# Patient Record
Sex: Female | Born: 1960
Health system: Southern US, Community
[De-identification: ages and names within clinical notes are randomized; demographics above are authoritative.]

## PROBLEM LIST (undated history)

## (undated) DIAGNOSIS — U071 COVID-19: Secondary | ICD-10-CM

## (undated) DIAGNOSIS — M069 Rheumatoid arthritis, unspecified: Secondary | ICD-10-CM

## (undated) DIAGNOSIS — M1990 Unspecified osteoarthritis, unspecified site: Secondary | ICD-10-CM

## (undated) DIAGNOSIS — C439 Malignant melanoma of skin, unspecified: Secondary | ICD-10-CM

## (undated) HISTORY — PX: SKIN BIOPSY: SHX1

## (undated) HISTORY — PX: INJECTION KNEE: SHX2446

## (undated) HISTORY — DX: COVID-19: U07.1

## (undated) HISTORY — PX: MELANOMA EXCISION: SHX5266

---

## 2000-03-13 ENCOUNTER — Encounter: Payer: Self-pay | Admitting: General Surgery

## 2000-03-13 ENCOUNTER — Ambulatory Visit (HOSPITAL_COMMUNITY): Admission: RE | Admit: 2000-03-13 | Discharge: 2000-03-13 | Payer: Self-pay | Admitting: General Surgery

## 2000-03-20 ENCOUNTER — Encounter: Admission: RE | Admit: 2000-03-20 | Discharge: 2000-03-20 | Payer: Self-pay | Admitting: General Surgery

## 2000-03-20 ENCOUNTER — Encounter: Payer: Self-pay | Admitting: General Surgery

## 2000-03-21 ENCOUNTER — Ambulatory Visit (HOSPITAL_BASED_OUTPATIENT_CLINIC_OR_DEPARTMENT_OTHER): Admission: RE | Admit: 2000-03-21 | Discharge: 2000-03-21 | Payer: Self-pay | Admitting: General Surgery

## 2000-03-21 ENCOUNTER — Encounter (INDEPENDENT_AMBULATORY_CARE_PROVIDER_SITE_OTHER): Payer: Self-pay | Admitting: *Deleted

## 2000-03-31 ENCOUNTER — Emergency Department (HOSPITAL_COMMUNITY): Admission: EM | Admit: 2000-03-31 | Discharge: 2000-03-31 | Payer: Self-pay | Admitting: Emergency Medicine

## 2000-04-17 ENCOUNTER — Encounter: Payer: Self-pay | Admitting: Oncology

## 2000-04-17 ENCOUNTER — Encounter: Admission: RE | Admit: 2000-04-17 | Discharge: 2000-04-17 | Payer: Self-pay | Admitting: Oncology

## 2000-07-17 ENCOUNTER — Other Ambulatory Visit: Admission: RE | Admit: 2000-07-17 | Discharge: 2000-07-17 | Payer: Self-pay | Admitting: Internal Medicine

## 2000-07-23 ENCOUNTER — Encounter: Admission: RE | Admit: 2000-07-23 | Discharge: 2000-07-23 | Payer: Self-pay | Admitting: Oncology

## 2000-07-23 ENCOUNTER — Encounter: Payer: Self-pay | Admitting: Oncology

## 2000-11-19 ENCOUNTER — Encounter: Admission: RE | Admit: 2000-11-19 | Discharge: 2000-11-19 | Payer: Self-pay | Admitting: Oncology

## 2000-11-19 ENCOUNTER — Encounter: Payer: Self-pay | Admitting: Oncology

## 2000-12-09 ENCOUNTER — Encounter: Admission: RE | Admit: 2000-12-09 | Discharge: 2000-12-09 | Payer: Self-pay | Admitting: Internal Medicine

## 2000-12-09 ENCOUNTER — Encounter: Payer: Self-pay | Admitting: Internal Medicine

## 2001-06-11 ENCOUNTER — Encounter: Admission: RE | Admit: 2001-06-11 | Discharge: 2001-06-11 | Payer: Self-pay | Admitting: Dermatology

## 2001-06-11 ENCOUNTER — Encounter: Payer: Self-pay | Admitting: Dermatology

## 2001-12-29 ENCOUNTER — Other Ambulatory Visit: Admission: RE | Admit: 2001-12-29 | Discharge: 2001-12-29 | Payer: Self-pay | Admitting: Obstetrics and Gynecology

## 2002-01-26 ENCOUNTER — Encounter: Admission: RE | Admit: 2002-01-26 | Discharge: 2002-01-26 | Payer: Self-pay | Admitting: Internal Medicine

## 2002-01-26 ENCOUNTER — Encounter: Payer: Self-pay | Admitting: Internal Medicine

## 2002-06-15 ENCOUNTER — Encounter: Payer: Self-pay | Admitting: Oncology

## 2002-06-15 ENCOUNTER — Encounter: Admission: RE | Admit: 2002-06-15 | Discharge: 2002-06-15 | Payer: Self-pay | Admitting: Oncology

## 2003-03-01 ENCOUNTER — Encounter: Admission: RE | Admit: 2003-03-01 | Discharge: 2003-03-01 | Payer: Self-pay | Admitting: Internal Medicine

## 2003-07-09 ENCOUNTER — Other Ambulatory Visit: Admission: RE | Admit: 2003-07-09 | Discharge: 2003-07-09 | Payer: Self-pay | Admitting: Internal Medicine

## 2004-04-05 ENCOUNTER — Encounter: Admission: RE | Admit: 2004-04-05 | Discharge: 2004-04-05 | Payer: Self-pay | Admitting: Internal Medicine

## 2004-04-05 ENCOUNTER — Encounter: Admission: RE | Admit: 2004-04-05 | Discharge: 2004-04-05 | Payer: Self-pay | Admitting: Rheumatology

## 2004-07-10 ENCOUNTER — Other Ambulatory Visit: Admission: RE | Admit: 2004-07-10 | Discharge: 2004-07-10 | Payer: Self-pay | Admitting: Internal Medicine

## 2005-04-29 ENCOUNTER — Emergency Department (HOSPITAL_COMMUNITY): Admission: EM | Admit: 2005-04-29 | Discharge: 2005-04-29 | Payer: Self-pay | Admitting: Family Medicine

## 2005-05-21 ENCOUNTER — Encounter: Admission: RE | Admit: 2005-05-21 | Discharge: 2005-05-21 | Payer: Self-pay | Admitting: Internal Medicine

## 2006-05-29 ENCOUNTER — Encounter: Admission: RE | Admit: 2006-05-29 | Discharge: 2006-05-29 | Payer: Self-pay | Admitting: Obstetrics and Gynecology

## 2007-06-02 ENCOUNTER — Encounter: Admission: RE | Admit: 2007-06-02 | Discharge: 2007-06-02 | Payer: Self-pay | Admitting: Obstetrics and Gynecology

## 2008-06-02 ENCOUNTER — Encounter: Admission: RE | Admit: 2008-06-02 | Discharge: 2008-06-02 | Payer: Self-pay | Admitting: Obstetrics and Gynecology

## 2009-06-06 ENCOUNTER — Encounter: Admission: RE | Admit: 2009-06-06 | Discharge: 2009-06-06 | Payer: Self-pay | Admitting: Obstetrics and Gynecology

## 2009-06-08 ENCOUNTER — Ambulatory Visit (HOSPITAL_COMMUNITY): Admission: RE | Admit: 2009-06-08 | Discharge: 2009-06-08 | Payer: Self-pay | Admitting: Rheumatology

## 2010-07-28 NOTE — Op Note (Signed)
. [facility]  Patient:    Kara Campbell, Kara Campbell                        MRN: [id number] Proc. Date: 03/21/00 Attending:  Rose Phi. Maple Hudson, M.D. CC:         Darius Bump, M.D.  Deanna Artis. Lorenso Quarry, M.D.   Operative Report  PREOPERATIVE DIAGNOSIS:  A 1.9 mm thick melanoma, superficial spreading, in the triceps area of the left upper extremity.  POSTOPERATIVE DIAGNOSIS:  A 1.9 mm thick melanoma, superficial spreading, in the triceps area of the left upper extremity.  PROCEDURES: 1. Blue dye injection. 2. Left axillary sentinel lymph node biopsy. 3. Wide excision of melanoma with layered closure.  SURGEON:  Rose Phi. Maple Hudson, M.D.  ANESTHESIA:  General.  DESCRIPTION OF PROCEDURE:  Two hours preoperatively, the patient had 1 mCi of Technetium sulfur colloid injected intradermally around the melanoma of the triceps area of the left upper extremity.  After suitable general anesthesia was induced, about 0.5 cc of lymphazurin blue was injected intradermally around the melanoma, and then we prepped out the breast, axilla, and the whole upper extremity.  With the NeoProbe, we were able to identify a hot spot in the left axilla where the preoperative lymphoscintigram had identified a sentinel lymph node.  A short transverse incision was made in the left axilla and with dissection through the subcutaneous tissue to the clavipectoral fascia.  A self-retaining retractor was inserted.  The clavipectoral fascia was incised, and we could see a blue lymphatic leading to a pink node with blue right where the lymphatic went into it.  This node was also quite hot with the NeoProbe.  It was removed and then careful scanning and palpation revealed no other blue or hot or palpable lymph nodes in the axilla.  With that node removed, we closed the deep tissue with 3-0 Vicryl and the skin with subcuticular 4-0 Monocryl and Steri-Strips.  We repositioned the arm so we  could get to where the lesion was and measured a 1.5-2 cm margin around it and made a vertical elliptical incision and excised the melanoma in the subcutaneous tissue down to the triceps fascia. Hemostasis obtained with the cautery.  The deeper tissue was then closed with 3-0 Vicryl and the skin with interrupted 4-0 nylon sutures.  A dressing was applied.  Patient transferred to the recovery room in satisfactory condition, having tolerated the procedure well. DD:  03/21/00 TD:  03/21/00 Job: [id number]

## 2012-05-14 ENCOUNTER — Emergency Department (HOSPITAL_COMMUNITY)
Admission: EM | Admit: 2012-05-14 | Discharge: 2012-05-14 | Disposition: A | Discharge status: Home / Self Care | Payer: 59 | Source: Home / Self Care

## 2012-05-14 ENCOUNTER — Encounter (HOSPITAL_COMMUNITY): Payer: Self-pay

## 2012-05-14 DIAGNOSIS — H6120 Impacted cerumen, unspecified ear: Secondary | ICD-10-CM

## 2012-05-14 DIAGNOSIS — H6122 Impacted cerumen, left ear: Secondary | ICD-10-CM

## 2012-05-14 HISTORY — DX: Malignant melanoma of skin, unspecified: C43.9

## 2012-05-14 HISTORY — DX: Unspecified osteoarthritis, unspecified site: M19.90

## 2012-05-14 NOTE — ED Provider Notes (Signed)
History     CSN: [id number]  Arrival date & time 05/14/12  1002   First MD Initiated Contact with Patient 05/14/12 1022      Chief Complaint  Patient presents with  . Cerumen Impaction    (Consider location/radiation/quality/duration/timing/severity/associated sxs/prior treatment) HPI Comments: 52 year old female presents with a single complained of wax in the left ear for 5 days. This is produced decreased hearing in the left ear only. She denies symptoms of the right year. Her review systems is otherwise negative she does not feel he will she has no pain, fever, chills, weakness or malaise. She states that she feels quite well.  The history is provided by the patient.    Past Medical History  Diagnosis Date  . Melanoma rhe  . Arthritis     History reviewed. No pertinent past surgical history.  History reviewed. No pertinent family history.  History  Substance Use Topics  . Smoking status: Not on file  . Smokeless tobacco: Not on file  . Alcohol Use: Not on file    OB History   Grav Para Term Preterm Abortions TAB SAB Ect Mult Living                  Review of Systems  All other systems reviewed and are negative.    Allergies  Amoxil  Home Medications   Current Outpatient Rx  Name  Route  Sig  Dispense  Refill  . folic acid (FOLVITE) 1 MG tablet   Oral   Take 1 mg by mouth daily.         . hydroxychloroquine (PLAQUENIL) 200 MG tablet   Oral   Take 200 mg by mouth daily.         . sertraline (ZOLOFT) 25 MG tablet   Oral   Take 25 mg by mouth daily.           BP 177/85  Pulse 70  Temp(Src) 98 F (36.7 C) (Oral)  SpO2 100%  Physical Exam  Constitutional: She is oriented to person, place, and time. She appears well-developed and well-nourished. No distress.  HENT:  Mouth/Throat: Oropharynx is clear and moist.  Left EAC impacted with cerumen. Right EAC is clear TM is normal.  Eyes: Conjunctivae and EOM are normal.  Neck: Normal range  of motion. Neck supple.  Pulmonary/Chest: Effort normal.  Musculoskeletal: Normal range of motion. She exhibits no edema.  Neurological: She is alert and oriented to person, place, and time. She exhibits normal muscle tone.  Skin: Skin is warm and dry.  Psychiatric: She has a normal mood and affect.    ED Course  Procedures (including critical care time)  Labs Reviewed - No data to display No results found.   1. Cerumen impaction, left       MDM  Left ear irrigation till clear. Post irrigation reveals a clear EAC. TM is normal in appearance without erythema or effusion. She is denying pain in the area. States that she is feeling much better.        Hayden Rasmussen, NP 05/14/12 1101  Hayden Rasmussen, NP 05/14/12 1102

## 2012-05-14 NOTE — ED Notes (Signed)
Discomfort, hearing loss in left ear for past few days

## 2012-05-29 ENCOUNTER — Emergency Department (HOSPITAL_COMMUNITY)
Admission: EM | Admit: 2012-05-29 | Discharge: 2012-05-29 | Disposition: A | Discharge status: Home / Self Care | Payer: 59 | Source: Home / Self Care | Attending: Emergency Medicine | Admitting: Emergency Medicine

## 2012-05-29 ENCOUNTER — Encounter (HOSPITAL_COMMUNITY): Payer: Self-pay | Admitting: *Deleted

## 2012-05-29 DIAGNOSIS — J019 Acute sinusitis, unspecified: Secondary | ICD-10-CM

## 2012-05-29 DIAGNOSIS — J209 Acute bronchitis, unspecified: Secondary | ICD-10-CM

## 2012-05-29 MED ORDER — HYDROCOD POLST-CHLORPHEN POLST 10-8 MG/5ML PO LQCR: 5.0000 mL | Freq: Two times a day (BID) | ORAL | Status: DC | PRN

## 2012-05-29 MED ORDER — MOXIFLOXACIN HCL 400 MG PO TABS: 400.0000 mg | ORAL_TABLET | Freq: Every day | ORAL | Status: DC

## 2012-05-29 NOTE — ED Notes (Signed)
Pt reports URI infection for one week with no relief from otc meds - cough, congestion, sneezing

## 2012-05-29 NOTE — ED Provider Notes (Signed)
Chief Complaint:   Chief Complaint  Patient presents with  . 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 She's had a one-week history of cough productive yellow sputum, slight wheezing, and slight chest pain. She's had nasal congestion with clear drainage, sneezing, headache, ear pressure, low-grade fever of not over 100, and sore throat. She denies any GI symptoms.  Review of Systems:  Other than noted above, the patient denies any of the following symptoms: Systemic:  No fevers, chills, sweats, weight loss or gain, fatigue, or tiredness. Eye:  No redness or discharge. ENT:  No ear pain, drainage, headache, nasal congestion, drainage, sinus pressure, difficulty swallowing, or sore throat. Neck:  No neck pain or swollen glands. Lungs:  No cough, sputum production, hemoptysis, wheezing, chest tightness, shortness of breath or chest pain. GI:  No abdominal pain, nausea, vomiting or diarrhea.  PMFSH:  Past medical history, family history, social history, meds, and allergies were reviewed. Physical Exam:   Vital signs:  BP 130/84  Pulse 62  Temp(Src) 98.1 F (36.7 C) (Oral)  Resp 18  SpO2 95% General:  Alert and oriented.  In no distress.  Skin warm and dry. Eye:  No conjunctival injection or drainage. Lids were normal. ENT:  TMs and canals were normal, without erythema or inflammation.  Nasal mucosa was clear and uncongested, without drainage.  Mucous membranes were moist.  Pharynx was clear with no exudate or drainage.  There were no oral ulcerations or lesions. Neck:  Supple, no adenopathy, tenderness or mass. Lungs:  No respiratory distress.  Lungs were clear to auscultation, without wheezes, rales or rhonchi.  Breath sounds were clear and equal bilaterally.  Heart:  Regular rhythm, without gallops, murmers or rubs. Skin:  Clear, warm, and dry, without rash or lesions.  Assessment:  The primary encounter diagnosis  was Acute bronchitis. A diagnosis of Acute sinusitis was also pertinent to this visit.  Plan:   1.  The following meds were prescribed:   Discharge Medication List as of 05/29/2012 10:31 AM    START taking these medications   Details  chlorpheniramine-HYDROcodone (TUSSIONEX) 10-8 MG/5ML LQCR Take 5 mLs by mouth every 12 (twelve) hours as needed., Starting 05/29/2012, Until Discontinued, Normal    moxifloxacin (AVELOX) 400 MG tablet Take 1 tablet (400 mg total) by mouth daily., Starting 05/29/2012, Until Discontinued, Normal       2.  The patient was instructed in symptomatic care and handouts were given. 3.  The patient was told to return if becoming worse in any way, if no better in 3 or 4 days, and given some red flag symptoms such as fever, difficulty breathing, chest pain, or intractable vomiting that would indicate earlier return.      Reuben Likes, MD 05/29/12 [phone]

## 2012-06-08 ENCOUNTER — Emergency Department (INDEPENDENT_AMBULATORY_CARE_PROVIDER_SITE_OTHER): Payer: 59

## 2012-06-08 ENCOUNTER — Encounter (HOSPITAL_COMMUNITY): Payer: Self-pay | Admitting: *Deleted

## 2012-06-08 ENCOUNTER — Emergency Department (HOSPITAL_COMMUNITY)
Admission: EM | Admit: 2012-06-08 | Discharge: 2012-06-08 | Disposition: A | Discharge status: Home / Self Care | Payer: 59 | Source: Home / Self Care

## 2012-06-08 DIAGNOSIS — J9801 Acute bronchospasm: Secondary | ICD-10-CM

## 2012-06-08 HISTORY — DX: Rheumatoid arthritis, unspecified: M06.9

## 2012-06-08 MED ORDER — SODIUM CHLORIDE 0.9 % IV SOLN: Freq: Once | INTRAVENOUS | Status: DC

## 2012-06-08 MED ORDER — BECLOMETHASONE DIPROPIONATE 40 MCG/ACT IN AERS: 2.0000 | INHALATION_SPRAY | Freq: Two times a day (BID) | RESPIRATORY_TRACT | Status: DC

## 2012-06-08 MED ORDER — IPRATROPIUM BROMIDE 0.02 % IN SOLN
0.5000 mg | Freq: Once | RESPIRATORY_TRACT | Status: AC
  Administered 2012-06-08: 0.5 mg via RESPIRATORY_TRACT

## 2012-06-08 MED ORDER — ALBUTEROL SULFATE (5 MG/ML) 0.5% IN NEBU
INHALATION_SOLUTION | RESPIRATORY_TRACT | Status: AC
  Filled 2012-06-08: qty 1

## 2012-06-08 MED ORDER — ALBUTEROL SULFATE HFA 108 (90 BASE) MCG/ACT IN AERS: 1.0000 | INHALATION_SPRAY | Freq: Four times a day (QID) | RESPIRATORY_TRACT | Status: DC | PRN

## 2012-06-08 MED ORDER — ALBUTEROL SULFATE (5 MG/ML) 0.5% IN NEBU
5.0000 mg | INHALATION_SOLUTION | Freq: Once | RESPIRATORY_TRACT | Status: AC
  Administered 2012-06-08: 5 mg via RESPIRATORY_TRACT

## 2012-06-08 NOTE — ED Provider Notes (Signed)
History     CSN: [id number]  Arrival date & time 06/08/12  1155   First MD Initiated Contact with Patient 06/08/12 1430      Chief Complaint  Patient presents with  . Cough    (Consider location/radiation/quality/duration/timing/severity/associated sxs/prior treatment) HPI Comments: As above. The patient states she does feel better after taking the Avelox. Her cough however is increasing. It is nonproductive. She denies having fever but occasionally feels hot. Her PMD has also decreased. She has taken all of her Avelox and the Tussionex is not abating the cough.   Past Medical History  Diagnosis Date  . Melanoma rhe  . Arthritis   . Rheumatoid arthritis     No past surgical history on file.  Family History  Problem Relation Age of Onset  . Rheum arthritis Other     History  Substance Use Topics  . Smoking status: Never Smoker   . Smokeless tobacco: Not on file  . Alcohol Use: Yes     Comment: social    OB History   Grav Para Term Preterm Abortions TAB SAB Ect Mult Living                  Review of Systems  Constitutional: Negative for fever, chills, activity change, appetite change and fatigue.  HENT: Positive for congestion and postnasal drip. Negative for facial swelling, rhinorrhea, neck pain and neck stiffness.   Eyes: Negative.   Respiratory: Positive for cough. Negative for shortness of breath.   Cardiovascular: Negative.   Gastrointestinal: Negative.   Skin: Negative for pallor and rash.  Neurological: Negative.   Psychiatric/Behavioral: Negative.     Allergies  Amoxil  Home Medications   Current Outpatient Rx  Name  Route  Sig  Dispense  Refill  . chlorpheniramine-HYDROcodone (TUSSIONEX) 10-8 MG/5ML LQCR   Oral   Take 5 mLs by mouth every 12 (twelve) hours as needed.   140 mL   0   . folic acid (FOLVITE) 1 MG tablet   Oral   Take 1 mg by mouth daily.         . hydroxychloroquine (PLAQUENIL) 200 MG tablet   Oral   Take 200 mg by  mouth daily.         Marland Kitchen albuterol (PROVENTIL HFA;VENTOLIN HFA) 108 (90 BASE) MCG/ACT inhaler   Inhalation   Inhale 1-2 puffs into the lungs every 6 (six) hours as needed for wheezing.   1 Inhaler   0   . beclomethasone (QVAR) 40 MCG/ACT inhaler   Inhalation   Inhale 2 puffs into the lungs 2 (two) times daily.   1 Inhaler   1   . moxifloxacin (AVELOX) 400 MG tablet   Oral   Take 1 tablet (400 mg total) by mouth daily.   10 tablet   0   . sertraline (ZOLOFT) 25 MG tablet   Oral   Take 25 mg by mouth daily.           BP 141/92  Pulse 62  Temp(Src) 98.2 F (36.8 C) (Oral)  Resp 18  SpO2 100%  Physical Exam  Nursing note and vitals reviewed. Constitutional: She is oriented to person, place, and time. She appears well-developed and well-nourished. No distress.  HENT:  Right Ear: External ear normal.  Left Ear: External ear normal.  Mouth/Throat: Oropharynx is clear and moist. No oropharyngeal exudate.  Eyes: Conjunctivae and EOM are normal.  Neck: Normal range of motion. Neck supple.  Cardiovascular: Normal rate and  regular rhythm.   Pulmonary/Chest: Effort normal. No respiratory distress. She exhibits no tenderness.  End expiratory wheeze with forced expiration and cough. No Rales  Musculoskeletal: Normal range of motion. She exhibits no edema.  Lymphadenopathy:    She has no cervical adenopathy.  Neurological: She is alert and oriented to person, place, and time.  Skin: Skin is warm and dry. No rash noted.  Psychiatric: She has a normal mood and affect.    ED Course  Procedures (including critical care time)  Labs Reviewed - No data to display Dg Chest 2 View  06/08/2012  *RADIOLOGY REPORT*  Clinical Data: Cough  CHEST - 2 VIEW  Comparison:  06/08/2009  Findings:  The heart size and mediastinal contours are within normal limits.  Both lungs are clear.  The visualized skeletal structures are unremarkable.  IMPRESSION: No active cardiopulmonary disease.    Original Report Authenticated By: Judie Petit. Shick, M.D.      1. Cough due to bronchospasm       MDM  Patient states he feels some improvement post DuoNeb. She has continues to have a dry cough but not quite as much. She is breathing well. Albuterol HFA 2 puffs every 4-6 hours when necessary cough Qvar 40 puffs twice a day Followup with your primary care doctor later this week.        Hayden Rasmussen, NP 06/08/12 1527

## 2012-06-08 NOTE — ED Notes (Signed)
Breathing treatment in progress

## 2012-06-08 NOTE — ED Notes (Signed)
To XR dept.

## 2012-06-08 NOTE — ED Notes (Signed)
Was seen in [facility] 3/20 for sinusitis and bronchitis.  Completed Avalox abx yesterday, but feels she has not improved at all.  Feels feverish in evenings.  Has mostly dry cough, but occasionally productive in morning.  Continues taking Rx cough med.  Faint squeak noted upon ausculation of left mid lung.

## 2012-06-11 NOTE — ED Provider Notes (Signed)
Medical screening examination/treatment/procedure(s) were performed by resident physician or non-physician practitioner and as supervising physician I was immediately available for consultation/collaboration.   Donae Kueker DOUGLAS MD.   Megahn Killings D Ranata Laughery, MD 06/11/12 1946 

## 2013-04-16 ENCOUNTER — Other Ambulatory Visit (HOSPITAL_COMMUNITY): Payer: Self-pay | Admitting: Obstetrics and Gynecology

## 2013-04-16 DIAGNOSIS — Z803 Family history of malignant neoplasm of breast: Secondary | ICD-10-CM

## 2013-04-23 ENCOUNTER — Ambulatory Visit (HOSPITAL_COMMUNITY): Admission: RE | Admit: 2013-04-23 | Payer: 59 | Source: Ambulatory Visit

## 2013-04-23 ENCOUNTER — Other Ambulatory Visit: Payer: Self-pay | Admitting: Obstetrics and Gynecology

## 2013-04-23 ENCOUNTER — Ambulatory Visit
Admission: RE | Admit: 2013-04-23 | Discharge: 2013-04-23 | Disposition: A | Discharge status: Home / Self Care | Payer: 59 | Source: Ambulatory Visit | Attending: Obstetrics and Gynecology | Admitting: Obstetrics and Gynecology

## 2013-04-23 ENCOUNTER — Ambulatory Visit
Admission: RE | Admit: 2013-04-23 | Discharge: 2013-04-23 | Disposition: A | Discharge status: Home / Self Care | Payer: Self-pay | Source: Ambulatory Visit | Attending: Obstetrics and Gynecology | Admitting: Obstetrics and Gynecology

## 2013-04-23 DIAGNOSIS — N63 Unspecified lump in unspecified breast: Secondary | ICD-10-CM

## 2013-10-07 ENCOUNTER — Encounter: Payer: Self-pay | Admitting: *Deleted

## 2015-03-26 DIAGNOSIS — S92505A Nondisplaced unspecified fracture of left lesser toe(s), initial encounter for closed fracture: Secondary | ICD-10-CM | POA: Diagnosis not present

## 2015-04-21 MED FILL — SERTRALINE HCL 100 MG TAB: 100 | 30 days supply | Qty: 30 | Fill #5

## 2015-05-26 DIAGNOSIS — H524 Presbyopia: Secondary | ICD-10-CM | POA: Diagnosis not present

## 2015-05-26 DIAGNOSIS — Z79899 Other long term (current) drug therapy: Secondary | ICD-10-CM | POA: Diagnosis not present

## 2015-05-27 MED FILL — SERTRALINE HCL 100 MG TAB: 100 | 30 days supply | Qty: 30 | Fill #6

## 2015-05-27 MED FILL — HYDROXYCHLOROQUINE 200 MG T: 200 | 30 days supply | Qty: 45 | Fill #0

## 2015-06-02 DIAGNOSIS — Z09 Encounter for follow-up examination after completed treatment for conditions other than malignant neoplasm: Secondary | ICD-10-CM | POA: Diagnosis not present

## 2015-06-02 DIAGNOSIS — M19271 Secondary osteoarthritis, right ankle and foot: Secondary | ICD-10-CM | POA: Diagnosis not present

## 2015-06-02 DIAGNOSIS — M205X1 Other deformities of toe(s) (acquired), right foot: Secondary | ICD-10-CM | POA: Diagnosis not present

## 2015-06-02 DIAGNOSIS — M0579 Rheumatoid arthritis with rheumatoid factor of multiple sites without organ or systems involvement: Secondary | ICD-10-CM | POA: Diagnosis not present

## 2015-06-02 DIAGNOSIS — Z79899 Other long term (current) drug therapy: Secondary | ICD-10-CM | POA: Diagnosis not present

## 2015-06-07 ENCOUNTER — Other Ambulatory Visit: Payer: Self-pay | Admitting: *Deleted

## 2015-06-07 ENCOUNTER — Ambulatory Visit: Payer: 59

## 2015-06-07 DIAGNOSIS — R0989 Other specified symptoms and signs involving the circulatory and respiratory systems: Secondary | ICD-10-CM

## 2015-06-07 LAB — INFLUENZA A AND B
INFLUENZA B AG, EIA: NEGATIVE
Influenza A Ag, EIA: NEGATIVE

## 2015-06-15 ENCOUNTER — Ambulatory Visit (INDEPENDENT_AMBULATORY_CARE_PROVIDER_SITE_OTHER): Payer: 59 | Admitting: Urgent Care

## 2015-06-15 VITALS — BP 138/94 | HR 65 | Temp 97.8°F | Resp 17 | Ht 65.5 in | Wt 150.0 lb

## 2015-06-15 DIAGNOSIS — R059 Cough, unspecified: Secondary | ICD-10-CM

## 2015-06-15 DIAGNOSIS — R0982 Postnasal drip: Secondary | ICD-10-CM

## 2015-06-15 DIAGNOSIS — J329 Chronic sinusitis, unspecified: Secondary | ICD-10-CM | POA: Diagnosis not present

## 2015-06-15 DIAGNOSIS — I10 Essential (primary) hypertension: Secondary | ICD-10-CM

## 2015-06-15 DIAGNOSIS — H00016 Hordeolum externum left eye, unspecified eyelid: Secondary | ICD-10-CM

## 2015-06-15 DIAGNOSIS — R05 Cough: Secondary | ICD-10-CM

## 2015-06-15 MED ORDER — HYDROCODONE-HOMATROPINE 5-1.5 MG/5ML PO SYRP: 5.0000 mL | ORAL_SOLUTION | Freq: Every evening | ORAL | Status: DC | PRN

## 2015-06-15 MED ORDER — HYDROCHLOROTHIAZIDE 12.5 MG PO TABS: 12.5000 mg | ORAL_TABLET | Freq: Every day | ORAL | Status: DC

## 2015-06-15 MED ORDER — DOXYCYCLINE HYCLATE 100 MG PO CAPS
100.0000 mg | ORAL_CAPSULE | Freq: Two times a day (BID) | ORAL | Status: DC
Start: 2015-06-15 — End: 2016-02-23

## 2015-06-15 MED ORDER — CETIRIZINE HCL 10 MG PO TABS: 10.0000 mg | ORAL_TABLET | Freq: Every day | ORAL | Status: DC

## 2015-06-15 MED FILL — HYDROCODONE-HOMATROPINE SYR: 5-1.5 | 24 days supply | Qty: 120 | Fill #0

## 2015-06-15 MED FILL — predniSONE 5 MG TABS: 5 | 20 days supply | Qty: 42 | Fill #0

## 2015-06-15 MED FILL — HYDROCHLOROTHIAZIDE 12.5 MG: 12.5 | 90 days supply | Qty: 90 | Fill #0

## 2015-06-15 NOTE — Patient Instructions (Addendum)
Cough, Adult Coughing is a reflex that clears your throat and your airways. Coughing helps to heal and protect your lungs. It is normal to cough occasionally, but a cough that happens with other symptoms or lasts a long time may be a sign of a condition that needs treatment. A cough may last only 2-3 weeks (acute), or it may last longer than 8 weeks (chronic). CAUSES Coughing is commonly caused by:  Breathing in substances that irritate your lungs.  A viral or bacterial respiratory infection.  Allergies.  Asthma.  Postnasal drip.  Smoking.  Acid backing up from the stomach into the esophagus (gastroesophageal reflux).  Certain medicines.  Chronic lung problems, including COPD (or rarely, lung cancer).  Other medical conditions such as heart failure. HOME CARE INSTRUCTIONS  Pay attention to any changes in your symptoms. Take these actions to help with your discomfort:  Take medicines only as told by your health care provider.  If you were prescribed an antibiotic medicine, take it as told by your health care provider. Do not stop taking the antibiotic even if you start to feel better.  Talk with your health care provider before you take a cough suppressant medicine.  Drink enough fluid to keep your urine clear or pale yellow.  If the air is dry, use a cold steam vaporizer or humidifier in your bedroom or your home to help loosen secretions.  Avoid anything that causes you to cough at work or at home.  If your cough is worse at night, try sleeping in a semi-upright position.  Avoid cigarette smoke. If you smoke, quit smoking. If you need help quitting, ask your health care provider.  Avoid caffeine.  Avoid alcohol.  Rest as needed. SEEK MEDICAL CARE IF:   You have new symptoms.  You cough up pus.  Your cough does not get better after 2-3 weeks, or your cough gets worse.  You cannot control your cough with suppressant medicines and you are losing sleep.  You  develop pain that is getting worse or pain that is not controlled with pain medicines.  You have a fever.  You have unexplained weight loss.  You have night sweats. SEEK IMMEDIATE MEDICAL CARE IF:  You cough up blood.  You have difficulty breathing.  Your heartbeat is very fast.   This information is not intended to replace advice given to you by your health care provider. Make sure you discuss any questions you have with your health care provider.   Document Released: 08/25/2010 Document Revised: 11/17/2014 Document Reviewed: 05/05/2014 Elsevier Interactive Patient Education 2016 [employer].    Hypertension Hypertension, commonly called high blood pressure, is when the force of blood pumping through your arteries is too strong. Your arteries are the blood vessels that carry blood from your heart throughout your body. A blood pressure reading consists of a higher number over a lower number, such as 110/72. The higher number (systolic) is the pressure inside your arteries when your heart pumps. The lower number (diastolic) is the pressure inside your arteries when your heart relaxes. Ideally you want your blood pressure below 120/80. Hypertension forces your heart to work harder to pump blood. Your arteries may become narrow or stiff. Having untreated or uncontrolled hypertension can cause heart attack, stroke, kidney disease, and other problems. RISK FACTORS Some risk factors for high blood pressure are controllable. Others are not.  Risk factors you cannot control include:   Race. You may be at higher risk if you are African  American.  Age. Risk increases with age.  Gender. Men are at higher risk than women before age 70 years. After age 19, women are at higher risk than men. Risk factors you can control include:  Not getting enough exercise or physical activity.  Being overweight.  Getting too much fat, sugar, calories, or salt in your diet.  Drinking too much  alcohol. SIGNS AND SYMPTOMS Hypertension does not usually cause signs or symptoms. Extremely high blood pressure (hypertensive crisis) may cause headache, anxiety, shortness of breath, and nosebleed. DIAGNOSIS To check if you have hypertension, your health care provider will measure your blood pressure while you are seated, with your arm held at the level of your heart. It should be measured at least twice using the same arm. Certain conditions can cause a difference in blood pressure between your right and left arms. A blood pressure reading that is higher than normal on one occasion does not mean that you need treatment. If it is not clear whether you have high blood pressure, you may be asked to return on a different day to have your blood pressure checked again. Or, you may be asked to monitor your blood pressure at home for 1 or more weeks. TREATMENT Treating high blood pressure includes making lifestyle changes and possibly taking medicine. Living a healthy lifestyle can help lower high blood pressure. You may need to change some of your habits. Lifestyle changes may include:  Following the DASH diet. This diet is high in fruits, vegetables, and whole grains. It is low in salt, red meat, and added sugars.  Keep your sodium intake below 2,300 mg per day.  Getting at least 30-45 minutes of aerobic exercise at least 4 times per week.  Losing weight if necessary.  Not smoking.  Limiting alcoholic beverages.  Learning ways to reduce stress. Your health care provider may prescribe medicine if lifestyle changes are not enough to get your blood pressure under control, and if one of the following is true:  You are 16-6 years of age and your systolic blood pressure is above 140.  You are 3 years of age or older, and your systolic blood pressure is above 150.  Your diastolic blood pressure is above 90.  You have diabetes, and your systolic blood pressure is over [id number] or your diastolic blood  pressure is over 90.  You have kidney disease and your blood pressure is above 140/90.  You have heart disease and your blood pressure is above 140/90. Your personal target blood pressure may vary depending on your medical conditions, your age, and other factors. HOME CARE INSTRUCTIONS  Have your blood pressure rechecked as directed by your health care provider.   Take medicines only as directed by your health care provider. Follow the directions carefully. Blood pressure medicines must be taken as prescribed. The medicine does not work as well when you skip doses. Skipping doses also puts you at risk for problems.  Do not smoke.   Monitor your blood pressure at home as directed by your health care provider. SEEK MEDICAL CARE IF:   You think you are having a reaction to medicines taken.  You have recurrent headaches or feel dizzy.  You have swelling in your ankles.  You have trouble with your vision. SEEK IMMEDIATE MEDICAL CARE IF:  You develop a severe headache or confusion.  You have unusual weakness, numbness, or feel faint.  You have severe chest or abdominal pain.  You vomit repeatedly.  You have trouble  breathing. MAKE SURE YOU:   Understand these instructions.  Will watch your condition.  Will get help right away if you are not doing well or get worse.   This information is not intended to replace advice given to you by your health care provider. Make sure you discuss any questions you have with your health care provider.   Document Released: 02/26/2005 Document Revised: 07/13/2014 Document Reviewed: 12/19/2012 Elsevier Interactive Patient Education 2016 [employer].     IF you received an x-ray today, you will receive an invoice from [facility]. Please contact [facility] at [phone] with questions or concerns regarding your invoice.   IF you received labwork today, you will receive an invoice from [facility]. Please contact [facility] at [phone] with questions or concerns regarding your invoice.   Our billing staff will not be able to assist you with questions regarding bills from these companies.  You will be contacted with the lab results as soon as they are available. The fastest way to get your results is to activate your My Chart account. Instructions are located on the last page of this paperwork. If you have not heard from [place] regarding the results in 2 weeks, please contact this office.

## 2015-06-15 NOTE — Progress Notes (Signed)
    MRN: [id number] DOB: Nov 16, 1960  Subjective:   Kara Campbell is a 55 y.o. female presenting for chief complaint of Cough; Shortness of Breath; Laryngitis; and chest tightness  Reports 2 week history of dry cough, cough elicits chest tightness, mild wheezing and shob. Has also had sore throat, post-nasal drainage, subjective fever. She is trying Mucinex every four hours, Robitussin at night. Patient works at the [employer] and wants to make sure she does not pass along any infections. Denies history of allergies or asthma. Denies chest pain, n/v, abdominal pain, nasal congestion, sinus pain. Denies smoking cigarettes. Drinks ~4 alcohol drinks per week. Has a history of elevated blood pressure readings without diagnosis HTN. Has never previously started BP medication.  Kara Campbell has a current medication list which includes the following prescription(s): folic acid, hydroxychloroquine, and sertraline. Also is allergic to amoxil.  Kara Campbell  has a past medical history of Melanoma ([place]) (rhe); Arthritis; and Rheumatoid arthritis(714.0). Also  has past surgical history that includes Melanoma excision.  Her family history includes Cancer in her mother; Rheum arthritis in her other. Melanoma in her father.  Objective:   Vitals: BP 138/94 mmHg  Pulse 65  Temp(Src) 97.8 F (36.6 C) (Oral)  Resp 17  Ht 5' 5.5" (1.664 m)  Wt 150 lb (68.04 kg)  BMI 24.57 kg/m2  SpO2 98%  BP Readings from Last 3 Encounters:  06/15/15 138/94  06/08/12 141/92  05/29/12 130/84   Physical Exam  Constitutional: She is oriented to person, place, and time. She appears well-developed and well-nourished.  HENT:  TM's intact bilaterally, no effusions or erythema. Nasal turbinates pink and moist. Mild bilateral maxillary sinus tenderness. Slight postnasal drip present, without oropharyngeal exudates, erythema or abscesses.   Eyes: Right eye exhibits no discharge. Left eye exhibits hordeolum (<0.5cm area of erythema  over inner lower eyelid). Left eye exhibits no discharge. No scleral icterus.  Neck: Normal range of motion. Neck supple.  Cardiovascular: Normal rate, regular rhythm and intact distal pulses.  Exam reveals no gallop and no friction rub.   No murmur heard. Pulmonary/Chest: No respiratory distress. She has no wheezes. She has no rales.  Lymphadenopathy:    She has no cervical adenopathy.  Neurological: She is alert and oriented to person, place, and time.  Skin: Skin is warm and dry.   Assessment and Plan :   1. Cough 2. Post-nasal drainage - Stop Mucinex, start Zyrtec. Use Hycodan as needed. Likely allergic. Provided patient with script for doxycycline which she is to fill in case she has no improvement in 4-5 days. Patient agreed.  3. Essential hypertension - Start HCTZ today, f/u with PCP.  4. Stye, left - Advised warm compresses, rtc if no improvement.   Jaynee Eagles, PA-C [facility] [phone] 06/15/2015 9:33 AM

## 2015-07-04 DIAGNOSIS — Z85828 Personal history of other malignant neoplasm of skin: Secondary | ICD-10-CM | POA: Diagnosis not present

## 2015-07-04 DIAGNOSIS — C44612 Basal cell carcinoma of skin of right upper limb, including shoulder: Secondary | ICD-10-CM | POA: Diagnosis not present

## 2015-07-04 DIAGNOSIS — L089 Local infection of the skin and subcutaneous tissue, unspecified: Secondary | ICD-10-CM | POA: Diagnosis not present

## 2015-07-04 DIAGNOSIS — Z85820 Personal history of malignant melanoma of skin: Secondary | ICD-10-CM | POA: Diagnosis not present

## 2015-07-04 MED FILL — MUPIROCIN 2% OINTMENT: 2 | 10 days supply | Qty: 22 | Fill #0

## 2015-07-14 DIAGNOSIS — D485 Neoplasm of uncertain behavior of skin: Secondary | ICD-10-CM | POA: Diagnosis not present

## 2015-07-14 DIAGNOSIS — C4441 Basal cell carcinoma of skin of scalp and neck: Secondary | ICD-10-CM | POA: Diagnosis not present

## 2015-07-14 DIAGNOSIS — L570 Actinic keratosis: Secondary | ICD-10-CM | POA: Diagnosis not present

## 2015-07-27 MED FILL — HYDROXYCHLOROQUINE 200 MG T: 200 | 84 days supply | Qty: 120 | Fill #0

## 2015-07-28 MED FILL — SERTRALINE HCL 50 MG TABLET: 50 | 30 days supply | Qty: 30 | Fill #0

## 2015-08-02 DIAGNOSIS — L905 Scar conditions and fibrosis of skin: Secondary | ICD-10-CM | POA: Diagnosis not present

## 2015-08-02 DIAGNOSIS — Z85820 Personal history of malignant melanoma of skin: Secondary | ICD-10-CM | POA: Diagnosis not present

## 2015-08-02 DIAGNOSIS — Z85828 Personal history of other malignant neoplasm of skin: Secondary | ICD-10-CM | POA: Diagnosis not present

## 2015-08-02 DIAGNOSIS — C4441 Basal cell carcinoma of skin of scalp and neck: Secondary | ICD-10-CM | POA: Diagnosis not present

## 2015-08-17 DIAGNOSIS — Z1239 Encounter for other screening for malignant neoplasm of breast: Secondary | ICD-10-CM | POA: Diagnosis not present

## 2015-08-17 DIAGNOSIS — Z01419 Encounter for gynecological examination (general) (routine) without abnormal findings: Secondary | ICD-10-CM | POA: Diagnosis not present

## 2015-08-17 DIAGNOSIS — Z1231 Encounter for screening mammogram for malignant neoplasm of breast: Secondary | ICD-10-CM | POA: Diagnosis not present

## 2015-08-17 DIAGNOSIS — Z6826 Body mass index (BMI) 26.0-26.9, adult: Secondary | ICD-10-CM | POA: Diagnosis not present

## 2015-09-06 MED FILL — SERTRALINE HCL 100 MG TAB: 100 | 30 days supply | Qty: 30 | Fill #0

## 2015-11-02 MED FILL — SERTRALINE HCL 100 MG TAB: 100 | 30 days supply | Qty: 30 | Fill #1

## 2015-11-02 MED FILL — HYDROCHLOROTHIAZIDE 12.5 MG: 12.5 | 90 days supply | Qty: 90 | Fill #1

## 2015-11-24 MED FILL — HYDROXYCHLOROQUINE 200 MG T: 200 | 84 days supply | Qty: 120 | Fill #1

## 2015-12-14 MED FILL — SERTRALINE HCL 100 MG TAB: 100 | 30 days supply | Qty: 30 | Fill #2

## 2015-12-19 DIAGNOSIS — M25561 Pain in right knee: Secondary | ICD-10-CM | POA: Diagnosis not present

## 2015-12-20 MED FILL — MELOXICAM 15 MG TABLET: 15 | 30 days supply | Qty: 30 | Fill #0

## 2015-12-20 MED FILL — METHOCARBAMOL 500 MG TABLET: 500 | 15 days supply | Qty: 60 | Fill #0

## 2016-01-01 DIAGNOSIS — S83241A Other tear of medial meniscus, current injury, right knee, initial encounter: Secondary | ICD-10-CM | POA: Diagnosis not present

## 2016-01-03 ENCOUNTER — Other Ambulatory Visit (HOSPITAL_COMMUNITY): Payer: Self-pay | Admitting: Sports Medicine

## 2016-01-03 DIAGNOSIS — M25561 Pain in right knee: Secondary | ICD-10-CM

## 2016-01-05 ENCOUNTER — Ambulatory Visit (HOSPITAL_COMMUNITY)
Admission: RE | Admit: 2016-01-05 | Discharge: 2016-01-05 | Disposition: A | Discharge status: Home / Self Care | Payer: 59 | Source: Ambulatory Visit | Attending: Sports Medicine | Admitting: Sports Medicine

## 2016-01-05 DIAGNOSIS — M25561 Pain in right knee: Secondary | ICD-10-CM | POA: Insufficient documentation

## 2016-01-05 DIAGNOSIS — M25461 Effusion, right knee: Secondary | ICD-10-CM | POA: Diagnosis not present

## 2016-01-05 DIAGNOSIS — M660 Rupture of popliteal cyst: Secondary | ICD-10-CM | POA: Insufficient documentation

## 2016-01-05 DIAGNOSIS — R609 Edema, unspecified: Secondary | ICD-10-CM | POA: Diagnosis not present

## 2016-01-06 ENCOUNTER — Telehealth: Payer: Self-pay | Admitting: Rheumatology

## 2016-01-06 NOTE — Telephone Encounter (Signed)
Patient wants Dr. Estanislado Pandy to know she had an MRI done of her right leg last week at [facility]. Just an fyi.

## 2016-01-06 NOTE — Telephone Encounter (Signed)
Noted, thanks/ pt has appt here in a couple weeks, will review/ discuss with her then

## 2016-01-09 MED FILL — traMADol HCL 50 MG TABS: 50 | 10 days supply | Qty: 40 | Fill #0

## 2016-01-10 ENCOUNTER — Encounter: Payer: Self-pay | Admitting: Hematology and Oncology

## 2016-01-10 NOTE — Progress Notes (Signed)
Entered chart by mistake. Mistaked name at the bottom of print out for medication authorization for patient's name.

## 2016-01-16 DIAGNOSIS — M2241 Chondromalacia patellae, right knee: Secondary | ICD-10-CM | POA: Diagnosis not present

## 2016-01-23 DIAGNOSIS — M19072 Primary osteoarthritis, left ankle and foot: Secondary | ICD-10-CM

## 2016-01-23 DIAGNOSIS — F419 Anxiety disorder, unspecified: Secondary | ICD-10-CM | POA: Insufficient documentation

## 2016-01-23 DIAGNOSIS — C4491 Basal cell carcinoma of skin, unspecified: Secondary | ICD-10-CM | POA: Insufficient documentation

## 2016-01-23 DIAGNOSIS — Z79899 Other long term (current) drug therapy: Secondary | ICD-10-CM | POA: Insufficient documentation

## 2016-01-23 DIAGNOSIS — C439 Malignant melanoma of skin, unspecified: Secondary | ICD-10-CM | POA: Insufficient documentation

## 2016-01-23 DIAGNOSIS — M19071 Primary osteoarthritis, right ankle and foot: Secondary | ICD-10-CM | POA: Insufficient documentation

## 2016-01-23 DIAGNOSIS — M0579 Rheumatoid arthritis with rheumatoid factor of multiple sites without organ or systems involvement: Secondary | ICD-10-CM | POA: Insufficient documentation

## 2016-01-23 DIAGNOSIS — M19042 Primary osteoarthritis, left hand: Secondary | ICD-10-CM

## 2016-01-23 DIAGNOSIS — M19041 Primary osteoarthritis, right hand: Secondary | ICD-10-CM | POA: Insufficient documentation

## 2016-01-23 NOTE — Progress Notes (Signed)
Office Visit Note  Patient: Kara Campbell             Date of Birth: November 01, 1960           MRN: [id number]             PCP: Allena Katz, MD Referring: Everlene Farrier, MD Visit Date: 01/25/2016 Occupation: RN, oncology    Subjective:  Swelling of right hand   History of Present Illness: Kara Campbell is a 55 y.o. female  with history of  sero positive erosive rheumatoid arthritis. She states she's been having swelling of her right second MCP joint. She denies but joint discomfort. In October she was having recurrent swelling of her right knee joint and had it aspirated 2 by Dr. Alfonso Ramus was on October 30 and then second on a November 6. She states after cortisone injection she's feeling much better she also had MRI of her knee joint on 01/05/2016. The MRI showed him osteoarthritis, chondromalacia patella, ruptured Baker cyst and large joint effusion.  Activities of Daily Living:  Patient reports morning stiffness for 0 minutes.   Patient Denies nocturnal pain.  Difficulty dressing/grooming: Denies Difficulty climbing stairs: Denies Difficulty getting out of chair: Denies Difficulty using hands for taps, buttons, cutlery, and/or writing: Denies   Review of Systems  Constitutional: Negative for fatigue, night sweats, weight gain, weight loss and weakness.  HENT: Negative for mouth sores, trouble swallowing, trouble swallowing, mouth dryness and nose dryness.   Eyes: Negative for pain, redness, visual disturbance and dryness.  Respiratory: Negative for cough, shortness of breath and difficulty breathing.   Cardiovascular: Negative for chest pain, palpitations, hypertension, irregular heartbeat and swelling in legs/feet.  Gastrointestinal: Negative for blood in stool, constipation and diarrhea.  Endocrine: Negative for increased urination.  Genitourinary: Negative for vaginal dryness.  Musculoskeletal: Positive for arthralgias, joint pain and joint swelling. Negative for  myalgias, muscle weakness, morning stiffness, muscle tenderness and myalgias.  Skin: Negative for color change, rash, hair loss, skin tightness, ulcers and sensitivity to sunlight.  Allergic/Immunologic: Negative for susceptible to infections.  Neurological: Negative for dizziness, memory loss and night sweats.  Hematological: Negative for swollen glands.  Psychiatric/Behavioral: Negative for depressed mood and sleep disturbance. The patient is not nervous/anxious.     PMFS History:  Patient Active Problem List   Diagnosis Date Noted  . Rheumatoid arthritis involving multiple sites with positive rheumatoid factor ([place]) 01/23/2016  . High risk medication use 01/23/2016  . Primary osteoarthritis of both hands 01/23/2016  . Primary osteoarthritis of both feet 01/23/2016  . Malignant melanoma ([place]) 01/23/2016  . Basal cell carcinoma 01/23/2016  . Anxiety 01/23/2016  . Chest congestion 06/07/2015    Past Medical History:  Diagnosis Date  . Arthritis   . Melanoma ([place]) rhe  . Rheumatoid arthritis(714.0)     Family History  Problem Relation Age of Onset  . Cancer Mother     breast  . Rheum arthritis Other    Past Surgical History:  Procedure Laterality Date  . INJECTION KNEE Right    Fluid removed from knee x2   . MELANOMA EXCISION     Social History   Social History Narrative  . No narrative on file     Objective: Vital Signs: BP 118/80 (BP Location: Left Arm, Patient Position: Sitting, Cuff Size: Large)   Pulse 65   Resp 12   Ht 5\' 4"  (1.626 m)   Wt 155 lb (70.3 kg)   BMI 26.61 kg/m  Physical Exam  Constitutional: She is oriented to person, place, and time. She appears well-developed and well-nourished.  HENT:  Head: Normocephalic and atraumatic.  Eyes: Conjunctivae and EOM are normal.  Neck: Normal range of motion.  Cardiovascular: Normal rate, regular rhythm, normal heart sounds and intact distal pulses.   Pulmonary/Chest: Effort normal and breath sounds  normal.  Abdominal: Soft. Bowel sounds are normal.  Lymphadenopathy:    She has no cervical adenopathy.  Neurological: She is alert and oriented to person, place, and time.  Skin: Skin is warm and dry. Capillary refill takes less than 2 seconds.  Psychiatric: She has a normal mood and affect. Her behavior is normal.  Nursing note and vitals reviewed.    Musculoskeletal Exam: C-spine, thoracic, lumbar spine good range of motion. Shoulder joints, elbow joints, wrist joints with good range of motion. She has synovial thickening and synovitis over right second MCP joint rest of the MCPs PIPs DIPs did not show any synovitis she has good range of motion of bilateral hip joints right knee joint had some warmth but no effusion there was  palpable Baker's cyst. Left knee joint did not show any warmth or swelling. Ankle joints, MTPs PIPs with good range of motion she has hammertoes bilaterally.  CDAI Exam: CDAI Homunculus Exam:   Swelling:  Right hand: 2nd MCP  Joint Counts:  CDAI Tender Joint count: 0 CDAI Swollen Joint count: 1  Global Assessments:  Patient Global Assessment: 0     Investigation: Findings:  05/26/2015 CBC normal CMP normal    Imaging: Mr Knee Right Wo Contrast  Result Date: 01/06/2016 CLINICAL DATA:  Right knee pain and swelling.  Symptoms for 5 weeks. EXAM: MRI OF THE RIGHT KNEE WITHOUT CONTRAST TECHNIQUE: Multiplanar, multisequence MR imaging of the knee was performed. No intravenous contrast was administered. COMPARISON:  None. FINDINGS: MENISCI Medial meniscus:  Intact. Lateral meniscus:  Intact. LIGAMENTS Cruciates:  Intact ACL and PCL. Collaterals: Medial collateral ligament is intact. Lateral collateral ligament complex is intact. CARTILAGE Patellofemoral: High-grade partial-thickness cartilage loss with areas of full-thickness cartilage loss of the medial patellar facet and trochlea. Partial-thickness cartilage loss of the lateral patellofemoral compartment.  Medial:  No chondral defect. Lateral:  No chondral defect. Joint: Large joint effusion. Edema in superolateral Hoffa's fat. No plical thickening. Popliteal Fossa: Fluid along the superficial aspect of the gastrocnemius muscle likely reflecting a ruptured Baker cyst. Intact popliteus tendon. Extensor Mechanism:  Intact quadriceps tendon and patellar tendon. Bones:  No marrow signal abnormality.  No fracture or dislocation. Other: No fluid collection or hematoma. Nonspecific mild soft tissue edema superficial to the medial and lateral patellar retinaculum. IMPRESSION: 1. High-grade partial-thickness cartilage loss with areas of full-thickness cartilage loss of the medial patellar facet and trochlea. Partial-thickness cartilage loss of the lateral patellofemoral compartment. 2. Edema in superolateral Hoffa's fat as can be seen with patellar tendon -lateral femoral condyle friction syndrome. 3. Ruptured Baker cyst. 4. Large joint effusion. Electronically Signed   By: Kathreen Devoid   On: 01/06/2016 08:46    Speciality Comments: No specialty comments available.    Procedures:  No procedures performed Allergies: Amoxil [amoxicillin]   Assessment / Plan: Visit Diagnoses: Rheumatoid arthritis involving multiple sites with positive rheumatoid factor (HCC) - Erosive disease involving bilateral MTPs and right ulnar styloid: She has active synovitis involving her right second MCP joint and recurrent effusion in her right knee joint. We had detailed discussion regarding different different treatment options for rheumatoid arthritis she is aggressive  disease. Indications side effects contraindications of different medications were discussed handout was given consent was taken on methotrexate. I plan is to start on subcutaneous methotrexate at 10 mg subcutaneous every week for 2 weeks if labs are stable then we will increase the dose to 50 mg subcutaneous every week for 2 weeks, likewise if the labs are stable then we  will increase the dose to 20 mg subcutaneous every week and keep her on that dose. After that the labs will be monitored every 2-3 months. Abstinence from alcohol was discussed.  High risk medication use: She is on Plaquenil right now and is not responding to it. Her labs have been stable the last labs were in March. We'll check her labs today her eye exams have been normal per patient.  Primary osteoarthritis of both hands: She has mild osteoarthritis  Primary osteoarthritis of both feet: Has mild osteoarthritis  Malignant melanoma, : Followed up by dermatologist  Basal cell carcinoma, unspecified site  Anxiety    Orders: Orders Placed This Encounter  Procedures  . CBC with Differential/Platelet  . COMPLETE METABOLIC PANEL WITH GFR  . Protein electrophoresis, serum  . IgG, IgA, IgM  . Quantiferon tb gold assay (blood)  . COMPLETE METABOLIC PANEL WITH GFR  . CBC with Differential/Platelet   No orders of the defined types were placed in this encounter.   Face-to-face time spent with patient was 35 minutes. 50% of time was spent in counseling and coordination of care.  Follow-Up Instructions: Return in about 2 months (around 03/26/2016) for Rheumatoid arthritis.   Bo Merino, MD

## 2016-01-25 ENCOUNTER — Encounter: Payer: Self-pay | Admitting: Rheumatology

## 2016-01-25 ENCOUNTER — Ambulatory Visit (INDEPENDENT_AMBULATORY_CARE_PROVIDER_SITE_OTHER): Payer: 59 | Admitting: Rheumatology

## 2016-01-25 VITALS — BP 118/80 | HR 65 | Resp 12 | Ht 64.0 in | Wt 155.0 lb

## 2016-01-25 DIAGNOSIS — M19041 Primary osteoarthritis, right hand: Secondary | ICD-10-CM

## 2016-01-25 DIAGNOSIS — C439 Malignant melanoma of skin, unspecified: Secondary | ICD-10-CM

## 2016-01-25 DIAGNOSIS — C4491 Basal cell carcinoma of skin, unspecified: Secondary | ICD-10-CM | POA: Diagnosis not present

## 2016-01-25 DIAGNOSIS — M19042 Primary osteoarthritis, left hand: Secondary | ICD-10-CM | POA: Diagnosis not present

## 2016-01-25 DIAGNOSIS — Z79899 Other long term (current) drug therapy: Secondary | ICD-10-CM | POA: Diagnosis not present

## 2016-01-25 DIAGNOSIS — F419 Anxiety disorder, unspecified: Secondary | ICD-10-CM

## 2016-01-25 DIAGNOSIS — M0579 Rheumatoid arthritis with rheumatoid factor of multiple sites without organ or systems involvement: Secondary | ICD-10-CM

## 2016-01-25 DIAGNOSIS — M19071 Primary osteoarthritis, right ankle and foot: Secondary | ICD-10-CM | POA: Diagnosis not present

## 2016-01-25 DIAGNOSIS — M19072 Primary osteoarthritis, left ankle and foot: Secondary | ICD-10-CM

## 2016-01-25 LAB — CBC WITH DIFFERENTIAL/PLATELET
BASOS PCT: 0 %
Basophils Absolute: 0 cells/uL (ref 0–200)
EOS ABS: 176 {cells}/uL (ref 15–500)
Eosinophils Relative: 2 %
HCT: 40.9 % (ref 35.0–45.0)
Hemoglobin: 13.7 g/dL (ref 11.7–15.5)
LYMPHS PCT: 21 %
Lymphs Abs: 1848 cells/uL (ref 850–3900)
MCH: 30.2 pg (ref 27.0–33.0)
MCHC: 33.5 g/dL (ref 32.0–36.0)
MCV: 90.1 fL (ref 80.0–100.0)
MONOS PCT: 9 %
MPV: 9.3 fL (ref 7.5–12.5)
Monocytes Absolute: 792 cells/uL (ref 200–950)
Neutro Abs: 5984 cells/uL (ref 1500–7800)
Neutrophils Relative %: 68 %
PLATELETS: 362 10*3/uL (ref 140–400)
RBC: 4.54 MIL/uL (ref 3.80–5.10)
RDW: 13.4 % (ref 11.0–15.0)
WBC: 8.8 10*3/uL (ref 3.8–10.8)

## 2016-01-25 NOTE — Patient Instructions (Addendum)
Standing Labs We placed an order today for your standing lab work.    Please come back and get your standing labs in two weeks, four weeks, six weeks, then every 2 months after that.  We have open lab Monday through Friday from 8:30-11:30 AM and 1-4 PM at the office of Dr. Tresa Moore, PA.   The office is located at [address], [place], [place], [place] [address] No appointment is necessary.   Labs are drawn by [facility].  You may receive a bill from [facility] for your lab work.    Please talk to your primary care provider about getting a pneumonia vaccine and a shingles vaccine.

## 2016-01-25 NOTE — Progress Notes (Signed)
Pharmacy Note Subjective: Patient presents today to the [facility] to see Dr. Estanislado Pandy.  Patient seen by the pharmacist for counseling on injectable methotrexate.    Objective: CBC, CMP were normal on 05/26/15.  Repeat CBC and CMP ordered today  TB Gold: ordered today Hepatitis panel: negative (06/08/2009)  Chest-xray: On file from 06/08/2012  Assessment/Plan:  Patient to be initiated on methotrexate 0.4 mL weekly for 2 weeks, 0.6 mL weekly for 2 weeks, then 0.8 mL weekly.  Patient was counseled on the purpose, proper use, and adverse effects of methotrexate including nausea, infection, and signs and symptoms of pneumonitis.  Reviewed instructions with patient to take methotrexate weekly along with folic acid 2 mg daily.  Discussed the importance of frequent monitoring of kidney and liver function and blood counts, and provided patient with standing lab instructions.  Counseled patient to avoid sulfa antibiotics such as Bactrim or Septra while on methotrexate.  Provided patient with educational materials on methotrexate and answered all questions.  Patient confirms she had her annual influenza vaccine.  Advised patient to get a pneumococcal vaccine and shingles vaccine if patient has not already had one.  Patient voiced understanding.  Educated patient on how to use a vial and syringe.  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  Provided patient educational material on injection technique and storage of methotrexate.  Patient consented to methotrexate use.  Will upload consent into the media tab.  Will plan on initiation of methotrexate once patient's lab results return.    [name], Pharm.D., BCPS Clinical Pharmacist Pager: [phone] Phone: [phone] 01/25/2016 3:26 PM

## 2016-01-25 NOTE — Progress Notes (Signed)
Rheumatology Medication Review by a Pharmacist Does the patient feel that his/her medications are working for him/her?  Yes Has the patient been experiencing any side effects to the medications prescribed?  No Does the patient have any problems obtaining medications?  No  Issues to address at subsequent visits: None   Pharmacist comments:  Kara Campbell is a pleasant 55yo F who presents for follow up on her rheumatoid factor positive rheumatoid arthritis.  Patient is currently taking hydroxychloroquine 200 mg daily.  Patient is past due for her standing lab orders (CBC, CMP).  Will place orders for CBC and CMP today.  Patient had most recent hydroxychloroquine eye exam on 05/26/15 which was normal.  Patient will be due for eye exam again in March 2018.  Provided patient with eye exam form.  Patient did not have any medication related questions or concerns at this time.     [name], Pharm.D., BCPS Clinical Pharmacist Pager: [phone] Phone: [phone] 01/25/2016 2:19 PM

## 2016-01-26 LAB — COMPLETE METABOLIC PANEL WITH GFR
ALT: 15 U/L (ref 6–29)
AST: 21 U/L (ref 10–35)
Albumin: 4.6 g/dL (ref 3.6–5.1)
Alkaline Phosphatase: 59 U/L (ref 33–130)
BUN: 21 mg/dL (ref 7–25)
CHLORIDE: 101 mmol/L (ref 98–110)
CO2: 23 mmol/L (ref 20–31)
Calcium: 9.6 mg/dL (ref 8.6–10.4)
Creat: 1.26 mg/dL — ABNORMAL HIGH (ref 0.50–1.05)
GFR, EST AFRICAN AMERICAN: 55 mL/min — AB (ref >=60)
GFR, EST NON AFRICAN AMERICAN: 48 mL/min — AB (ref >=60)
Glucose, Bld: 107 mg/dL — ABNORMAL HIGH (ref 65–99)
Potassium: 3.9 mmol/L (ref 3.5–5.3)
Sodium: 138 mmol/L (ref 135–146)
Total Bilirubin: 0.5 mg/dL (ref 0.2–1.2)
Total Protein: 7.4 g/dL (ref 6.1–8.1)

## 2016-01-26 LAB — IGG, IGA, IGM
IGA: 216 mg/dL (ref 81–463)
IGM, SERUM: 160 mg/dL (ref 48–271)
IgG (Immunoglobin G), Serum: 1085 mg/dL (ref 694–1618)

## 2016-01-27 LAB — PROTEIN ELECTROPHORESIS, SERUM
ALPHA-1-GLOBULIN: 0.4 g/dL — AB (ref 0.2–0.3)
ALPHA-2-GLOBULIN: 0.7 g/dL (ref 0.5–0.9)
Albumin ELP: 4.5 g/dL (ref 3.8–4.8)
Beta 2: 0.3 g/dL (ref 0.2–0.5)
Beta Globulin: 0.5 g/dL (ref 0.4–0.6)
Gamma Globulin: 1 g/dL (ref 0.8–1.7)
Total Protein, Serum Electrophoresis: 7.4 g/dL (ref 6.1–8.1)

## 2016-01-27 LAB — QUANTIFERON TB GOLD ASSAY (BLOOD)
Interferon Gamma Release Assay: NEGATIVE
MITOGEN-NIL SO: 4.71 [IU]/mL
QUANTIFERON NIL VALUE: 0.03 [IU]/mL
Quantiferon Tb Ag Minus Nil Value: <0 IU/mL

## 2016-02-01 MED FILL — MELOXICAM 15 MG TABLET: 15 | 30 days supply | Qty: 30 | Fill #1

## 2016-02-08 MED FILL — SERTRALINE HCL 100 MG TAB: 100 | 30 days supply | Qty: 30 | Fill #3

## 2016-02-21 NOTE — Progress Notes (Deleted)
Office Visit Note  Patient: Kara Campbell             Date of Birth: 11/29/1960           MRN: [id number]             PCP: Allena Katz, MD Referring: Everlene Farrier, MD Visit Date: 02/22/2016 Occupation: RN on oncology floor    Subjective:  No chief complaint on file.   History of Present Illness: Kara Campbell is a 55 y.o. female ***   Activities of Daily Living:  Patient reports morning stiffness for *** {minute/hour:19697}.   Patient {ACTIONS;DENIES/REPORTS:21021675::"Denies"} nocturnal pain.  Difficulty dressing/grooming: {ACTIONS;DENIES/REPORTS:21021675::"Denies"} Difficulty climbing stairs: {ACTIONS;DENIES/REPORTS:21021675::"Denies"} Difficulty getting out of chair: {ACTIONS;DENIES/REPORTS:21021675::"Denies"} Difficulty using hands for taps, buttons, cutlery, and/or writing: {ACTIONS;DENIES/REPORTS:21021675::"Denies"}   No Rheumatology ROS completed.   PMFS History:  Patient Active Problem List   Diagnosis Date Noted  . Rheumatoid arthritis involving multiple sites with positive rheumatoid factor ([place]) 01/23/2016  . High risk medication use 01/23/2016  . Primary osteoarthritis of both hands 01/23/2016  . Primary osteoarthritis of both feet 01/23/2016  . Malignant melanoma ([place]) 01/23/2016  . Basal cell carcinoma 01/23/2016  . Anxiety 01/23/2016  . Chest congestion 06/07/2015    Past Medical History:  Diagnosis Date  . Arthritis   . Melanoma ([place]) rhe  . Rheumatoid arthritis(714.0)     Family History  Problem Relation Age of Onset  . Cancer Mother     breast  . Rheum arthritis Other    Past Surgical History:  Procedure Laterality Date  . INJECTION KNEE Right    Fluid removed from knee x2   . MELANOMA EXCISION     Social History   Social History Narrative  . No narrative on file     Objective: Vital Signs: There were no vitals taken for this visit.   Physical Exam   Musculoskeletal Exam: ***  CDAI Exam: No CDAI exam completed.      Investigation: Findings:  03/24/2014 x-ray of C-spine, 2 views  which showed disk disease, multilevel spondylosis with C5-6 narrowing and C6-7 narrowing and facet joint arthropathy consistent with disk disease of C-spine.  X-ray of left knee joint, 2 views, showed mild medial compartment narrowing and mild patellofemoral narrowing consistent with mild osteoarthritis.  Hand x-rays, 2 views, which I compared from her June 2012 x-rays.  There was no interval change, although she had a small right ulnar styloid erosion and some PIP narrowing consistent with rheumatoid arthritis.  Bilateral feet x-rays, 2 views, showed bilateral calcaneal spur, bilateral MTP erosions, and these were unchanged from her x-rays in June 2012.  , 05/26/2015 CBC normal, CMP normal     Office Visit on 01/25/2016  Component Date Value Ref Range Status  . WBC 01/25/2016 8.8  3.8 - 10.8 K/uL Final  . RBC 01/25/2016 4.54  3.80 - 5.10 MIL/uL Final  . Hemoglobin 01/25/2016 13.7  11.7 - 15.5 g/dL Final  . HCT 01/25/2016 40.9  35.0 - 45.0 % Final  . MCV 01/25/2016 90.1  80.0 - 100.0 fL Final  . MCH 01/25/2016 30.2  27.0 - 33.0 pg Final  . MCHC 01/25/2016 33.5  32.0 - 36.0 g/dL Final  . RDW 01/25/2016 13.4  11.0 - 15.0 % Final  . Platelets 01/25/2016 362  140 - 400 K/uL Final  . MPV 01/25/2016 9.3  7.5 - 12.5 fL Final  . Neutro Abs 01/25/2016 5984  1,500 - 7,800 cells/uL Final  . Lymphs  Abs 01/25/2016 1848  850 - 3,900 cells/uL Final  . Monocytes Absolute 01/25/2016 792  200 - 950 cells/uL Final  . Eosinophils Absolute 01/25/2016 176  15 - 500 cells/uL Final  . Basophils Absolute 01/25/2016 0  0 - 200 cells/uL Final  . Neutrophils Relative % 01/25/2016 68  % Final  . Lymphocytes Relative 01/25/2016 21  % Final  . Monocytes Relative 01/25/2016 9  % Final  . Eosinophils Relative 01/25/2016 2  % Final  . Basophils Relative 01/25/2016 0  % Final  . Smear Review 01/25/2016 Criteria for review not met   Final  . Sodium  01/26/2016 138  135 - 146 mmol/L Final  . Potassium 01/26/2016 3.9  3.5 - 5.3 mmol/L Final  . Chloride 01/26/2016 101  98 - 110 mmol/L Final  . CO2 01/26/2016 23  20 - 31 mmol/L Final  . Glucose, Bld 01/26/2016 107* 65 - 99 mg/dL Final  . BUN 01/26/2016 21  7 - 25 mg/dL Final  . Creat 01/26/2016 1.26* 0.50 - 1.05 mg/dL Final   Comment:   For patients > or = 55 years of age: The upper reference limit for Creatinine is approximately 13% higher for people identified as African-American.     . Total Bilirubin 01/26/2016 0.5  0.2 - 1.2 mg/dL Final  . Alkaline Phosphatase 01/26/2016 59  33 - 130 U/L Final  . AST 01/26/2016 21  10 - 35 U/L Final  . ALT 01/26/2016 15  6 - 29 U/L Final  . Total Protein 01/26/2016 7.4  6.1 - 8.1 g/dL Final  . Albumin 01/26/2016 4.6  3.6 - 5.1 g/dL Final  . Calcium 01/26/2016 9.6  8.6 - 10.4 mg/dL Final  . GFR, Est African American 01/26/2016 55* >=60 mL/min Final  . GFR, Est Non African American 01/26/2016 48* >=60 mL/min Final  . Total Protein, Serum Electrophores* 01/27/2016 7.4  6.1 - 8.1 g/dL Final  . Albumin ELP 01/27/2016 4.5  3.8 - 4.8 g/dL Final  . Alpha-1-Globulin 01/27/2016 0.4* 0.2 - 0.3 g/dL Final  . Alpha-2-Globulin 01/27/2016 0.7  0.5 - 0.9 g/dL Final  . Beta Globulin 01/27/2016 0.5  0.4 - 0.6 g/dL Final  . Beta 2 01/27/2016 0.3  0.2 - 0.5 g/dL Final  . Gamma Globulin 01/27/2016 1.0  0.8 - 1.7 g/dL Final  . Abnormal Protein Band1 01/27/2016 NOT DET  g/dL Final  . SPE Interp. 01/27/2016 SEE NOTE   Final   Comment: One or more serum protein fractions are outside the normal ranges. No abnormal protein bands are apparent. Reviewed by Francis Gaines Mammarappallil MD (Electronic Signature on File)   . Abnormal Protein Band2 01/27/2016 NOT DET  g/dL Final  . Abnormal Protein Band3 01/27/2016 NOT DET  g/dL Final  . IgG (Immunoglobin G), Serum 01/26/2016 1085  694 - 1,618 mg/dL Final  . IgA 01/26/2016 216  81 - 463 mg/dL Final  . IgM, Serum 01/26/2016  160  48 - 271 mg/dL Final  . Interferon Gamma Release Assay 01/27/2016 NEGATIVE  NEGATIVE Final  . Quantiferon Nil Value 01/27/2016 0.03  IU/mL Final  . Mitogen-Nil 01/27/2016 4.71  IU/mL Final  . Quantiferon Tb Ag Minus Nil Value 01/27/2016 <0.00  IU/mL Final   Comment:   The Nil tube value is used to determine if the patient has a preexisting immune response which could cause a false-positive reading on the test. In order for a test to be valid, the Nil tube must have a value of less  than or equal to 8.0 IU/mL.   The mitogen control tube is used to assure the patient has a healthy immune status and also serves as a control for correct blood handling and incubation. It is used to detect false-negative readings. The mitogen tube must have a gamma interferon value of greater than or equal to 0.5 IU/mL higher than the value of the Nil tube.   The TB antigen tube is coated with the M. tuberculosis specific antigens. For a test to be considered positive, the TB antigen tube value minus the Nil tube value must be greater than or equal to 0.35 IU/mL.   For additional information, please refer to http://education.[facility].com/faq/QFT (This link is being provided for informational/educational purposes only.)      Imaging: No results found.  Speciality Comments: No specialty comments available.    Procedures:  No procedures performed Allergies: Amoxil [amoxicillin]   Assessment / Plan:     Visit Diagnoses: Rheumatoid arthritis  - Positive rheumatoid factor, erosive disease  High risk medication use - Plaquenil  Primary osteoarthritis of both hands  Primary osteoarthritis of both feet - With calcaneal spurs  Malignant melanoma  Basal cell carcinoma  Anxiety    Orders: No orders of the defined types were placed in this encounter.  No orders of the defined types were placed in this encounter.   Face-to-face time spent with patient was *** minutes. 50% of time  was spent in counseling and coordination of care.  Follow-Up Instructions: No Follow-up on file.   Bo Merino, MD

## 2016-02-22 ENCOUNTER — Ambulatory Visit: Payer: 59 | Admitting: Rheumatology

## 2016-02-23 ENCOUNTER — Ambulatory Visit (INDEPENDENT_AMBULATORY_CARE_PROVIDER_SITE_OTHER): Payer: 59 | Admitting: Rheumatology

## 2016-02-23 ENCOUNTER — Ambulatory Visit (INDEPENDENT_AMBULATORY_CARE_PROVIDER_SITE_OTHER): Payer: Self-pay

## 2016-02-23 ENCOUNTER — Encounter: Payer: Self-pay | Admitting: Rheumatology

## 2016-02-23 VITALS — BP 130/78 | HR 64 | Resp 16 | Ht 66.0 in | Wt 154.0 lb

## 2016-02-23 DIAGNOSIS — Z79899 Other long term (current) drug therapy: Secondary | ICD-10-CM

## 2016-02-23 DIAGNOSIS — M0579 Rheumatoid arthritis with rheumatoid factor of multiple sites without organ or systems involvement: Secondary | ICD-10-CM

## 2016-02-23 DIAGNOSIS — M79642 Pain in left hand: Secondary | ICD-10-CM

## 2016-02-23 DIAGNOSIS — M542 Cervicalgia: Secondary | ICD-10-CM | POA: Diagnosis not present

## 2016-02-23 DIAGNOSIS — M79641 Pain in right hand: Secondary | ICD-10-CM | POA: Diagnosis not present

## 2016-02-23 NOTE — Patient Instructions (Signed)
Leflunomide tablets What is this medicine? LEFLUNOMIDE (le FLOO na mide) is for rheumatoid arthritis. This medicine may be used for other purposes; ask your health care provider or pharmacist if you have questions. COMMON BRAND NAME(S): Arava What should I tell my health care provider before I take this medicine? They need to know if you have any of these conditions: -alcoholism -bone marrow problems -fever or infection -immune system problems -kidney disease -liver disease -an unusual or allergic reaction to leflunomide, teriflunomide, other medicines, lactose, foods, dyes, or preservatives -pregnant or trying to get pregnant -breast-feeding How should I use this medicine? Take this medicine by mouth with a full glass of water. Follow the directions on the prescription label. Take your medicine at regular intervals. Do not take your medicine more often than directed. Do not stop taking except on your doctor's advice. Talk to your pediatrician regarding the use of this medicine in children. Special care may be needed. Overdosage: If you think you have taken too much of this medicine contact a poison control center or emergency room at once. NOTE: This medicine is only for you. Do not share this medicine with others. What if I miss a dose? If you miss a dose, take it as soon as you can. If it is almost time for your next dose, take only that dose. Do not take double or extra doses. What may interact with this medicine? Do not take this medicine with any of the following medications: -teriflunomide This medicine may also interact with the following medications: -charcoal -cholestyramine -methotrexate -NSAIDs, medicines for pain and inflammation, like ibuprofen or naproxen -phenytoin -rifampin -tolbutamide -vaccines -warfarin This list may not describe all possible interactions. Give your health care provider a list of all the medicines, herbs, non-prescription drugs, or dietary  supplements you use. Also tell them if you smoke, drink alcohol, or use illegal drugs. Some items may interact with your medicine. What should I watch for while using this medicine? Visit your doctor or health care professional for regular checks on your progress. You will need frequent blood checks while you are receiving the medicine. If you get a cold or other infection while receiving this medicine, call your doctor or health care professional. Do not treat yourself. The medicine may increase your risk of getting an infection. If you are a woman who has the potential to become pregnant, discuss birth control options with your doctor or health care professional. You must not be pregnant, and you must be using a reliable form of birth control. The medicine may harm an unborn baby. Immediately call your doctor if you think you might be pregnant. Alcoholic drinks may increase possible damage to your liver. Do not drink alcohol while taking this medicine. What side effects may I notice from receiving this medicine? Side effects that you should report to your doctor or health care professional as soon as possible: -allergic reactions like skin rash, itching or hives, swelling of the face, lips, or tongue -cough -difficulty breathing or shortness of breath -fever, chills or any other sign of infection -redness, blistering, peeling or loosening of the skin, including inside the mouth -unusual bleeding or bruising -unusually weak or tired -vomiting -yellowing of eyes or skin Side effects that usually do not require medical attention (report to your doctor or health care professional if they continue or are bothersome): -diarrhea -hair loss -headache -nausea This list may not describe all possible side effects. Call your doctor for medical advice about side effects. You   may report side effects to FDA at 1-800-FDA-1088. Where should I keep my medicine? Keep out of the reach of children. Store at  room temperature between 15 and 30 degrees C (59 and 86 degrees F). Protect from moisture and light. Throw away any unused medicine after the expiration date. NOTE: This sheet is a summary. It may not cover all possible information. If you have questions about this medicine, talk to your doctor, pharmacist, or health care provider.  2017 Elsevier/Gold Standard (2013-02-24 10:53:11)  

## 2016-02-23 NOTE — Progress Notes (Signed)
Pharmacy Note  Subjective: Patient presents today to the [facility] to see Dr. Estanislado Pandy.  Patient seen by the pharmacist for counseling on leflunomide Jolee Ewing).    Objective: CBC    Component Value Date/Time   WBC 8.8 01/25/2016 1414   RBC 4.54 01/25/2016 1414   HGB 13.7 01/25/2016 1414   HCT 40.9 01/25/2016 1414   PLT 362 01/25/2016 1414   MCV 90.1 01/25/2016 1414   MCH 30.2 01/25/2016 1414   MCHC 33.5 01/25/2016 1414   RDW 13.4 01/25/2016 1414   LYMPHSABS 1,848 01/25/2016 1414   MONOABS 792 01/25/2016 1414   EOSABS 176 01/25/2016 1414   BASOSABS 0 01/25/2016 1414    CMP     Component Value Date/Time   NA 138 01/25/2016 1414   K 3.9 01/25/2016 1414   CL 101 01/25/2016 1414   CO2 23 01/25/2016 1414   GLUCOSE 107 (H) 01/25/2016 1414   BUN 21 01/25/2016 1414   CREATININE 1.26 (H) 01/25/2016 1414   CALCIUM 9.6 01/25/2016 1414   PROT 7.4 01/25/2016 1414   ALBUMIN 4.6 01/25/2016 1414   AST 21 01/25/2016 1414   ALT 15 01/25/2016 1414   ALKPHOS 59 01/25/2016 1414   BILITOT 0.5 01/25/2016 1414   GFRNONAA 48 (L) 01/25/2016 1414   GFRAA 55 (L) 01/25/2016 1414    TB Gold: negative (01/25/16)  Pregnancy status:  Post-menopausal  Vitals:   02/23/16 0842  BP: 130/78  Pulse: 64  Resp: 16    Assessment/Plan: Patient was counseled on the purpose, proper use, and adverse effects of leflunomide including risk of infection, nausea/diarrhea/weight loss, increase in blood pressure, rash, hair loss, tingling in the hands and feet, and signs and symptoms of interstitial lung disease.  Discussed the importance of frequent monitoring of liver function and blood counts, and patient was provided with instructions for standing labs.  Discussed importance of birth control while on leflunomide due to risk of congenital abnormalities, and patient confirms she is post-menopausal.  Provided patient with educational materials on leflunomide and answered all questions.  Patient  consented to [place] use, and consent will be uploaded into the media tab.  Patient is aware that leflunomide will not be initiated until patient has repeat labs next week.     [name], Pharm.D., BCPS Clinical Pharmacist Pager: [phone] Phone: [phone] 02/23/2016 9:50 AM

## 2016-02-23 NOTE — Progress Notes (Signed)
Office Visit Note  Patient: Kara Campbell             Date of Birth: Mar 03, 1961           MRN: [id number]             PCP: Allena Katz, MD Referring: Everlene Farrier, MD Visit Date: 02/23/2016 Occupation: @GUAROCC @    Subjective:  Pain of the Right Hand; Medication Refill (states MTX not sent in yet); and Neck Pain   History of Present Illness: Kara Campbell is a 55 y.o. female  Last seen 06/02/2015. Patient is complaining of right second and third MCP swelling and pain. Taking Plaquenil on a regular basis but still having stiffness and swelling and pain to the right second MCP joint and some to the third.  In addition she is also having neck pain at this time. We will do an x-ray today to see what's going on. She has tried tramadol for pain with no relief Zanaflex for pain in case it was a muscle tension headache with no relief and even more meloxicam for pain with no relief.  She has a history of right knee joint with effusion which was drained twice and injected with cortisone twice by her orthopedic doctor.  Based on her above symptoms, she may need additional treatment since Plaquenil may be not as effective as desired. Unfortunately her most recent labs show abnormal kidney function. We advised the patient to discontinue meloxicam and we wanted to recheck her kidney function on today's visit. She did not get the message and as a result she has not discontinued the meloxicam but she does know what now and she will discontinue the medication. She'll come back next week to get CMP with GFR.  At this time, with her neck pain possibly coming from RA versus other sources, right second and third MCP swelling and pain, history of right knee joint effusion 2 with cortisone injection 2, she may benefit from additional DMARD therapy. We will decide this once we get her kidney function results back next week  Activities of Daily Living:  Patient reports morning stiffness for  30 minutes.   Patient Reports nocturnal pain.  Difficulty dressing/grooming: Reports Difficulty climbing stairs: Reports Difficulty getting out of chair: Reports Difficulty using hands for taps, buttons, cutlery, and/or writing: Reports   Review of Systems  Constitutional: Negative for fatigue.  HENT: Negative for mouth sores and mouth dryness.   Eyes: Negative for dryness.  Respiratory: Negative for shortness of breath.   Gastrointestinal: Negative for constipation and diarrhea.  Musculoskeletal: Positive for arthralgias (pain to neck w/ rom; hx of am stiffness), joint pain (pain to neck w/ rom; hx of am stiffness) and morning stiffness. Negative for myalgias and myalgias.  Skin: Negative for sensitivity to sunlight.  Psychiatric/Behavioral: Negative for decreased concentration and sleep disturbance.    PMFS History:  Patient Active Problem List   Diagnosis Date Noted  . Rheumatoid arthritis involving multiple sites with positive rheumatoid factor ([place]) 01/23/2016  . High risk medication use 01/23/2016  . Primary osteoarthritis of both hands 01/23/2016  . Primary osteoarthritis of both feet 01/23/2016  . Malignant melanoma ([place]) 01/23/2016  . Basal cell carcinoma 01/23/2016  . Anxiety 01/23/2016  . Chest congestion 06/07/2015    Past Medical History:  Diagnosis Date  . Arthritis   . Melanoma ([place]) rhe  . Rheumatoid arthritis(714.0)     Family History  Problem Relation Age of Onset  . Cancer  Mother     breast  . Rheum arthritis Other    Past Surgical History:  Procedure Laterality Date  . INJECTION KNEE Right    Fluid removed from knee x2   . MELANOMA EXCISION     Social History   Social History Narrative  . No narrative on file     Objective: Vital Signs: BP 130/78   Pulse 64   Resp 16   Ht 5\' 6"  (1.676 m)   Wt 154 lb (69.9 kg)   BMI 24.86 kg/m    Physical Exam  Constitutional: She is oriented to person, place, and time. She appears well-developed  and well-nourished.  HENT:  Head: Normocephalic and atraumatic.  Eyes: EOM are normal. Pupils are equal, round, and reactive to light.  Neck: Decreased range of motion (due to pain w/ rom;) present.  Cardiovascular: Normal rate, regular rhythm and normal heart sounds.  Exam reveals no gallop and no friction rub.   No murmur heard. Pulmonary/Chest: Effort normal and breath sounds normal. She has no wheezes. She has no rales.  Abdominal: Soft. Bowel sounds are normal. She exhibits no distension. There is no tenderness. There is no guarding. No hernia.  Musculoskeletal: She exhibits no edema, tenderness or deformity.  Lymphadenopathy:    She has no cervical adenopathy.  Neurological: She is alert and oriented to person, place, and time. Coordination normal.  Skin: Skin is warm and dry. Capillary refill takes less than 2 seconds. No rash noted.  Psychiatric: She has a normal mood and affect. Her behavior is normal.  Nursing note and vitals reviewed.    Musculoskeletal Exam:  Full range of motion of all joints except decreased range of motion of C-spine secondary to pain with range of motion Right knee was not having good flexion when she had the fusion but today flexion and extension of the right knee are normal or myalgia tender points are all absent Grip strength is equal and strong bilaterally  CDAI Exam: CDAI Homunculus Exam:   Tenderness:  Right hand: 2nd MCP Left hand: 3rd MCP  Swelling:  Right hand: 2nd MCP Left hand: 3rd MCP  Joint Counts:  CDAI Tender Joint count: 2 CDAI Swollen Joint count: 2     Investigation: No additional findings.   Imaging: No results found.  Speciality Comments: No specialty comments available.    Procedures:  No procedures performed Allergies: Amoxil [amoxicillin]   Assessment / Plan:     Visit Diagnoses: Rheumatoid arthritis with rheumatoid factor of multiple sites without organ or systems involvement (HCC)  High risk  medication use - Plan: CBC with Differential/Platelet, COMPLETE METABOLIC PANEL WITH GFR  Neck pain - Plan: XR Cervical Spine 2 or 3 views, XR Hand 2 View Left, MR CERVICAL SPINE WO CONTRAST  Pain in left hand - Plan: XR Hand 2 View Left  Pain in right hand - Plan: XR Hand 2 View Right   Plan: I've ordered an MRI without contrast of the C-spine due to the neck pain that she is having. X-rays done today of the C-spine shows listhesis of C4-C5; joint space narrowing at C5-C6 and C6-C7  Plan: Stop the meloxicam  Plan: CMP with GFR next week Plan: Handout and consent on [facility].; If patient's kidney functions are back to normal, we can add Arava to her Plaquenil. Once patient is stable, we can consider stopping the Plaquenil if Gwenevere Ghazi can do the work itself. We will start with Arava 10 mg every day for the first  week and then increase it to 20 mg thereafter(we'll write prescription when the labs confirm that patient kidney function is back to normal neck suite.).; CBC with differential CMP with GFR in 1 month then in 2 months  Plan: Patient needs M.D. list since she does not have a PCP at this time. When she returns to clinic next week for the CMP with GFR, please give her M.D. list.  Orders: Orders Placed This Encounter  Procedures  . XR Cervical Spine 2 or 3 views  . XR Hand 2 View Left  . XR Hand 2 View Right  . MR CERVICAL SPINE WO CONTRAST  . CBC with Differential/Platelet  . COMPLETE METABOLIC PANEL WITH GFR   No orders of the defined types were placed in this encounter.   Face-to-face time spent with patient was 30 minutes. 50% of time was spent in counseling and coordination of care.  Follow-Up Instructions: Return in about 3 months (around 05/23/2016) for RA,plq 300mg  QD, neck pain, oa hand & Feet w/ pain;.   Diahn Waidelich, PA-C   I examined and evaluated the patient with Eliezer Lofts PA. The plan of care was discussed as noted above.  Bo Merino, MD

## 2016-02-24 ENCOUNTER — Telehealth: Payer: Self-pay | Admitting: Rheumatology

## 2016-02-24 MED ORDER — PREDNISONE 5 MG PO TABS: ORAL_TABLET | ORAL | 0 refills | Status: DC

## 2016-02-24 MED FILL — predniSONE 5 MG TABS: 5 | 15 days supply | Qty: 32 | Fill #0

## 2016-02-24 NOTE — Telephone Encounter (Signed)
Patient advised prescription has been sent to the pharmacy.  

## 2016-02-24 NOTE — Telephone Encounter (Signed)
Okay to call in the following prednisone taper:------ OK to Prescribe Prednisone 5mg :  4po qAM x 3 days,  3po qAM x 3 days, 2po qAM x 3 days, 1po qAM x 3 days, 1/2po qAM x 3 days, then stop. disp 32 pills w/ no refills.

## 2016-02-24 NOTE — Addendum Note (Signed)
Addended by: Carole Binning on: 02/24/2016 03:56 PM   Modules accepted: Orders

## 2016-02-24 NOTE — Telephone Encounter (Signed)
Patient is requesting a steroid dose pack for the swelling and pain she is experiencing. Patient was seen yesterday and was told to take extra strength tylenol. 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

## 2016-02-24 NOTE — Telephone Encounter (Signed)
Patient states she is having pain and in her neck. She is requesting a Medrol dose pack to help with her pain and swelling. Patient is taking extra strength Tylenol with no relief.

## 2016-03-07 ENCOUNTER — Ambulatory Visit (HOSPITAL_COMMUNITY): Admission: RE | Admit: 2016-03-07 | Payer: 59 | Source: Ambulatory Visit

## 2016-03-14 ENCOUNTER — Other Ambulatory Visit: Payer: Self-pay | Admitting: *Deleted

## 2016-03-14 DIAGNOSIS — Z79899 Other long term (current) drug therapy: Secondary | ICD-10-CM | POA: Diagnosis not present

## 2016-03-14 LAB — CBC WITH DIFFERENTIAL/PLATELET
BASOS PCT: 1 %
Basophils Absolute: 61 cells/uL (ref 0–200)
EOS ABS: 183 {cells}/uL (ref 15–500)
Eosinophils Relative: 3 %
HCT: 39.7 % (ref 35.0–45.0)
Hemoglobin: 13 g/dL (ref 11.7–15.5)
Lymphocytes Relative: 20 %
Lymphs Abs: 1220 cells/uL (ref 850–3900)
MCH: 29.8 pg (ref 27.0–33.0)
MCHC: 32.7 g/dL (ref 32.0–36.0)
MCV: 91.1 fL (ref 80.0–100.0)
MONOS PCT: 10 %
MPV: 9.5 fL (ref 7.5–12.5)
Monocytes Absolute: 610 cells/uL (ref 200–950)
NEUTROS ABS: 4026 {cells}/uL (ref 1500–7800)
Neutrophils Relative %: 66 %
PLATELETS: 279 10*3/uL (ref 140–400)
RBC: 4.36 MIL/uL (ref 3.80–5.10)
RDW: 14.3 % (ref 11.0–15.0)
WBC: 6.1 10*3/uL (ref 3.8–10.8)

## 2016-03-15 LAB — COMPLETE METABOLIC PANEL WITH GFR
ALT: 15 U/L (ref 6–29)
AST: 21 U/L (ref 10–35)
Albumin: 4.1 g/dL (ref 3.6–5.1)
Alkaline Phosphatase: 50 U/L (ref 33–130)
BILIRUBIN TOTAL: 0.9 mg/dL (ref 0.2–1.2)
BUN: 13 mg/dL (ref 7–25)
CHLORIDE: 106 mmol/L (ref 98–110)
CO2: 25 mmol/L (ref 20–31)
CREATININE: 0.76 mg/dL (ref 0.50–1.05)
Calcium: 9.2 mg/dL (ref 8.6–10.4)
GFR, Est Non African American: 89 mL/min (ref >=60)
GLUCOSE: 83 mg/dL (ref 65–99)
Potassium: 4.1 mmol/L (ref 3.5–5.3)
Sodium: 143 mmol/L (ref 135–146)
TOTAL PROTEIN: 6.3 g/dL (ref 6.1–8.1)

## 2016-03-15 NOTE — Progress Notes (Signed)
CMP with GFR and CBC with differential normal.No change in treatmentSend a copy of this note to patient's PCP.Please inform patient of these labs. Thank you

## 2016-03-19 ENCOUNTER — Telehealth: Payer: Self-pay | Admitting: *Deleted

## 2016-03-19 DIAGNOSIS — Z79899 Other long term (current) drug therapy: Secondary | ICD-10-CM

## 2016-03-19 MED ORDER — PREDNISONE 5 MG PO TABS: ORAL_TABLET | ORAL | 0 refills | Status: DC

## 2016-03-19 MED ORDER — LEFLUNOMIDE 10 MG PO TABS: 10.0000 mg | ORAL_TABLET | Freq: Every day | ORAL | 0 refills | Status: DC

## 2016-03-19 MED ORDER — LEFLUNOMIDE 20 MG PO TABS: 20.0000 mg | ORAL_TABLET | Freq: Every day | ORAL | 2 refills | Status: DC

## 2016-03-19 MED FILL — LEFLUNOMIDE 10 MG TABLET: 10 | 7 days supply | Qty: 7 | Fill #0

## 2016-03-19 MED FILL — predniSONE 5 MG TABS: 5 | 15 days supply | Qty: 32 | Fill #0

## 2016-03-19 MED FILL — LEFLUNOMIDE 20 MG TABLET: 20 | 30 days supply | Qty: 30 | Fill #0

## 2016-03-19 NOTE — Telephone Encounter (Signed)
I would advise the patient not to take her prednisone/Medrol dose taper.Since her MRI scheduled for 03/22/2016, it is best to wait until after her MRI.At that pointi'll be happy to give her a refill of her Medrol taper.Since her lab results are normal okay to send in a refill.Remind her to start with 10 mg once a day for the first week and then increase to her standard dose of 20 mg every day.I will need CBC with differential CMP with GFR in 1 month after starting the [place] and then every 2 months after that. Please write order for labs if needed

## 2016-03-19 NOTE — Telephone Encounter (Signed)
Patient advised of recommendations. Prescriptions have been sent to the pharmacy. Patient advised of lab schedule.

## 2016-03-19 NOTE — Telephone Encounter (Addendum)
Patient states she is schedule for a MRI on 03/23/15 Patient has finished the medrol dose pack that was given to her for the pain at the base of the left side of her skull. Patient states since finishing the medrol she is now hurting again. Patient is requesting a refill on the Medrol dose pack. She wants to know if this would affect her MRI results. Patient states she has tried tramadol and extra strength tylenol without relief. Patient's lab results are normal. Did you want to send in the [place]?

## 2016-03-20 MED FILL — HYDROCHLOROTHIAZIDE 12.5 MG: 12.5 | 90 days supply | Qty: 90 | Fill #2

## 2016-03-22 ENCOUNTER — Ambulatory Visit (HOSPITAL_COMMUNITY)
Admission: RE | Admit: 2016-03-22 | Discharge: 2016-03-22 | Disposition: A | Discharge status: Home / Self Care | Payer: 59 | Source: Ambulatory Visit | Attending: Rheumatology | Admitting: Rheumatology

## 2016-03-22 DIAGNOSIS — M47892 Other spondylosis, cervical region: Secondary | ICD-10-CM | POA: Insufficient documentation

## 2016-03-22 DIAGNOSIS — M542 Cervicalgia: Secondary | ICD-10-CM

## 2016-03-23 ENCOUNTER — Telehealth: Payer: Self-pay | Admitting: Rheumatology

## 2016-03-23 DIAGNOSIS — M542 Cervicalgia: Secondary | ICD-10-CM

## 2016-03-23 NOTE — Telephone Encounter (Signed)
Patient is requesting a call back about her MRI that was done yesterday. She states she has started the new medication, is still taking plaquenil, started prednisone this morning. She states she is still swelling and hurting and would like to touch base with Mr. Carlyon Shadow on the MRI.

## 2016-03-27 ENCOUNTER — Other Ambulatory Visit: Payer: Self-pay | Admitting: Rheumatology

## 2016-03-27 NOTE — Telephone Encounter (Signed)
Last Visit: 02/23/16 Next Visit due March 2018. Message sent to the front to schedule patient. Labs: 03/14/16 WNL PLQ Eye Exam: 05/2015 WNL  Okay to refill PLQ?

## 2016-03-27 NOTE — Telephone Encounter (Signed)
Advised patient of her MRI results. Patient verbalized understanding. Patient states she is feeling much better. Referral placed for Dr. Ellene Route.

## 2016-04-20 MED FILL — SERTRALINE HCL 100 MG TAB: 100 | 30 days supply | Qty: 30 | Fill #4

## 2016-05-03 MED FILL — LEFLUNOMIDE 20 MG TABLET: 20 | 30 days supply | Qty: 30 | Fill #1

## 2016-05-24 ENCOUNTER — Other Ambulatory Visit: Payer: Self-pay | Admitting: *Deleted

## 2016-05-24 DIAGNOSIS — M47812 Spondylosis without myelopathy or radiculopathy, cervical region: Secondary | ICD-10-CM | POA: Diagnosis not present

## 2016-05-24 DIAGNOSIS — Z79899 Other long term (current) drug therapy: Secondary | ICD-10-CM

## 2016-05-24 LAB — CBC WITH DIFFERENTIAL/PLATELET
BASOS ABS: 63 {cells}/uL (ref 0–200)
Basophils Relative: 1 %
Eosinophils Absolute: 252 cells/uL (ref 15–500)
Eosinophils Relative: 4 %
HEMATOCRIT: 39.7 % (ref 35.0–45.0)
HEMOGLOBIN: 13.2 g/dL (ref 11.7–15.5)
LYMPHS PCT: 19 %
Lymphs Abs: 1197 cells/uL (ref 850–3900)
MCH: 29.9 pg (ref 27.0–33.0)
MCHC: 33.2 g/dL (ref 32.0–36.0)
MCV: 89.8 fL (ref 80.0–100.0)
MONO ABS: 693 {cells}/uL (ref 200–950)
MPV: 9.9 fL (ref 7.5–12.5)
Monocytes Relative: 11 %
NEUTROS PCT: 65 %
Neutro Abs: 4095 cells/uL (ref 1500–7800)
Platelets: 325 10*3/uL (ref 140–400)
RBC: 4.42 MIL/uL (ref 3.80–5.10)
RDW: 13.2 % (ref 11.0–15.0)
WBC: 6.3 10*3/uL (ref 3.8–10.8)

## 2016-05-24 LAB — COMPLETE METABOLIC PANEL WITH GFR
ALBUMIN: 4.1 g/dL (ref 3.6–5.1)
ALK PHOS: 68 U/L (ref 33–130)
ALT: 49 U/L — ABNORMAL HIGH (ref 6–29)
AST: 37 U/L — AB (ref 10–35)
BUN: 16 mg/dL (ref 7–25)
CALCIUM: 9.4 mg/dL (ref 8.6–10.4)
CHLORIDE: 105 mmol/L (ref 98–110)
CO2: 25 mmol/L (ref 20–31)
Creat: 0.82 mg/dL (ref 0.50–1.05)
GFR, EST NON AFRICAN AMERICAN: 81 mL/min (ref >=60)
Glucose, Bld: 87 mg/dL (ref 65–99)
POTASSIUM: 3.9 mmol/L (ref 3.5–5.3)
Sodium: 141 mmol/L (ref 135–146)
Total Bilirubin: 0.6 mg/dL (ref 0.2–1.2)
Total Protein: 6.5 g/dL (ref 6.1–8.1)

## 2016-05-29 ENCOUNTER — Telehealth: Payer: Self-pay | Admitting: Radiology

## 2016-05-29 NOTE — Telephone Encounter (Signed)
I spoke to patient about her lab results today She has been using Extra strength tylenol, she will try to reduce this as much as possible.   You can sign off on her labs

## 2016-06-01 MED FILL — LEFLUNOMIDE 20 MG TABLET: 20 | 30 days supply | Qty: 30 | Fill #2

## 2016-06-01 MED FILL — HYDROCHLOROTHIAZIDE 12.5 MG: 12.5 | 90 days supply | Qty: 90 | Fill #3

## 2016-06-20 ENCOUNTER — Other Ambulatory Visit: Payer: Self-pay | Admitting: Rheumatology

## 2016-06-20 MED ORDER — TIZANIDINE HCL 4 MG PO TABS: 4.0000 mg | ORAL_TABLET | Freq: Every day | ORAL | 1 refills | Status: DC

## 2016-06-20 NOTE — Telephone Encounter (Signed)
Last Visit: 02/23/16 Next Visit: 07/06/16  Okay to refill Zanaflex?

## 2016-06-20 NOTE — Telephone Encounter (Signed)
Patient called requesting a refill on her tizanidine 4mg  tablet.  She stated that it has probably been since 03/2014.  She uses [facility].  CB#[phone].  Thank you

## 2016-06-25 NOTE — Progress Notes (Signed)
Office Visit Note  Patient: Kara Campbell             Date of Birth: 25-Oct-1960           MRN: [id number]             PCP: Allena Katz, MD Referring: Everlene Farrier, MD Visit Date: 07/06/2016 Occupation: @GUAROCC @    Subjective:  Follow up   History of Present Illness: Kara Campbell is a 56 y.o. female with history of rheumatoid arthritis. She started [place] about 2 months ago. She has not had any flare of her joint symptoms. Her right knee joint is doing better. She has had some stiffness in her neck for which she went to see Dr. Ellene Route. Who felt that the symptoms were related to the distal disease of the C-spine and not RA.  Activities of Daily Living:  Patient reports morning stiffness for 0 minutes.   Patient Denies nocturnal pain.  Difficulty dressing/grooming: Denies Difficulty climbing stairs: Denies Difficulty getting out of chair: Denies Difficulty using hands for taps, buttons, cutlery, and/or writing: Denies   Review of Systems  Constitutional: Negative for fatigue, night sweats, weight gain, weight loss and weakness.  HENT: Negative for mouth sores, trouble swallowing, trouble swallowing, mouth dryness and nose dryness.   Eyes: Negative for pain, redness, visual disturbance and dryness.  Respiratory: Negative for cough, shortness of breath and difficulty breathing.   Cardiovascular: Negative for chest pain, palpitations, hypertension, irregular heartbeat and swelling in legs/feet.  Gastrointestinal: Negative for blood in stool, constipation and diarrhea.  Endocrine: Negative for increased urination.  Genitourinary: Negative for vaginal dryness.  Musculoskeletal: Negative for arthralgias, joint pain, joint swelling, myalgias, muscle weakness, morning stiffness, muscle tenderness and myalgias.  Skin: Negative for color change, rash, hair loss, skin tightness, ulcers and sensitivity to sunlight.  Allergic/Immunologic: Negative for susceptible to infections.    Neurological: Negative for dizziness, memory loss and night sweats.  Hematological: Negative for swollen glands.  Psychiatric/Behavioral: Positive for depressed mood. Negative for sleep disturbance. The patient is not nervous/anxious.     PMFS History:  Patient Active Problem List   Diagnosis Date Noted  . Rheumatoid arthritis involving multiple sites with positive rheumatoid factor ([place]) 01/23/2016  . High risk medication use 01/23/2016  . Primary osteoarthritis of both hands 01/23/2016  . Primary osteoarthritis of both feet 01/23/2016  . Malignant melanoma ([place]) 01/23/2016  . Basal cell carcinoma 01/23/2016  . Anxiety 01/23/2016  . Chest congestion 06/07/2015    Past Medical History:  Diagnosis Date  . Arthritis   . Melanoma ([place]) rhe  . Rheumatoid arthritis(714.0)     Family History  Problem Relation Age of Onset  . Cancer Mother     breast  . Rheum arthritis Other    Past Surgical History:  Procedure Laterality Date  . INJECTION KNEE Right    Fluid removed from knee x2   . MELANOMA EXCISION     Social History   Social History Narrative  . No narrative on file     Objective: Vital Signs: BP 134/79 (BP Location: Left Arm, Patient Position: Sitting, Cuff Size: Normal)   Pulse (!) 57   Resp 13   Ht 5' 4"  (1.626 m)   Wt 156 lb (70.8 kg)   BMI 26.78 kg/m    Physical Exam  Constitutional: She is oriented to person, place, and time. She appears well-developed and well-nourished.  HENT:  Head: Normocephalic and atraumatic.  Eyes: Conjunctivae and EOM  are normal.  Neck: Normal range of motion.  Cardiovascular: Normal rate, regular rhythm, normal heart sounds and intact distal pulses.   Pulmonary/Chest: Effort normal and breath sounds normal.  Abdominal: Soft. Bowel sounds are normal.  Lymphadenopathy:    She has no cervical adenopathy.  Neurological: She is alert and oriented to person, place, and time.  Skin: Skin is warm and dry. Capillary refill takes  less than 2 seconds.  Psychiatric: She has a normal mood and affect. Her behavior is normal.  Nursing note and vitals reviewed.    Musculoskeletal Exam: Patient has limitation on lateral rotation of her C-spine. Flexion and extension was full. Thoracic and lumbar spine was good range of motion. Shoulder joints elbow joints wrist joints are good range of motion. She has mild synovitis and synovial thickening over her right second MCP joint. Hip joints knee joints ankles MTPs PIPs DIPs are good range of motion with no synovitis.  CDAI Exam: CDAI Homunculus Exam:   Tenderness:  Right hand: 2nd MCP  Swelling:  Right hand: 2nd MCP  Joint Counts:  CDAI Tender Joint count: 1 CDAI Swollen Joint count: 1  Global Assessments:  Patient Global Assessment: 0 Provider Global Assessment: 0    Investigation: No additional findings. Orders Only on 05/24/2016  Component Date Value Ref Range Status  . WBC 05/24/2016 6.3  3.8 - 10.8 K/uL Final  . RBC 05/24/2016 4.42  3.80 - 5.10 MIL/uL Final  . Hemoglobin 05/24/2016 13.2  11.7 - 15.5 g/dL Final  . HCT 05/24/2016 39.7  35.0 - 45.0 % Final  . MCV 05/24/2016 89.8  80.0 - 100.0 fL Final  . MCH 05/24/2016 29.9  27.0 - 33.0 pg Final  . MCHC 05/24/2016 33.2  32.0 - 36.0 g/dL Final  . RDW 05/24/2016 13.2  11.0 - 15.0 % Final  . Platelets 05/24/2016 325  140 - 400 K/uL Final  . MPV 05/24/2016 9.9  7.5 - 12.5 fL Final  . Neutro Abs 05/24/2016 4095  1,500 - 7,800 cells/uL Final  . Lymphs Abs 05/24/2016 1197  850 - 3,900 cells/uL Final  . Monocytes Absolute 05/24/2016 693  200 - 950 cells/uL Final  . Eosinophils Absolute 05/24/2016 252  15 - 500 cells/uL Final  . Basophils Absolute 05/24/2016 63  0 - 200 cells/uL Final  . Neutrophils Relative % 05/24/2016 65  % Final  . Lymphocytes Relative 05/24/2016 19  % Final  . Monocytes Relative 05/24/2016 11  % Final  . Eosinophils Relative 05/24/2016 4  % Final  . Basophils Relative 05/24/2016 1  % Final  .  Smear Review 05/24/2016 Criteria for review not met   Final  . Sodium 05/24/2016 141  135 - 146 mmol/L Final  . Potassium 05/24/2016 3.9  3.5 - 5.3 mmol/L Final  . Chloride 05/24/2016 105  98 - 110 mmol/L Final  . CO2 05/24/2016 25  20 - 31 mmol/L Final  . Glucose, Bld 05/24/2016 87  65 - 99 mg/dL Final  . BUN 05/24/2016 16  7 - 25 mg/dL Final  . Creat 05/24/2016 0.82  0.50 - 1.05 mg/dL Final   Comment:   For patients > or = 56 years of age: The upper reference limit for Creatinine is approximately 13% higher for people identified as African-American.     . Total Bilirubin 05/24/2016 0.6  0.2 - 1.2 mg/dL Final  . Alkaline Phosphatase 05/24/2016 68  33 - 130 U/L Final  . AST 05/24/2016 37* 10 - 35 U/L Final  .  ALT 05/24/2016 49* 6 - 29 U/L Final  . Total Protein 05/24/2016 6.5  6.1 - 8.1 g/dL Final  . Albumin 05/24/2016 4.1  3.6 - 5.1 g/dL Final  . Calcium 05/24/2016 9.4  8.6 - 10.4 mg/dL Final  . GFR, Est African American 05/24/2016 >89  >=60 mL/min Final  . GFR, Est Non African American 05/24/2016 81  >=60 mL/min Final     Imaging: No results found.  Speciality Comments: No specialty comments available.    Procedures:  No procedures performed Allergies: Amoxil [amoxicillin]   Assessment / Plan:     Visit Diagnoses: Rheumatoid arthritis involving multiple sites with positive rheumatoid factor :Patient had some synovitis in right second MCP joint. Although it is nontender. Her right knee joint swelling has resolved. She's been tolerating the [place] well without any side effects.  Primary osteoarthritis of both hands: She has some OA changes in her hands.  Primary osteoarthritis of both feet: Proper fitting shoes were discussed.  High risk medication use: She is on Arava 20 mg by mouth daily her LFTs were mildly elevated, we will check her LFTs again today.  Anxiety: She's been under a lot of stress at work.  History of melanoma  History of squamous cell carcinoma     Orders: No orders of the defined types were placed in this encounter.  No orders of the defined types were placed in this encounter.   Face-to-face time spent with patient was 30 minutes. 50% of time was spent in counseling and coordination of care.  Follow-Up Instructions: Return in about 3 months (around 10/05/2016) for Rheumatoid arthritis.   Bo Merino, MD  Note - This record has been created using Editor, commissioning.  Chart creation errors have been sought, but may not always  have been located. Such creation errors do not reflect on  the standard of medical care.

## 2016-07-06 ENCOUNTER — Ambulatory Visit (INDEPENDENT_AMBULATORY_CARE_PROVIDER_SITE_OTHER): Payer: 59 | Admitting: Rheumatology

## 2016-07-06 ENCOUNTER — Encounter: Payer: Self-pay | Admitting: Rheumatology

## 2016-07-06 VITALS — BP 134/79 | HR 57 | Resp 13 | Ht 64.0 in | Wt 156.0 lb

## 2016-07-06 DIAGNOSIS — F419 Anxiety disorder, unspecified: Secondary | ICD-10-CM

## 2016-07-06 DIAGNOSIS — D2271 Melanocytic nevi of right lower limb, including hip: Secondary | ICD-10-CM | POA: Diagnosis not present

## 2016-07-06 DIAGNOSIS — Z79899 Other long term (current) drug therapy: Secondary | ICD-10-CM

## 2016-07-06 DIAGNOSIS — M19041 Primary osteoarthritis, right hand: Secondary | ICD-10-CM | POA: Diagnosis not present

## 2016-07-06 DIAGNOSIS — Z85820 Personal history of malignant melanoma of skin: Secondary | ICD-10-CM | POA: Diagnosis not present

## 2016-07-06 DIAGNOSIS — M0579 Rheumatoid arthritis with rheumatoid factor of multiple sites without organ or systems involvement: Secondary | ICD-10-CM

## 2016-07-06 DIAGNOSIS — M19042 Primary osteoarthritis, left hand: Secondary | ICD-10-CM

## 2016-07-06 DIAGNOSIS — Z85828 Personal history of other malignant neoplasm of skin: Secondary | ICD-10-CM | POA: Diagnosis not present

## 2016-07-06 DIAGNOSIS — M19072 Primary osteoarthritis, left ankle and foot: Secondary | ICD-10-CM | POA: Diagnosis not present

## 2016-07-06 DIAGNOSIS — M19071 Primary osteoarthritis, right ankle and foot: Secondary | ICD-10-CM

## 2016-07-06 DIAGNOSIS — D2261 Melanocytic nevi of right upper limb, including shoulder: Secondary | ICD-10-CM | POA: Diagnosis not present

## 2016-07-06 DIAGNOSIS — L570 Actinic keratosis: Secondary | ICD-10-CM | POA: Diagnosis not present

## 2016-07-06 DIAGNOSIS — Z8589 Personal history of malignant neoplasm of other organs and systems: Secondary | ICD-10-CM

## 2016-07-06 DIAGNOSIS — D225 Melanocytic nevi of trunk: Secondary | ICD-10-CM | POA: Diagnosis not present

## 2016-07-06 DIAGNOSIS — D2222 Melanocytic nevi of left ear and external auricular canal: Secondary | ICD-10-CM | POA: Diagnosis not present

## 2016-07-06 DIAGNOSIS — D2262 Melanocytic nevi of left upper limb, including shoulder: Secondary | ICD-10-CM | POA: Diagnosis not present

## 2016-07-06 DIAGNOSIS — D2272 Melanocytic nevi of left lower limb, including hip: Secondary | ICD-10-CM | POA: Diagnosis not present

## 2016-07-06 LAB — COMPLETE METABOLIC PANEL WITH GFR
AG RATIO: 1.8 ratio (ref 1.0–2.5)
ALBUMIN: 4 g/dL (ref 3.6–5.1)
ALK PHOS: 54 U/L (ref 33–130)
ALT: 21 U/L (ref 6–29)
AST: 23 U/L (ref 10–35)
BUN / CREAT RATIO: 18.9 ratio (ref 6–22)
BUN: 14 mg/dL (ref 7–25)
CHLORIDE: 107 mmol/L (ref 98–110)
CO2: 26 mmol/L (ref 20–31)
Calcium: 8.8 mg/dL (ref 8.6–10.4)
Creat: 0.74 mg/dL (ref 0.50–1.05)
Globulin: 2.2 g/dL (ref 1.9–3.7)
Glucose, Bld: 84 mg/dL (ref 65–99)
POTASSIUM: 4.1 mmol/L (ref 3.5–5.3)
Sodium: 141 mmol/L (ref 135–146)
Total Bilirubin: 0.6 mg/dL (ref 0.2–1.2)
Total Protein: 6.2 g/dL (ref 6.1–8.1)

## 2016-07-07 NOTE — Progress Notes (Signed)
WNL

## 2016-07-10 ENCOUNTER — Other Ambulatory Visit: Payer: Self-pay | Admitting: Rheumatology

## 2016-07-10 MED FILL — SERTRALINE HCL 100 MG TAB: 100 | 30 days supply | Qty: 30 | Fill #5

## 2016-07-10 MED FILL — tiZANidine HCL 4 MG TABS: 4 | 90 days supply | Qty: 90 | Fill #0

## 2016-07-11 MED FILL — LEFLUNOMIDE 20 MG TABLET: 20 | 30 days supply | Qty: 30 | Fill #0

## 2016-07-11 NOTE — Telephone Encounter (Signed)
ok 

## 2016-07-11 NOTE — Telephone Encounter (Signed)
Last Visit: 07/06/16 Next Visit: 10/03/16 Labs: 07/06/16 WNL  Okay to refill Arava?

## 2016-08-20 MED FILL — LEFLUNOMIDE 20 MG TABLET: 20 | 30 days supply | Qty: 30 | Fill #1

## 2016-09-11 ENCOUNTER — Encounter: Payer: Self-pay | Admitting: Emergency Medicine

## 2016-09-11 ENCOUNTER — Ambulatory Visit (INDEPENDENT_AMBULATORY_CARE_PROVIDER_SITE_OTHER): Payer: 59 | Admitting: Emergency Medicine

## 2016-09-11 VITALS — BP 144/83 | HR 65 | Temp 98.1°F | Resp 17 | Ht 64.0 in | Wt 156.0 lb

## 2016-09-11 DIAGNOSIS — I10 Essential (primary) hypertension: Secondary | ICD-10-CM

## 2016-09-11 MED ORDER — LISINOPRIL-HYDROCHLOROTHIAZIDE 10-12.5 MG PO TABS: 1.0000 | ORAL_TABLET | Freq: Every day | ORAL | 3 refills | Status: DC

## 2016-09-11 MED FILL — LISINOPRIL-HCTZ 10-12.5 MG: 10-12.5 | 90 days supply | Qty: 90 | Fill #0

## 2016-09-11 NOTE — Patient Instructions (Addendum)
   IF you received an x-ray today, you will receive an invoice from [facility]. Please contact [facility] at [phone] with questions or concerns regarding your invoice.   IF you received labwork today, you will receive an invoice from [facility]. Please contact [facility] at [phone] with questions or concerns regarding your invoice.   Our billing staff will not be able to assist you with questions regarding bills from these companies.  You will be contacted with the lab results as soon as they are available. The fastest way to get your results is to activate your My Chart account. Instructions are located on the last page of this paperwork. If you have not heard from us regarding the results in 2 weeks, please contact this office.     Hypertension Hypertension is another name for high blood pressure. High blood pressure forces your heart to work harder to pump blood. This can cause problems over time. There are two numbers in a blood pressure reading. There is a top number (systolic) over a bottom number (diastolic). It is best to have a blood pressure below 120/80. Healthy choices can help lower your blood pressure. You may need medicine to help lower your blood pressure if:  Your blood pressure cannot be lowered with healthy choices.  Your blood pressure is higher than 130/80.  Follow these instructions at home: Eating and drinking  If directed, follow the DASH eating plan. This diet includes: ? Filling half of your plate at each meal with fruits and vegetables. ? Filling one quarter of your plate at each meal with whole grains. Whole grains include whole wheat pasta, brown rice, and whole grain bread. ? Eating or drinking low-fat dairy products, such as skim milk or low-fat yogurt. ? Filling one quarter of your plate at each meal with low-fat (lean) proteins. Low-fat proteins include fish, skinless chicken, eggs, beans, and tofu. ? Avoiding fatty meat, cured  and processed meat, or chicken with skin. ? Avoiding premade or processed food.  Eat less than 1,500 mg of salt (sodium) a day.  Limit alcohol use to no more than 1 drink a day for nonpregnant women and 2 drinks a day for men. One drink equals 12 oz of beer, 5 oz of wine, or 1 oz of hard liquor. Lifestyle  Work with your doctor to stay at a healthy weight or to lose weight. Ask your doctor what the best weight is for you.  Get at least 30 minutes of exercise that causes your heart to beat faster (aerobic exercise) most days of the week. This may include walking, swimming, or biking.  Get at least 30 minutes of exercise that strengthens your muscles (resistance exercise) at least 3 days a week. This may include lifting weights or pilates.  Do not use any products that contain nicotine or tobacco. This includes cigarettes and e-cigarettes. If you need help quitting, ask your doctor.  Check your blood pressure at home as told by your doctor.  Keep all follow-up visits as told by your doctor. This is important. Medicines  Take over-the-counter and prescription medicines only as told by your doctor. Follow directions carefully.  Do not skip doses of blood pressure medicine. The medicine does not work as well if you skip doses. Skipping doses also puts you at risk for problems.  Ask your doctor about side effects or reactions to medicines that you should watch for. Contact a doctor if:  You think you are having a reaction to the   medicine you are taking.  You have headaches that keep coming back (recurring).  You feel dizzy.  You have swelling in your ankles.  You have trouble with your vision. Get help right away if:  You get a very bad headache.  You start to feel confused.  You feel weak or numb.  You feel faint.  You get very bad pain in your: ? Chest. ? Belly (abdomen).  You throw up (vomit) more than once.  You have trouble breathing. Summary  Hypertension is  another name for high blood pressure.  Making healthy choices can help lower blood pressure. If your blood pressure cannot be controlled with healthy choices, you may need to take medicine. This information is not intended to replace advice given to you by your health care provider. Make sure you discuss any questions you have with your health care provider. Document Released: 08/15/2007 Document Revised: 01/25/2016 Document Reviewed: 01/25/2016 Elsevier Interactive Patient Education  2018 Elsevier Inc.  

## 2016-09-11 NOTE — Progress Notes (Signed)
Kara Campbell 56 y.o.   Chief Complaint  Patient presents with  . Follow-up    HBP     HISTORY OF PRESENT ILLNESS: This is a 56 y.o. female complaining of high BP; ran out of meds; off x 1 week. BP at cancer center today 150/100; at home she's been running 170's/100's. Asymptomatic.  HPI   Prior to Admission medications   Medication Sig Start Date End Date Taking? Authorizing Provider  cetirizine (ZYRTEC) 10 MG tablet Take 1 tablet (10 mg total) by mouth daily. 06/15/15  Yes Jaynee Eagles, PA-C  cholecalciferol (VITAMIN D) 1000 units tablet Take 2,000 Units by mouth daily.   Yes [provider]  hydrochlorothiazide (MICROZIDE) 12.5 MG capsule  06/01/16  Yes [provider]  leflunomide (ARAVA) 20 MG tablet TAKE 1 TABLET BY MOUTH DAILY. 07/11/16  Yes Deveshwar, Abel Presto, MD  sertraline (ZOLOFT) 100 MG tablet  02/08/16  Yes [provider]    Allergies  Allergen Reactions  . Amoxil [Amoxicillin] Rash    Patient Active Problem List   Diagnosis Date Noted  . Rheumatoid arthritis involving multiple sites with positive rheumatoid factor ([place]) 01/23/2016  . High risk medication use 01/23/2016  . Primary osteoarthritis of both hands 01/23/2016  . Primary osteoarthritis of both feet 01/23/2016  . Malignant melanoma ([place]) 01/23/2016  . Basal cell carcinoma 01/23/2016  . Anxiety 01/23/2016  . Chest congestion 06/07/2015    Past Medical History:  Diagnosis Date  . Arthritis   . Melanoma ([place]) rhe  . Rheumatoid arthritis(714.0)     Past Surgical History:  Procedure Laterality Date  . INJECTION KNEE Right    Fluid removed from knee x2   . MELANOMA EXCISION      Social History   Social History  . Marital status: Married    Spouse name: N/A  . Number of children: N/A  . Years of education: N/A   Occupational History  . Not on file.   Social History Main Topics  . Smoking status: Never Smoker  . Smokeless tobacco: Never Used  . Alcohol use Yes       Comment: social  . Drug use: No  . Sexual activity: No   Other Topics Concern  . Not on file   Social History Narrative  . No narrative on file    Family History  Problem Relation Age of Onset  . Cancer Mother        breast  . Rheum arthritis Other      Review of Systems  Constitutional: Negative.  Negative for chills and fever.  HENT: Negative.   Eyes: Negative.   Respiratory: Negative.  Negative for cough and shortness of breath.   Cardiovascular: Negative.  Negative for chest pain, palpitations and leg swelling.  Gastrointestinal: Negative.  Negative for abdominal pain, diarrhea, nausea and vomiting.  Musculoskeletal: Negative for back pain, myalgias and neck pain.  Skin: Negative.  Negative for rash.  Neurological: Negative.  Negative for dizziness, sensory change, focal weakness and headaches.  Endo/Heme/Allergies: Negative.   All other systems reviewed and are negative.  Vitals:   09/11/16 1346  BP: (!) 144/83  Pulse: 65  Resp: 17  Temp: 98.1 F (36.7 C)     Physical Exam  Constitutional: She is oriented to person, place, and time. She appears well-developed and well-nourished.  HENT:  Head: Normocephalic and atraumatic.  Nose: Nose normal.  Mouth/Throat: Oropharynx is clear and moist. No oropharyngeal exudate.  Eyes: Conjunctivae and EOM are  normal. Pupils are equal, round, and reactive to light.  Neck: Normal range of motion. Neck supple. No JVD present. No thyromegaly present.  Cardiovascular: Normal rate, regular rhythm and normal heart sounds.   Pulmonary/Chest: Effort normal and breath sounds normal.  Abdominal: Soft. She exhibits no distension. There is no tenderness.  Musculoskeletal: Normal range of motion.  Lymphadenopathy:    She has no cervical adenopathy.  Neurological: She is alert and oriented to person, place, and time. No sensory deficit. She exhibits normal muscle tone.  Skin: Skin is warm and dry. Capillary refill takes less than  2 seconds.  Psychiatric: She has a normal mood and affect. Her behavior is normal.  Vitals reviewed.    ASSESSMENT & PLAN: Deerica was seen today for follow-up.  Diagnoses and all orders for this visit:  Essential hypertension  Other orders -     lisinopril-hydrochlorothiazide (PRINZIDE,ZESTORETIC) 10-12.5 MG tablet; Take 1 tablet by mouth daily.    Patient Instructions       IF you received an x-ray today, you will receive an invoice from [facility]. Please contact [facility] at [phone] with questions or concerns regarding your invoice.   IF you received labwork today, you will receive an invoice from [facility]. Please contact [facility] at [phone] with questions or concerns regarding your invoice.   Our billing staff will not be able to assist you with questions regarding bills from these companies.  You will be contacted with the lab results as soon as they are available. The fastest way to get your results is to activate your My Chart account. Instructions are located on the last page of this paperwork. If you have not heard from [place] regarding the results in 2 weeks, please contact this office.      Hypertension Hypertension is another name for high blood pressure. High blood pressure forces your heart to work harder to pump blood. This can cause problems over time. There are two numbers in a blood pressure reading. There is a top number (systolic) over a bottom number (diastolic). It is best to have a blood pressure below 120/80. Healthy choices can help lower your blood pressure. You may need medicine to help lower your blood pressure if:  Your blood pressure cannot be lowered with healthy choices.  Your blood pressure is higher than 130/80.  Follow these instructions at home: Eating and drinking  If directed, follow the DASH eating plan. This diet includes: ? Filling half of your plate at each meal with fruits and vegetables. ? Filling  one quarter of your plate at each meal with whole grains. Whole grains include whole wheat pasta, brown rice, and whole grain bread. ? Eating or drinking low-fat dairy products, such as skim milk or low-fat yogurt. ? Filling one quarter of your plate at each meal with low-fat (lean) proteins. Low-fat proteins include fish, skinless chicken, eggs, beans, and tofu. ? Avoiding fatty meat, cured and processed meat, or chicken with skin. ? Avoiding premade or processed food.  Eat less than 1,500 mg of salt (sodium) a day.  Limit alcohol use to no more than 1 drink a day for nonpregnant women and 2 drinks a day for men. One drink equals 12 oz of beer, 5 oz of wine, or 1 oz of hard liquor. Lifestyle  Work with your doctor to stay at a healthy weight or to lose weight. Ask your doctor what the best weight is for you.  Get at least 30 minutes of exercise that  causes your heart to beat faster (aerobic exercise) most days of the week. This may include walking, swimming, or biking.  Get at least 30 minutes of exercise that strengthens your muscles (resistance exercise) at least 3 days a week. This may include lifting weights or pilates.  Do not use any products that contain nicotine or tobacco. This includes cigarettes and e-cigarettes. If you need help quitting, ask your doctor.  Check your blood pressure at home as told by your doctor.  Keep all follow-up visits as told by your doctor. This is important. Medicines  Take over-the-counter and prescription medicines only as told by your doctor. Follow directions carefully.  Do not skip doses of blood pressure medicine. The medicine does not work as well if you skip doses. Skipping doses also puts you at risk for problems.  Ask your doctor about side effects or reactions to medicines that you should watch for. Contact a doctor if:  You think you are having a reaction to the medicine you are taking.  You have headaches that keep coming back  (recurring).  You feel dizzy.  You have swelling in your ankles.  You have trouble with your vision. Get help right away if:  You get a very bad headache.  You start to feel confused.  You feel weak or numb.  You feel faint.  You get very bad pain in your: ? Chest. ? Belly (abdomen).  You throw up (vomit) more than once.  You have trouble breathing. Summary  Hypertension is another name for high blood pressure.  Making healthy choices can help lower blood pressure. If your blood pressure cannot be controlled with healthy choices, you may need to take medicine. This information is not intended to replace advice given to you by your health care provider. Make sure you discuss any questions you have with your health care provider. Document Released: 08/15/2007 Document Revised: 01/25/2016 Document Reviewed: 01/25/2016 Elsevier Interactive Patient Education  2018 Elsevier Inc.      Agustina Caroli, MD [facility]

## 2016-09-25 MED FILL — SERTRALINE HCL 100 MG TAB: 100 | 30 days supply | Qty: 30 | Fill #0

## 2016-09-28 NOTE — Progress Notes (Deleted)
Office Visit Note  Patient: Kara Campbell             Date of Birth: 03/24/1960           MRN: [id number]             PCP: Everlene Farrier, MD Referring: Everlene Farrier, MD Visit Date: 10/03/2016 Occupation: @GUAROCC @    Subjective:  No chief complaint on file.   History of Present Illness: Kara Campbell is a 56 y.o. female ***   Activities of Daily Living:  Patient reports morning stiffness for *** {minute/hour:19697}.   Patient {ACTIONS;DENIES/REPORTS:21021675::"Denies"} nocturnal pain.  Difficulty dressing/grooming: {ACTIONS;DENIES/REPORTS:21021675::"Denies"} Difficulty climbing stairs: {ACTIONS;DENIES/REPORTS:21021675::"Denies"} Difficulty getting out of chair: {ACTIONS;DENIES/REPORTS:21021675::"Denies"} Difficulty using hands for taps, buttons, cutlery, and/or writing: {ACTIONS;DENIES/REPORTS:21021675::"Denies"}   No Rheumatology ROS completed.   PMFS History:  Patient Active Problem List   Diagnosis Date Noted  . Essential hypertension 09/11/2016  . Rheumatoid arthritis involving multiple sites with positive rheumatoid factor ([place]) 01/23/2016  . High risk medication use 01/23/2016  . Primary osteoarthritis of both hands 01/23/2016  . Primary osteoarthritis of both feet 01/23/2016  . Malignant melanoma ([place]) 01/23/2016  . Basal cell carcinoma 01/23/2016  . Anxiety 01/23/2016  . Chest congestion 06/07/2015    Past Medical History:  Diagnosis Date  . Arthritis   . Melanoma ([place]) rhe  . Rheumatoid arthritis(714.0)     Family History  Problem Relation Age of Onset  . Cancer Mother        breast  . Rheum arthritis Other    Past Surgical History:  Procedure Laterality Date  . INJECTION KNEE Right    Fluid removed from knee x2   . MELANOMA EXCISION     Social History   Social History Narrative  . No narrative on file     Objective: Vital Signs: There were no vitals taken for this visit.   Physical Exam   Musculoskeletal Exam: ***  CDAI  Exam: No CDAI exam completed.    Investigation: No additional findings. CBC Latest Ref Rng & Units 05/24/2016 03/14/2016 01/25/2016  WBC 3.8 - 10.8 K/uL 6.3 6.1 8.8  Hemoglobin 11.7 - 15.5 g/dL 13.2 13.0 13.7  Hematocrit 35.0 - 45.0 % 39.7 39.7 40.9  Platelets 140 - 400 K/uL 325 279 362   CMP Latest Ref Rng & Units 07/06/2016 05/24/2016 03/14/2016  Glucose 65 - 99 mg/dL 84 87 83  BUN 7 - 25 mg/dL 14 16 13   Creatinine 0.50 - 1.05 mg/dL 0.74 0.82 0.76  Sodium 135 - 146 mmol/L 141 141 143  Potassium 3.5 - 5.3 mmol/L 4.1 3.9 4.1  Chloride 98 - 110 mmol/L 107 105 106  CO2 20 - 31 mmol/L 26 25 25   Calcium 8.6 - 10.4 mg/dL 8.8 9.4 9.2  Total Protein 6.1 - 8.1 g/dL 6.2 6.5 6.3  Total Bilirubin 0.2 - 1.2 mg/dL 0.6 0.6 0.9  Alkaline Phos 33 - 130 U/L 54 68 50  AST 10 - 35 U/L 23 37(H) 21  ALT 6 - 29 U/L 21 49(H) 15     Imaging: No results found.  Speciality Comments: No specialty comments available.    Procedures:  No procedures performed Allergies: Amoxil [amoxicillin]   Assessment / Plan:     Visit Diagnoses: Rheumatoid arthritis involving multiple sites with positive rheumatoid factor (HCC)  High risk medication use - Arava   Primary osteoarthritis of both hands (RA erosion Right ulnar styloid)  Primary osteoarthritis of both feet (RA erosions Bilateral MTP )  History of melanoma and basal cell  History of anxiety  History of hypertension    Orders: No orders of the defined types were placed in this encounter.  No orders of the defined types were placed in this encounter.   Face-to-face time spent with patient was *** minutes. 50% of time was spent in counseling and coordination of care.  Follow-Up Instructions: No Follow-up on file.   Amy Littrell, RT  Note - This record has been created using [employer].  Chart creation errors have been sought, but may not always  have been located. Such creation errors do not reflect on  the standard of medical  care.

## 2016-10-03 ENCOUNTER — Ambulatory Visit (INDEPENDENT_AMBULATORY_CARE_PROVIDER_SITE_OTHER): Payer: 59 | Admitting: Rheumatology

## 2016-10-04 MED FILL — LEFLUNOMIDE 20 MG TABLET: 20 | 30 days supply | Qty: 30 | Fill #2

## 2016-10-08 NOTE — Progress Notes (Signed)
A user error has taken place: erroneous encounter.

## 2016-10-16 DIAGNOSIS — Z8679 Personal history of other diseases of the circulatory system: Secondary | ICD-10-CM | POA: Insufficient documentation

## 2016-10-16 NOTE — Progress Notes (Signed)
Office Visit Note  Patient: Kara Campbell             Date of Birth: Dec 28, 1960           MRN: [id number]             PCP: Everlene Farrier, MD Referring: Everlene Farrier, MD Visit Date: 10/23/2016 Occupation: @GUAROCC @    Subjective:  Pain in right hand.   History of Present Illness: Kara Campbell is a 56 y.o. female with history of sero positive rheumatoid arthritis. She states she's been having some discomfort in her right second MCP joint. None of the other joints have been swollen or painful. She has been having difficulty getting going on and off of her left hand. Occasional discomfort in her right  shoulder.   Activities of Daily Living:  Patient reports morning stiffness for 0  minute.   Patient Denies nocturnal pain.  Difficulty dressing/grooming: Denies Difficulty climbing stairs: Denies Difficulty getting out of chair: Denies Difficulty using hands for taps, buttons, cutlery, and/or writing: Denies   Review of Systems  Constitutional: Negative.  Negative for fatigue, night sweats, weight gain, weight loss and weakness.  HENT: Negative for mouth sores, trouble swallowing, trouble swallowing, mouth dryness and nose dryness.   Eyes: Negative for pain, redness, visual disturbance and dryness.  Respiratory: Negative.  Negative for cough, shortness of breath and difficulty breathing.   Cardiovascular: Negative.  Positive for hypertension. Negative for chest pain, palpitations, irregular heartbeat and swelling in legs/feet.  Gastrointestinal: Negative.  Negative for blood in stool, constipation and diarrhea.  Endocrine: Negative.  Negative for increased urination.  Genitourinary: Negative.  Negative for nocturia and vaginal dryness.  Musculoskeletal: Positive for arthralgias and joint pain. Negative for joint swelling, myalgias, muscle weakness, morning stiffness, muscle tenderness and myalgias.  Skin: Negative.  Negative for color change, rash, hair loss, skin tightness,  ulcers and sensitivity to sunlight.  Allergic/Immunologic: Negative for susceptible to infections.  Neurological: Negative.  Negative for dizziness, headaches, memory loss and night sweats.  Hematological: Negative.  Negative for swollen glands.  Psychiatric/Behavioral: Negative.  Negative for depressed mood and sleep disturbance. The patient is not nervous/anxious.     PMFS History:  Patient Active Problem List   Diagnosis Date Noted  . History of hypertension 10/16/2016  . Essential hypertension 09/11/2016  . Rheumatoid arthritis involving multiple sites with positive rheumatoid factor ([place]) 01/23/2016  . High risk medication use 01/23/2016  . Primary osteoarthritis of both hands 01/23/2016  . Primary osteoarthritis of both feet 01/23/2016  . Malignant melanoma ([place]) 01/23/2016  . Basal cell carcinoma 01/23/2016  . Anxiety 01/23/2016  . Chest congestion 06/07/2015    Past Medical History:  Diagnosis Date  . Arthritis   . Melanoma ([place]) rhe  . Rheumatoid arthritis(714.0)     Family History  Problem Relation Age of Onset  . Cancer Mother        breast  . Rheum arthritis Other    Past Surgical History:  Procedure Laterality Date  . INJECTION KNEE Right    Fluid removed from knee x2   . MELANOMA EXCISION     Social History   Social History Narrative  . No narrative on file     Objective: Vital Signs: BP 127/69   Pulse (!) 59   Resp 14   Ht 5' 4.5" (1.638 m)   Wt 154 lb (69.9 kg)   BMI 26.03 kg/m    Physical Exam  Constitutional: She is oriented  to person, place, and time. She appears well-developed and well-nourished.  HENT:  Head: Normocephalic and atraumatic.  Eyes: Conjunctivae and EOM are normal.  Neck: Normal range of motion.  Cardiovascular: Normal rate, regular rhythm, normal heart sounds and intact distal pulses.   Pulmonary/Chest: Effort normal and breath sounds normal.  Abdominal: Soft. Bowel sounds are normal.  Lymphadenopathy:    She has no  cervical adenopathy.  Neurological: She is alert and oriented to person, place, and time.  Skin: Skin is warm and dry. Capillary refill takes less than 2 seconds.  Psychiatric: She has a normal mood and affect. Her behavior is normal.  Nursing note and vitals reviewed.    Musculoskeletal Exam: C-spine and thoracic lumbar spine good range of motion. Shoulder joints elbow joints wrist joints good range of motion. MCP thickening as described below. No synovitis was noted over left fourth PIP joint. Hip joints knee joints ankles MTPs with good range of motion. She has high arches with hammertoes.  CDAI Exam: CDAI Homunculus Exam:   Tenderness:  Right hand: 2nd MCP  Swelling:  Right hand: 2nd MCP  Joint Counts:  CDAI Tender Joint count: 1 CDAI Swollen Joint count: 1  Global Assessments:  Patient Global Assessment: 1 Provider Global Assessment: 1  CDAI Calculated Score: 4    Investigation: No additional findings. CBC Latest Ref Rng & Units 05/24/2016 03/14/2016 01/25/2016  WBC 3.8 - 10.8 K/uL 6.3 6.1 8.8  Hemoglobin 11.7 - 15.5 g/dL 13.2 13.0 13.7  Hematocrit 35.0 - 45.0 % 39.7 39.7 40.9  Platelets 140 - 400 K/uL 325 279 362   CMP Latest Ref Rng & Units 07/06/2016 05/24/2016 03/14/2016  Glucose 65 - 99 mg/dL 84 87 83  BUN 7 - 25 mg/dL 14 16 13   Creatinine 0.50 - 1.05 mg/dL 0.74 0.82 0.76  Sodium 135 - 146 mmol/L 141 141 143  Potassium 3.5 - 5.3 mmol/L 4.1 3.9 4.1  Chloride 98 - 110 mmol/L 107 105 106  CO2 20 - 31 mmol/L 26 25 25   Calcium 8.6 - 10.4 mg/dL 8.8 9.4 9.2  Total Protein 6.1 - 8.1 g/dL 6.2 6.5 6.3  Total Bilirubin 0.2 - 1.2 mg/dL 0.6 0.6 0.9  Alkaline Phos 33 - 130 U/L 54 68 50  AST 10 - 35 U/L 23 37(H) 21  ALT 6 - 29 U/L 21 49(H) 15    Imaging: No results found.  Speciality Comments: No specialty comments available.    Procedures:  No procedures performed Allergies: Amoxil [amoxicillin]   Assessment / Plan:     Visit Diagnoses: Rheumatoid arthritis  involving multiple sites with positive rheumatoid factor (HCC) Erosive disease : She continues to have some tenderness and discomfort in her right second MCP joint. She also feels that the ringing in her left hand is tight. I did feel some synovial thickening in her right second MCP joint is difficult to say this synovitis. None of the other joints reveal any synovitis. I will schedule ultrasound to evaluate this further. We discussed possible combination therapy with Plaquenil or sulfasalazine or methotrexate along with Arava in case she has ongoing synovitis. Biologic DMARD's will not be a choice with history of melanoma except for Rituxan.  High risk medication use - Arava 20 mg by mouth daily.  - Plan: CBC with Differential/Platelet, COMPLETE METABOLIC PANEL WITH GFR, today and then every 3 months. CBC with Differential/Platelet, COMPLETE METABOLIC PANEL WITH GFR  Primary osteoarthritis of both hands: She does have some stiffness  Primary osteoarthritis  of both feet: She has severe osteoarthritis in her feet with hammertoes. No synovitis was noted. Proper fitting shoes with metatarsal pads and arch support were discussed.  History of melanoma - 2002  History of basal cell cancer  History of hypertension: Her blood pressure is well controlled.  Anxiety    Orders: Orders Placed This Encounter  Procedures  . CBC with Differential/Platelet  . COMPLETE METABOLIC PANEL WITH GFR  . CBC with Differential/Platelet  . COMPLETE METABOLIC PANEL WITH GFR   No orders of the defined types were placed in this encounter.   Face-to-face time spent with patient was 30 minutes. Greater than 50% of time was spent in counseling and coordination of care.  Follow-Up Instructions: Return in about 5 months (around 03/25/2017) for Rheumatoid arthritis.   Bo Merino, MD  Note - This record has been created using [employer] commission[employer].  Chart creation errors have been sought, but may not always  have been  located. Such creation errors do not reflect on  the standard of medical care.

## 2016-10-23 ENCOUNTER — Encounter: Payer: Self-pay | Admitting: Rheumatology

## 2016-10-23 ENCOUNTER — Ambulatory Visit: Payer: 59 | Admitting: Rheumatology

## 2016-10-23 VITALS — BP 127/69 | HR 59 | Resp 14 | Ht 64.5 in | Wt 154.0 lb

## 2016-10-23 DIAGNOSIS — Z85820 Personal history of malignant melanoma of skin: Secondary | ICD-10-CM | POA: Diagnosis not present

## 2016-10-23 DIAGNOSIS — Z79899 Other long term (current) drug therapy: Secondary | ICD-10-CM

## 2016-10-23 DIAGNOSIS — F419 Anxiety disorder, unspecified: Secondary | ICD-10-CM | POA: Diagnosis not present

## 2016-10-23 DIAGNOSIS — Z8679 Personal history of other diseases of the circulatory system: Secondary | ICD-10-CM | POA: Diagnosis not present

## 2016-10-23 DIAGNOSIS — Z85828 Personal history of other malignant neoplasm of skin: Secondary | ICD-10-CM | POA: Diagnosis not present

## 2016-10-23 DIAGNOSIS — Z1231 Encounter for screening mammogram for malignant neoplasm of breast: Secondary | ICD-10-CM | POA: Diagnosis not present

## 2016-10-23 DIAGNOSIS — M19072 Primary osteoarthritis, left ankle and foot: Secondary | ICD-10-CM | POA: Diagnosis not present

## 2016-10-23 DIAGNOSIS — Z6826 Body mass index (BMI) 26.0-26.9, adult: Secondary | ICD-10-CM | POA: Diagnosis not present

## 2016-10-23 DIAGNOSIS — M19071 Primary osteoarthritis, right ankle and foot: Secondary | ICD-10-CM | POA: Diagnosis not present

## 2016-10-23 DIAGNOSIS — M0579 Rheumatoid arthritis with rheumatoid factor of multiple sites without organ or systems involvement: Secondary | ICD-10-CM | POA: Diagnosis not present

## 2016-10-23 DIAGNOSIS — M19042 Primary osteoarthritis, left hand: Secondary | ICD-10-CM

## 2016-10-23 DIAGNOSIS — M19041 Primary osteoarthritis, right hand: Secondary | ICD-10-CM

## 2016-10-23 DIAGNOSIS — Z01419 Encounter for gynecological examination (general) (routine) without abnormal findings: Secondary | ICD-10-CM | POA: Diagnosis not present

## 2016-10-23 DIAGNOSIS — Z13820 Encounter for screening for osteoporosis: Secondary | ICD-10-CM | POA: Diagnosis not present

## 2016-10-23 LAB — CBC WITH DIFFERENTIAL/PLATELET
BASOS ABS: 49 {cells}/uL (ref 0–200)
Basophils Relative: 1 %
EOS PCT: 5 %
Eosinophils Absolute: 245 cells/uL (ref 15–500)
HEMATOCRIT: 36.9 % (ref 35.0–45.0)
HEMOGLOBIN: 12.4 g/dL (ref 11.7–15.5)
LYMPHS ABS: 1127 {cells}/uL (ref 850–3900)
LYMPHS PCT: 23 %
MCH: 30.3 pg (ref 27.0–33.0)
MCHC: 33.6 g/dL (ref 32.0–36.0)
MCV: 90.2 fL (ref 80.0–100.0)
MPV: 9.8 fL (ref 7.5–12.5)
Monocytes Absolute: 392 cells/uL (ref 200–950)
Monocytes Relative: 8 %
NEUTROS PCT: 63 %
Neutro Abs: 3087 cells/uL (ref 1500–7800)
Platelets: 277 10*3/uL (ref 140–400)
RBC: 4.09 MIL/uL (ref 3.80–5.10)
RDW: 13.3 % (ref 11.0–15.0)
WBC: 4.9 10*3/uL (ref 3.8–10.8)

## 2016-10-23 MED FILL — SERTRALINE HCL 50 MG TABLET: 50 | 30 days supply | Qty: 30 | Fill #0

## 2016-10-23 NOTE — Progress Notes (Signed)
Rheumatology Medication Review by a Pharmacist Does the patient feel that his/her medications are working for him/her?  Yes Has the patient been experiencing any side effects to the medications prescribed?  No Does the patient have any problems obtaining medications?  No  Issues to address at subsequent visits: None   Pharmacist comments:  Kara Campbell is a pleasant 56 yo F who presents for follow up of rheumatoid arthritis.  She is currently taking leflunomide 20 mg daily.  Most recent CBC was normal on 05/24/16.  Most recent CMP was normal on 07/06/16.  Patient is past due for standing labs today.  I reminded patient she will need standing labs every 3 months.  Patient voiced understanding and denies any questions or concerns regarding her medications at this time.   [name], Pharm.D., BCPS, CPP Clinical Pharmacist Pager: [phone] Phone: [phone] 10/23/2016 8:50 AM

## 2016-10-23 NOTE — Patient Instructions (Signed)
Standing Labs We placed an order today for your standing lab work.    Please come back and get your standing labs in November 2018 and every 3 months.  We have open lab Monday through Friday from 8:30-11:30 AM and 1:30-4 PM at the office of Dr. Shaili Deveshwar.   The office is located at [address], [address], [place], [place] [address] No appointment is necessary.   Labs are drawn by [facility].  You may receive a bill from [facility] for your lab work. If you have any questions regarding directions or hours of operation,  please call [phone].     

## 2016-10-24 LAB — COMPLETE METABOLIC PANEL WITH GFR
ALBUMIN: 4.2 g/dL (ref 3.6–5.1)
ALT: 15 U/L (ref 6–29)
AST: 20 U/L (ref 10–35)
Alkaline Phosphatase: 59 U/L (ref 33–130)
BUN: 13 mg/dL (ref 7–25)
CALCIUM: 9.2 mg/dL (ref 8.6–10.4)
CHLORIDE: 106 mmol/L (ref 98–110)
CO2: 24 mmol/L (ref 20–32)
CREATININE: 0.8 mg/dL (ref 0.50–1.05)
GFR, EST NON AFRICAN AMERICAN: 83 mL/min (ref >=60)
GFR, Est African American: >89 mL/min (ref >=60)
Glucose, Bld: 76 mg/dL (ref 65–99)
POTASSIUM: 4.4 mmol/L (ref 3.5–5.3)
SODIUM: 143 mmol/L (ref 135–146)
Total Bilirubin: 0.7 mg/dL (ref 0.2–1.2)
Total Protein: 6.4 g/dL (ref 6.1–8.1)

## 2016-10-24 NOTE — Progress Notes (Signed)
WNLs

## 2016-11-05 ENCOUNTER — Other Ambulatory Visit: Payer: Self-pay | Admitting: Rheumatology

## 2016-11-05 MED FILL — LEFLUNOMIDE 20 MG TABLET: 20 | 30 days supply | Qty: 30 | Fill #0

## 2016-11-05 NOTE — Telephone Encounter (Signed)
Last Visit: 10/23/16 Next Visit: 02/27/17 Labs: 10/23/16 WNL  Okay to refill per Dr. Estanislado Pandy

## 2016-11-06 ENCOUNTER — Telehealth: Payer: Self-pay | Admitting: Radiology

## 2016-11-06 NOTE — Telephone Encounter (Signed)
DEXA received from Dr Gaetano Net office he has advised patient of Osteopenia and told her to use Vitamin D Calcium and exercise, and repeat in 2 yrs, copy sent for scanning.

## 2016-11-27 DIAGNOSIS — Z6825 Body mass index (BMI) 25.0-25.9, adult: Secondary | ICD-10-CM | POA: Diagnosis not present

## 2016-11-27 DIAGNOSIS — J309 Allergic rhinitis, unspecified: Secondary | ICD-10-CM | POA: Diagnosis not present

## 2016-11-27 DIAGNOSIS — M069 Rheumatoid arthritis, unspecified: Secondary | ICD-10-CM | POA: Diagnosis not present

## 2016-11-27 DIAGNOSIS — I10 Essential (primary) hypertension: Secondary | ICD-10-CM | POA: Diagnosis not present

## 2016-11-27 MED FILL — MONTELUKAST SOD 10 MG TAB: 10 | 90 days supply | Qty: 90 | Fill #0

## 2016-11-27 MED FILL — AZELASTINE 0.1% (137 MCG) S: 0.1 | 30 days supply | Qty: 30 | Fill #0

## 2016-12-17 MED FILL — LEFLUNOMIDE 20 MG TABLET: 20 | 30 days supply | Qty: 30 | Fill #1

## 2016-12-17 MED FILL — LISINOPRIL-HCTZ 10-12.5 MG: 10-12.5 | 90 days supply | Qty: 90 | Fill #1

## 2016-12-27 DIAGNOSIS — S93492A Sprain of other ligament of left ankle, initial encounter: Secondary | ICD-10-CM | POA: Diagnosis not present

## 2016-12-28 DIAGNOSIS — Z6826 Body mass index (BMI) 26.0-26.9, adult: Secondary | ICD-10-CM | POA: Diagnosis not present

## 2016-12-28 DIAGNOSIS — J329 Chronic sinusitis, unspecified: Secondary | ICD-10-CM | POA: Diagnosis not present

## 2016-12-28 DIAGNOSIS — J309 Allergic rhinitis, unspecified: Secondary | ICD-10-CM | POA: Diagnosis not present

## 2016-12-28 MED FILL — CEFDINIR 300 MG CAPSULE: 300 | 10 days supply | Qty: 20 | Fill #0

## 2017-01-16 DIAGNOSIS — Z0000 Encounter for general adult medical examination without abnormal findings: Secondary | ICD-10-CM | POA: Diagnosis not present

## 2017-01-22 ENCOUNTER — Telehealth: Payer: Self-pay | Admitting: Rheumatology

## 2017-01-22 MED FILL — LEFLUNOMIDE 20 MG TABLET: 20 | 30 days supply | Qty: 30 | Fill #2

## 2017-01-22 NOTE — Telephone Encounter (Signed)
Patient calling requesting a prednisone dose pack to be called into [facility] for her rt index finger. Per patient hand pain increased last few days. Please call to advise.

## 2017-01-22 NOTE — Telephone Encounter (Signed)
Ok to give Pred taper starting at 20 mg and taper by 5mg  every 4 days.

## 2017-01-22 NOTE — Telephone Encounter (Signed)
Patient states she is having pain in the knuckles on her right hand. Patient is having more pain in the first finger on her right hand. Patient is right hand dominant. Patient states she has an ultrasound scheduled for after the first of the year. Patient is requesting a prescription for prednisone. Please advise.

## 2017-01-23 MED ORDER — PREDNISONE 5 MG PO TABS: ORAL_TABLET | ORAL | 0 refills | Status: DC

## 2017-01-23 MED FILL — predniSONE 5 MG TABS: 5 | 16 days supply | Qty: 40 | Fill #0

## 2017-01-23 NOTE — Telephone Encounter (Signed)
Prescription sent to the pharmacy.  Patient notified.

## 2017-01-23 NOTE — Addendum Note (Signed)
Addended by: Francis Gaines C on: 01/23/2017 03:10 PM   Modules accepted: Orders

## 2017-01-24 DIAGNOSIS — Z6825 Body mass index (BMI) 25.0-25.9, adult: Secondary | ICD-10-CM | POA: Diagnosis not present

## 2017-01-24 DIAGNOSIS — Z0000 Encounter for general adult medical examination without abnormal findings: Secondary | ICD-10-CM | POA: Diagnosis not present

## 2017-01-25 MED FILL — SERTRALINE HCL 100 MG TAB: 100 | 30 days supply | Qty: 30 | Fill #0

## 2017-01-25 MED FILL — FLUTICASONE PROP 50 MCG SPR: 50 | 30 days supply | Qty: 16 | Fill #0

## 2017-02-27 ENCOUNTER — Other Ambulatory Visit: Payer: 59 | Admitting: Rheumatology

## 2017-03-01 ENCOUNTER — Other Ambulatory Visit: Payer: Self-pay | Admitting: Rheumatology

## 2017-03-01 ENCOUNTER — Telehealth: Payer: Self-pay | Admitting: Rheumatology

## 2017-03-01 ENCOUNTER — Other Ambulatory Visit: Payer: Self-pay

## 2017-03-01 DIAGNOSIS — Z79899 Other long term (current) drug therapy: Secondary | ICD-10-CM | POA: Diagnosis not present

## 2017-03-01 LAB — COMPLETE METABOLIC PANEL WITH GFR
AG RATIO: 1.7 (calc) (ref 1.0–2.5)
ALBUMIN MSPROF: 4 g/dL (ref 3.6–5.1)
ALT: 20 U/L (ref 6–29)
AST: 23 U/L (ref 10–35)
Alkaline phosphatase (APISO): 53 U/L (ref 33–130)
BILIRUBIN TOTAL: 0.5 mg/dL (ref 0.2–1.2)
BUN: 12 mg/dL (ref 7–25)
CALCIUM: 9.2 mg/dL (ref 8.6–10.4)
CHLORIDE: 104 mmol/L (ref 98–110)
CO2: 29 mmol/L (ref 20–32)
Creat: 0.71 mg/dL (ref 0.50–1.05)
GFR, EST AFRICAN AMERICAN: 110 mL/min/{1.73_m2} (ref >=60)
GFR, EST NON AFRICAN AMERICAN: 95 mL/min/{1.73_m2} (ref >=60)
Globulin: 2.3 g/dL (calc) (ref 1.9–3.7)
Glucose, Bld: 79 mg/dL (ref 65–99)
Potassium: 4.1 mmol/L (ref 3.5–5.3)
Sodium: 143 mmol/L (ref 135–146)
TOTAL PROTEIN: 6.3 g/dL (ref 6.1–8.1)

## 2017-03-01 LAB — CBC WITH DIFFERENTIAL/PLATELET
BASOS ABS: 69 {cells}/uL (ref 0–200)
Basophils Relative: 1.3 %
EOS ABS: 504 {cells}/uL — AB (ref 15–500)
Eosinophils Relative: 9.5 %
HEMATOCRIT: 37.9 % (ref 35.0–45.0)
HEMOGLOBIN: 12.7 g/dL (ref 11.7–15.5)
LYMPHS ABS: 1182 {cells}/uL (ref 850–3900)
MCH: 30 pg (ref 27.0–33.0)
MCHC: 33.5 g/dL (ref 32.0–36.0)
MCV: 89.6 fL (ref 80.0–100.0)
MPV: 10.3 fL (ref 7.5–12.5)
Monocytes Relative: 10.2 %
NEUTROS ABS: 3005 {cells}/uL (ref 1500–7800)
NEUTROS PCT: 56.7 %
Platelets: 297 10*3/uL (ref 140–400)
RBC: 4.23 10*6/uL (ref 3.80–5.10)
RDW: 12.1 % (ref 11.0–15.0)
Total Lymphocyte: 22.3 %
WBC mixed population: 541 cells/uL (ref 200–950)
WBC: 5.3 10*3/uL (ref 3.8–10.8)

## 2017-03-01 MED FILL — LEFLUNOMIDE 20 MG TABLET: 20 | 30 days supply | Qty: 30 | Fill #0

## 2017-03-01 NOTE — Telephone Encounter (Signed)
Last Visit: 10/23/16 Next Visit: 04/10/17 Labs: 10/23/16 WNL  Left message to advise patient she is due for labs.   Okay to refill 30 day supply  per Dr. Estanislado Pandy

## 2017-03-01 NOTE — Telephone Encounter (Signed)
Pt would like a prescription refill on Leflunomide.  [facility].  Her call back [phone]  Patient stated she may come in to do lab work today.  Please Advise

## 2017-03-01 NOTE — Telephone Encounter (Signed)
Prescription for a 30 day supply of [place] sent to pharmacy. With next refill as long as labs have been update will be able to fill a 90 day supply.

## 2017-03-06 NOTE — Progress Notes (Signed)
wn[facility]

## 2017-03-07 ENCOUNTER — Encounter: Payer: Self-pay | Admitting: Family Medicine

## 2017-03-27 NOTE — Progress Notes (Deleted)
Office Visit Note  Patient: Kara Campbell             Date of Birth: 1960-07-15           MRN: [id number]             PCP: Everlene Farrier, MD Referring: Everlene Farrier, MD Visit Date: 04/10/2017 Occupation: @GUAROCC @    Subjective:  No chief complaint on file.   History of Present Illness: Kara Campbell is a 57 y.o. female ***   Activities of Daily Living:  Patient reports morning stiffness for *** {minute/hour:19697}.   Patient {ACTIONS;DENIES/REPORTS:21021675::"Denies"} nocturnal pain.  Difficulty dressing/grooming: {ACTIONS;DENIES/REPORTS:21021675::"Denies"} Difficulty climbing stairs: {ACTIONS;DENIES/REPORTS:21021675::"Denies"} Difficulty getting out of chair: {ACTIONS;DENIES/REPORTS:21021675::"Denies"} Difficulty using hands for taps, buttons, cutlery, and/or writing: {ACTIONS;DENIES/REPORTS:21021675::"Denies"}   No Rheumatology ROS completed.   PMFS History:  Patient Active Problem List   Diagnosis Date Noted  . History of hypertension 10/16/2016  . Essential hypertension 09/11/2016  . Rheumatoid arthritis involving multiple sites with positive rheumatoid factor ([place]) 01/23/2016  . High risk medication use 01/23/2016  . Primary osteoarthritis of both hands 01/23/2016  . Primary osteoarthritis of both feet 01/23/2016  . Malignant melanoma ([place]) 01/23/2016  . Basal cell carcinoma 01/23/2016  . Anxiety 01/23/2016  . Chest congestion 06/07/2015    Past Medical History:  Diagnosis Date  . Arthritis   . Melanoma ([place]) rhe  . Rheumatoid arthritis(714.0)     Family History  Problem Relation Age of Onset  . Cancer Mother        breast  . Rheum arthritis Other    Past Surgical History:  Procedure Laterality Date  . INJECTION KNEE Right    Fluid removed from knee x2   . MELANOMA EXCISION     Social History   Social History Narrative  . Not on file     Objective: Vital Signs: There were no vitals taken for this visit.   Physical Exam    Musculoskeletal Exam: ***  CDAI Exam: No CDAI exam completed.    Investigation: No additional findings. CBC Latest Ref Rng & Units 03/01/2017 10/23/2016 05/24/2016  WBC 3.8 - 10.8 Thousand/uL 5.3 4.9 6.3  Hemoglobin 11.7 - 15.5 g/dL 12.7 12.4 13.2  Hematocrit 35.0 - 45.0 % 37.9 36.9 39.7  Platelets 140 - 400 Thousand/uL 297 277 325   CMP Latest Ref Rng & Units 03/01/2017 10/23/2016 07/06/2016  Glucose 65 - 99 mg/dL 79 76 84  BUN 7 - 25 mg/dL 12 13 14   Creatinine 0.50 - 1.05 mg/dL 0.71 0.80 0.74  Sodium 135 - 146 mmol/L 143 143 141  Potassium 3.5 - 5.3 mmol/L 4.1 4.4 4.1  Chloride 98 - 110 mmol/L 104 106 107  CO2 20 - 32 mmol/L 29 24 26   Calcium 8.6 - 10.4 mg/dL 9.2 9.2 8.8  Total Protein 6.1 - 8.1 g/dL 6.3 6.4 6.2  Total Bilirubin 0.2 - 1.2 mg/dL 0.5 0.7 0.6  Alkaline Phos 33 - 130 U/L - 59 54  AST 10 - 35 U/L 23 20 23   ALT 6 - 29 U/L 20 15 21     Imaging: No results found.  Speciality Comments: No specialty comments available.    Procedures:  No procedures performed Allergies: Amoxil [amoxicillin]   Assessment / Plan:     Visit Diagnoses: No diagnosis found.    Orders: No orders of the defined types were placed in this encounter.  No orders of the defined types were placed in this encounter.   Face-to-face time spent  with patient was *** minutes. 50% of time was spent in counseling and coordination of care.  Follow-Up Instructions: No Follow-up on file.   Earnestine Mealing, CMA  Note - This record has been created using Editor, commissioning.  Chart creation errors have been sought, but may not always  have been located. Such creation errors do not reflect on  the standard of medical care.

## 2017-04-01 ENCOUNTER — Other Ambulatory Visit: Payer: Self-pay | Admitting: Rheumatology

## 2017-04-01 MED FILL — LEFLUNOMIDE 20 MG TABLET: 20 | 30 days supply | Qty: 30 | Fill #0

## 2017-04-01 NOTE — Telephone Encounter (Signed)
Last Visit: 10/23/16 Next Visit: 04/10/17 Labs: 03/01/17 WNL  Okay to refill per Dr. Estanislado Pandy

## 2017-04-08 ENCOUNTER — Telehealth: Payer: Self-pay | Admitting: Rheumatology

## 2017-04-08 NOTE — Telephone Encounter (Signed)
Called patient and advised patient of when she is due for labs. Also, rescheduled patient's office visit for 04/30/2017.

## 2017-04-08 NOTE — Telephone Encounter (Signed)
Patient called because she had to CX her ultrasound appointment on Wednesday because they are short staffed at work.  She wants to know when her bloodwork is due.  Patient's CB# [phone]

## 2017-04-10 ENCOUNTER — Ambulatory Visit: Payer: 59 | Admitting: Rheumatology

## 2017-04-10 ENCOUNTER — Other Ambulatory Visit: Payer: 59 | Admitting: Rheumatology

## 2017-04-10 MED FILL — LISINOPRIL-HCTZ 10-12.5 MG: 10-12.5 | 90 days supply | Qty: 90 | Fill #2

## 2017-04-16 NOTE — Progress Notes (Deleted)
Office Visit Note  Patient: Kara Campbell             Date of Birth: 01-Mar-1961           MRN: [id number]             PCP: Everlene Farrier, MD Referring: Everlene Farrier, MD Visit Date: 04/30/2017 Occupation: @GUAROCC @    Subjective:  No chief complaint on file.   History of Present Illness: Kara Campbell is a 57 y.o. female ***   Activities of Daily Living:  Patient reports morning stiffness for *** {minute/hour:19697}.   Patient {ACTIONS;DENIES/REPORTS:21021675::"Denies"} nocturnal pain.  Difficulty dressing/grooming: {ACTIONS;DENIES/REPORTS:21021675::"Denies"} Difficulty climbing stairs: {ACTIONS;DENIES/REPORTS:21021675::"Denies"} Difficulty getting out of chair: {ACTIONS;DENIES/REPORTS:21021675::"Denies"} Difficulty using hands for taps, buttons, cutlery, and/or writing: {ACTIONS;DENIES/REPORTS:21021675::"Denies"}   No Rheumatology ROS completed.   PMFS History:  Patient Active Problem List   Diagnosis Date Noted  . History of hypertension 10/16/2016  . Essential hypertension 09/11/2016  . Rheumatoid arthritis involving multiple sites with positive rheumatoid factor ([place]) 01/23/2016  . High risk medication use 01/23/2016  . Primary osteoarthritis of both hands 01/23/2016  . Primary osteoarthritis of both feet 01/23/2016  . Malignant melanoma ([place]) 01/23/2016  . Basal cell carcinoma 01/23/2016  . Anxiety 01/23/2016  . Chest congestion 06/07/2015    Past Medical History:  Diagnosis Date  . Arthritis   . Melanoma ([place]) rhe  . Rheumatoid arthritis(714.0)     Family History  Problem Relation Age of Onset  . Cancer Mother        breast  . Rheum arthritis Other    Past Surgical History:  Procedure Laterality Date  . INJECTION KNEE Right    Fluid removed from knee x2   . MELANOMA EXCISION     Social History   Social History Narrative  . Not on file     Objective: Vital Signs: There were no vitals taken for this visit.   Physical Exam    Musculoskeletal Exam: ***  CDAI Exam: No CDAI exam completed.    Investigation: No additional findings. CBC Latest Ref Rng & Units 03/01/2017 10/23/2016 05/24/2016  WBC 3.8 - 10.8 Thousand/uL 5.3 4.9 6.3  Hemoglobin 11.7 - 15.5 g/dL 12.7 12.4 13.2  Hematocrit 35.0 - 45.0 % 37.9 36.9 39.7  Platelets 140 - 400 Thousand/uL 297 277 325   CMP Latest Ref Rng & Units 03/01/2017 10/23/2016 07/06/2016  Glucose 65 - 99 mg/dL 79 76 84  BUN 7 - 25 mg/dL 12 13 14   Creatinine 0.50 - 1.05 mg/dL 0.71 0.80 0.74  Sodium 135 - 146 mmol/L 143 143 141  Potassium 3.5 - 5.3 mmol/L 4.1 4.4 4.1  Chloride 98 - 110 mmol/L 104 106 107  CO2 20 - 32 mmol/L 29 24 26   Calcium 8.6 - 10.4 mg/dL 9.2 9.2 8.8  Total Protein 6.1 - 8.1 g/dL 6.3 6.4 6.2  Total Bilirubin 0.2 - 1.2 mg/dL 0.5 0.7 0.6  Alkaline Phos 33 - 130 U/L - 59 54  AST 10 - 35 U/L 23 20 23   ALT 6 - 29 U/L 20 15 21     Imaging: No results found.  Speciality Comments: No specialty comments available.    Procedures:  No procedures performed Allergies: Amoxil [amoxicillin]   Assessment / Plan:     Visit Diagnoses: No diagnosis found.    Orders: No orders of the defined types were placed in this encounter.  No orders of the defined types were placed in this encounter.   Face-to-face time spent  with patient was *** minutes. 50% of time was spent in counseling and coordination of care.  Follow-Up Instructions: No Follow-up on file.   Earnestine Mealing, CMA  Note - This record has been created using [employer], commissioning.  Chart creation errors have been sought, but may not always  have been located. Such creation errors do not reflect on  the standard of medical care.

## 2017-04-30 ENCOUNTER — Ambulatory Visit: Payer: 59 | Admitting: Rheumatology

## 2017-05-08 MED FILL — LEFLUNOMIDE 20 MG TABLET: 20 | 30 days supply | Qty: 30 | Fill #1

## 2017-06-10 MED FILL — SERTRALINE HCL 100 MG TAB: 100 | 30 days supply | Qty: 30 | Fill #1

## 2017-06-10 MED FILL — LEFLUNOMIDE 20 MG TABLET: 20 | 30 days supply | Qty: 30 | Fill #2

## 2017-06-12 DIAGNOSIS — Z6825 Body mass index (BMI) 25.0-25.9, adult: Secondary | ICD-10-CM | POA: Diagnosis not present

## 2017-06-12 DIAGNOSIS — J069 Acute upper respiratory infection, unspecified: Secondary | ICD-10-CM | POA: Diagnosis not present

## 2017-06-12 DIAGNOSIS — J309 Allergic rhinitis, unspecified: Secondary | ICD-10-CM | POA: Diagnosis not present

## 2017-06-12 MED FILL — MONTELUKAST SOD 10 MG TAB: 10 | 90 days supply | Qty: 90 | Fill #0

## 2017-06-16 DIAGNOSIS — H524 Presbyopia: Secondary | ICD-10-CM | POA: Diagnosis not present

## 2017-06-20 DIAGNOSIS — Z6825 Body mass index (BMI) 25.0-25.9, adult: Secondary | ICD-10-CM | POA: Diagnosis not present

## 2017-06-20 DIAGNOSIS — J309 Allergic rhinitis, unspecified: Secondary | ICD-10-CM | POA: Diagnosis not present

## 2017-06-20 DIAGNOSIS — H6691 Otitis media, unspecified, right ear: Secondary | ICD-10-CM | POA: Diagnosis not present

## 2017-06-20 MED FILL — CEFDINIR 300 MG CAPS: 300 | 7 days supply | Qty: 14 | Fill #0

## 2017-07-11 ENCOUNTER — Other Ambulatory Visit: Payer: Self-pay | Admitting: Rheumatology

## 2017-07-11 MED FILL — LISINOPRIL-HCTZ 10-12.5 MG: 10-12.5 | 90 days supply | Qty: 90 | Fill #3

## 2017-07-18 ENCOUNTER — Other Ambulatory Visit: Payer: Self-pay | Admitting: *Deleted

## 2017-07-18 DIAGNOSIS — Z79899 Other long term (current) drug therapy: Secondary | ICD-10-CM

## 2017-07-18 LAB — COMPLETE METABOLIC PANEL WITH GFR
AG RATIO: 2 (calc) (ref 1.0–2.5)
ALT: 18 U/L (ref 6–29)
AST: 20 U/L (ref 10–35)
Albumin: 4.3 g/dL (ref 3.6–5.1)
Alkaline phosphatase (APISO): 60 U/L (ref 33–130)
BUN: 13 mg/dL (ref 7–25)
CALCIUM: 9.3 mg/dL (ref 8.6–10.4)
CO2: 30 mmol/L (ref 20–32)
Chloride: 106 mmol/L (ref 98–110)
Creat: 0.68 mg/dL (ref 0.50–1.05)
GFR, EST AFRICAN AMERICAN: 113 mL/min/{1.73_m2} (ref >=60)
GFR, EST NON AFRICAN AMERICAN: 98 mL/min/{1.73_m2} (ref >=60)
GLOBULIN: 2.2 g/dL (ref 1.9–3.7)
Glucose, Bld: 110 mg/dL — ABNORMAL HIGH (ref 65–99)
POTASSIUM: 4.3 mmol/L (ref 3.5–5.3)
Sodium: 143 mmol/L (ref 135–146)
TOTAL PROTEIN: 6.5 g/dL (ref 6.1–8.1)
Total Bilirubin: 0.6 mg/dL (ref 0.2–1.2)

## 2017-07-18 LAB — CBC WITH DIFFERENTIAL/PLATELET
BASOS ABS: 59 {cells}/uL (ref 0–200)
Basophils Relative: 1.1 %
EOS ABS: 340 {cells}/uL (ref 15–500)
Eosinophils Relative: 6.3 %
HEMATOCRIT: 38.5 % (ref 35.0–45.0)
HEMOGLOBIN: 13 g/dL (ref 11.7–15.5)
LYMPHS ABS: 1258 {cells}/uL (ref 850–3900)
MCH: 30 pg (ref 27.0–33.0)
MCHC: 33.8 g/dL (ref 32.0–36.0)
MCV: 88.7 fL (ref 80.0–100.0)
MONOS PCT: 9.7 %
MPV: 10.5 fL (ref 7.5–12.5)
Neutro Abs: 3218 cells/uL (ref 1500–7800)
Neutrophils Relative %: 59.6 %
Platelets: 281 10*3/uL (ref 140–400)
RBC: 4.34 10*6/uL (ref 3.80–5.10)
RDW: 12.4 % (ref 11.0–15.0)
Total Lymphocyte: 23.3 %
WBC: 5.4 10*3/uL (ref 3.8–10.8)
WBCMIX: 524 {cells}/uL (ref 200–950)

## 2017-07-22 ENCOUNTER — Other Ambulatory Visit: Payer: Self-pay | Admitting: Rheumatology

## 2017-07-22 ENCOUNTER — Telehealth: Payer: Self-pay | Admitting: Rheumatology

## 2017-07-22 MED ORDER — LEFLUNOMIDE 20 MG PO TABS: 20.0000 mg | ORAL_TABLET | Freq: Every day | ORAL | 0 refills | Status: DC

## 2017-07-22 MED FILL — LEFLUNOMIDE 20 MG TABLET: 20 | 30 days supply | Qty: 30 | Fill #0

## 2017-07-22 NOTE — Telephone Encounter (Signed)
advised patient would be able to give a 30 day supply and unable to refill if she does not make a follow up appointment.   Last visit: 10/23/16 Next visit was due in January 2019. Patient states she will call back to schedule.  Labs: 07/18/17 Glucose 110. All other lab values are WNL  Okay to refill 30 day supply per Dr. Estanislado Pandy

## 2017-07-22 NOTE — Telephone Encounter (Signed)
Patient called stating [facility] told her that they were not able to refill her Leflunomide due to patient needing labwork done.  Patient states she had her bloodwork on 07/18/17.  Patient is requesting a call to [facility] to let them know it is okay to refill medication.

## 2017-09-11 DIAGNOSIS — J309 Allergic rhinitis, unspecified: Secondary | ICD-10-CM | POA: Diagnosis not present

## 2017-09-11 DIAGNOSIS — F411 Generalized anxiety disorder: Secondary | ICD-10-CM | POA: Diagnosis not present

## 2017-09-11 DIAGNOSIS — G4700 Insomnia, unspecified: Secondary | ICD-10-CM | POA: Diagnosis not present

## 2017-09-11 MED FILL — DYMISTA NASAL SPRAY: 137-50 | 30 days supply | Qty: 23 | Fill #0

## 2017-09-11 MED FILL — PARoxetine HCL 10 MG TABS: 10 | 33 days supply | Qty: 60 | Fill #0

## 2017-09-11 MED FILL — hydrOXYzine HCL 25 MG TABS: 25 | 30 days supply | Qty: 30 | Fill #0

## 2017-09-11 MED FILL — LEVOCETIRIZINE 5 MG TABLET: 5 | 30 days supply | Qty: 30 | Fill #0

## 2017-10-21 MED FILL — LISINOPRIL-HCTZ 10-12.5 TAB: 10-12.5 | 90 days supply | Qty: 90 | Fill #0

## 2017-11-19 MED FILL — LEVOCETIRIZINE 5 MG TABLET: 5 | 30 days supply | Qty: 30 | Fill #1

## 2017-11-21 DIAGNOSIS — F341 Dysthymic disorder: Secondary | ICD-10-CM | POA: Diagnosis not present

## 2017-11-21 DIAGNOSIS — F411 Generalized anxiety disorder: Secondary | ICD-10-CM | POA: Diagnosis not present

## 2017-11-21 DIAGNOSIS — I10 Essential (primary) hypertension: Secondary | ICD-10-CM | POA: Diagnosis not present

## 2017-11-21 DIAGNOSIS — J309 Allergic rhinitis, unspecified: Secondary | ICD-10-CM | POA: Diagnosis not present

## 2017-11-21 DIAGNOSIS — Z6825 Body mass index (BMI) 25.0-25.9, adult: Secondary | ICD-10-CM | POA: Diagnosis not present

## 2017-11-21 MED FILL — MONTELUKAST SOD 10 MG TAB: 10 | 90 days supply | Qty: 90 | Fill #1

## 2017-11-21 MED FILL — DYMISTA NASAL SPRAY: 137-50 | 30 days supply | Qty: 23 | Fill #0

## 2017-11-21 MED FILL — PARoxetine HCL 20 MG TABS: 20 | 90 days supply | Qty: 90 | Fill #0

## 2017-12-19 NOTE — Progress Notes (Signed)
Office Visit Note  Patient: Kara Campbell             Date of Birth: February 16, 1961           MRN: [id number]             PCP: Fanny Bien, MD Referring: Everlene Farrier, MD Visit Date: 12/20/2017 Occupation: @GUAROCC @  Subjective:  Right hand pain   History of Present Illness: Kara Campbell is a 57 y.o. female with history of seropositive rheumatoid arthritis and osteoarthritis.  Patient reports that she has been off of Keith for 1 month.  She reports she is having a flare in her right second MCP joint.  She states that she has tenderness and swelling.  She denies any increased stiffness.  She states that she has been under a tremendous amount of stress. She was recently started on Paxil, which is improving her anxiety. She states that she has been having difficulty getting labs and coming to office visits.  She states she has been taking Advil 200 mg 4 tablets by mouth daily.  She denies any other joint pain or joint swelling at this time.  Activities of Daily Living:  Patient reports morning stiffness for 0  minutes.   Patient Denies nocturnal pain.  Difficulty dressing/grooming: Denies Difficulty climbing stairs: Denies Difficulty getting out of chair: Denies Difficulty using hands for taps, buttons, cutlery, and/or writing: Reports  Review of Systems  Constitutional: Positive for fatigue.  HENT: Negative for mouth sores, mouth dryness and nose dryness.   Eyes: Negative for pain, visual disturbance and dryness.  Respiratory: Negative for cough, hemoptysis, shortness of breath and difficulty breathing.   Cardiovascular: Negative for chest pain, palpitations, hypertension and swelling in legs/feet.  Gastrointestinal: Negative for blood in stool, constipation and diarrhea.  Endocrine: Negative for increased urination.  Genitourinary: Negative for painful urination.  Musculoskeletal: Positive for arthralgias, joint pain and joint swelling. Negative for myalgias, muscle  weakness, morning stiffness, muscle tenderness and myalgias.  Skin: Negative for color change, pallor, rash, hair loss, nodules/bumps, skin tightness, ulcers and sensitivity to sunlight.  Allergic/Immunologic: Negative for susceptible to infections.  Neurological: Negative for dizziness, numbness, headaches and weakness.  Hematological: Negative for swollen glands.  Psychiatric/Behavioral: Negative for depressed mood and sleep disturbance. The patient is not nervous/anxious.     PMFS History:  Patient Active Problem List   Diagnosis Date Noted  . History of hypertension 10/16/2016  . Essential hypertension 09/11/2016  . Rheumatoid arthritis involving multiple sites with positive rheumatoid factor ([place]) 01/23/2016  . High risk medication use 01/23/2016  . Primary osteoarthritis of both hands 01/23/2016  . Primary osteoarthritis of both feet 01/23/2016  . Malignant melanoma ([place]) 01/23/2016  . Basal cell carcinoma 01/23/2016  . Anxiety 01/23/2016  . Chest congestion 06/07/2015    Past Medical History:  Diagnosis Date  . Arthritis   . Melanoma ([place]) rhe  . Rheumatoid arthritis(714.0)     Family History  Problem Relation Age of Onset  . Cancer Mother        breast  . Rheum arthritis Other    Past Surgical History:  Procedure Laterality Date  . INJECTION KNEE Right    Fluid removed from knee x2   . MELANOMA EXCISION     Social History   Social History Narrative  . Not on file    Objective: Vital Signs: BP 129/81 (BP Location: Left Arm, Patient Position: Sitting, Cuff Size: Normal)   Pulse 64  Resp 13   Ht 5\' 5"  (1.651 m)   Wt 149 lb (67.6 kg)   BMI 24.79 kg/m    Physical Exam  Constitutional: She is oriented to person, place, and time. She appears well-developed and well-nourished.  HENT:  Head: Normocephalic and atraumatic.  Eyes: Conjunctivae and EOM are normal.  Neck: Normal range of motion.  Cardiovascular: Normal rate, regular rhythm, normal heart sounds  and intact distal pulses.  Pulmonary/Chest: Effort normal and breath sounds normal.  Abdominal: Soft. Bowel sounds are normal.  Lymphadenopathy:    She has no cervical adenopathy.  Neurological: She is alert and oriented to person, place, and time.  Skin: Skin is warm and dry. Capillary refill takes less than 2 seconds.  Psychiatric: She has a normal mood and affect. Her behavior is normal.  Nursing note and vitals reviewed.    Musculoskeletal Exam: C-spine slightly limited range of motion with lateral rotation.  Thoracic and lumbar spine good range of motion.  No midline spinal tenderness.  No SI joint tenderness.  Shoulder joints, elbow joints, wrist joints, MCPs, PIPs, DIPs good range of motion.  She has PIP and DIP synovial thickening consistent with osteoarthritis of bilateral hands.  She has synovitis and tenderness of the right second MCP joint.  Hip joints, knee joints, ankle joints, MTPs, PIPs, DIPs good range of motion no synovitis.  No warmth or effusion of bilateral knee joints.  She has PIP and DIPs synovial thickening consistent with osteoarthritis of bilateral feet.  No Achilles tendinitis or plantar fasciitis.  No tenderness of trochanteric bursa bilaterally.  CDAI Exam: CDAI Score: 3.4  Patient Global Assessment: 8 (mm); Provider Global Assessment: 6 (mm) Swollen: 1 ; Tender: 1  Joint Exam      Right  Left  MCP 2  Swollen Tender        Investigation: No additional findings.  Imaging: No results found.  Recent Labs: Lab Results  Component Value Date   WBC 5.4 07/18/2017   HGB 13.0 07/18/2017   PLT 281 07/18/2017   NA 143 07/18/2017   K 4.3 07/18/2017   CL 106 07/18/2017   CO2 30 07/18/2017   GLUCOSE 110 (H) 07/18/2017   BUN 13 07/18/2017   CREATININE 0.68 07/18/2017   BILITOT 0.6 07/18/2017   ALKPHOS 59 10/23/2016   AST 20 07/18/2017   ALT 18 07/18/2017   PROT 6.5 07/18/2017   ALBUMIN 4.2 10/23/2016   CALCIUM 9.3 07/18/2017   GFRAA 113 07/18/2017     Speciality Comments: No specialty comments available.  Procedures:  No procedures performed Allergies: Amoxil [amoxicillin]   Assessment / Plan:     Visit Diagnoses: Rheumatoid arthritis involving multiple sites with positive rheumatoid factor ([place]) Erosive disease: She has synovitis of the right second MCP joint on exam.  She has been off of P[place] for 1 month.  She has been under a tremendous amount of stress at home and has been unable to make appointments and get lab work.  She has no other joint pain or joint swelling at this time.  A prescription for Arava 20 mg by mouth daily was sent to the pharmacy.  She was also given a prednisone taper starting at 20 mg tapering by 5 mg every 4 days.  She is advised to follow-up in the office in 5 months.  She will notify us if she develops increased joint pain or joint swelling.  She is advised also call the office when she would like to schedule an  ultrasound of bilateral hands.  High risk medication use - Arava 20 mg by mouth daily.  CBC and CMP are drawn today to monitor for drug toxicity.- Plan: CBC with Differential/Platelet, COMPLETE METABOLIC PANEL WITH GFR  Primary osteoarthritis of both hands: She is PIP and DIP synovial thickening consistent with osteoarthritis of bilateral hands.  She has no tenderness or synovitis of PIP or DIP joints.  Joint protection and muscle strengthening were discussed.  Primary osteoarthritis of both feet: She has PIP and DIP synovial thickening consistent with osteoarthritis of bilateral feet.  She has bilateral first MTP synovial thickening.  She notes no discomfort in her feet at this time.  She wears proper fitting shoes.  Anxiety: He was recently started on Paxil.  Other medical conditions are listed as follows:  History of hypertension  History of basal cell cancer  History of melanoma - 2002.   Orders: Orders Placed This Encounter  Procedures  . CBC with Differential/Platelet  . COMPLETE  METABOLIC PANEL WITH GFR   Meds ordered this encounter  Medications  . predniSONE (DELTASONE) 5 MG tablet    Sig: Take 4 tablets by mouth for 4 days, 3 tablets by mouth for 4 days, 2 tablets by mouth for 4 days, and 1 tablet by mouth for 4 days.    Dispense:  40 tablet    Refill:  0  . leflunomide (ARAVA) 20 MG tablet    Sig: Take 1 tablet (20 mg total) by mouth daily.    Dispense:  90 tablet    Refill:  0    Face-to-face time spent with patient was 30 minutes. Greater than 50% of time was spent in counseling and coordination of care.  Follow-Up Instructions: Return in about 5 months (around 05/21/2018) for Rheumatoid arthritis, Osteoarthritis.   Ofilia Neas, PA-C  Note - This record has been created using Dragon software.  Chart creation errors have been sought, but may not always  have been located. Such creation errors do not reflect on  the standard of medical care.

## 2017-12-20 ENCOUNTER — Ambulatory Visit: Payer: 59 | Admitting: Physician Assistant

## 2017-12-20 ENCOUNTER — Encounter: Payer: Self-pay | Admitting: Physician Assistant

## 2017-12-20 VITALS — BP 129/81 | HR 64 | Resp 13 | Ht 65.0 in | Wt 149.0 lb

## 2017-12-20 DIAGNOSIS — M19041 Primary osteoarthritis, right hand: Secondary | ICD-10-CM | POA: Diagnosis not present

## 2017-12-20 DIAGNOSIS — M19042 Primary osteoarthritis, left hand: Secondary | ICD-10-CM | POA: Diagnosis not present

## 2017-12-20 DIAGNOSIS — Z85828 Personal history of other malignant neoplasm of skin: Secondary | ICD-10-CM

## 2017-12-20 DIAGNOSIS — M0579 Rheumatoid arthritis with rheumatoid factor of multiple sites without organ or systems involvement: Secondary | ICD-10-CM

## 2017-12-20 DIAGNOSIS — F419 Anxiety disorder, unspecified: Secondary | ICD-10-CM | POA: Diagnosis not present

## 2017-12-20 DIAGNOSIS — Z79899 Other long term (current) drug therapy: Secondary | ICD-10-CM | POA: Diagnosis not present

## 2017-12-20 DIAGNOSIS — M19071 Primary osteoarthritis, right ankle and foot: Secondary | ICD-10-CM | POA: Diagnosis not present

## 2017-12-20 DIAGNOSIS — Z85820 Personal history of malignant melanoma of skin: Secondary | ICD-10-CM | POA: Diagnosis not present

## 2017-12-20 DIAGNOSIS — Z8679 Personal history of other diseases of the circulatory system: Secondary | ICD-10-CM | POA: Diagnosis not present

## 2017-12-20 DIAGNOSIS — M19072 Primary osteoarthritis, left ankle and foot: Secondary | ICD-10-CM

## 2017-12-20 LAB — COMPLETE METABOLIC PANEL WITH GFR
AG Ratio: 1.9 (calc) (ref 1.0–2.5)
ALBUMIN MSPROF: 4.3 g/dL (ref 3.6–5.1)
ALKALINE PHOSPHATASE (APISO): 53 U/L (ref 33–130)
ALT: 13 U/L (ref 6–29)
AST: 15 U/L (ref 10–35)
BUN: 16 mg/dL (ref 7–25)
CO2: 30 mmol/L (ref 20–32)
Calcium: 9.4 mg/dL (ref 8.6–10.4)
Chloride: 104 mmol/L (ref 98–110)
Creat: 0.78 mg/dL (ref 0.50–1.05)
GFR, EST AFRICAN AMERICAN: 98 mL/min/{1.73_m2} (ref >=60)
GFR, Est Non African American: 84 mL/min/{1.73_m2} (ref >=60)
GLOBULIN: 2.3 g/dL (ref 1.9–3.7)
GLUCOSE: 79 mg/dL (ref 65–99)
Potassium: 4.1 mmol/L (ref 3.5–5.3)
SODIUM: 140 mmol/L (ref 135–146)
TOTAL PROTEIN: 6.6 g/dL (ref 6.1–8.1)
Total Bilirubin: 0.5 mg/dL (ref 0.2–1.2)

## 2017-12-20 LAB — CBC WITH DIFFERENTIAL/PLATELET
BASOS ABS: 39 {cells}/uL (ref 0–200)
Basophils Relative: 0.7 %
EOS ABS: 270 {cells}/uL (ref 15–500)
Eosinophils Relative: 4.9 %
HCT: 38.1 % (ref 35.0–45.0)
Hemoglobin: 12.9 g/dL (ref 11.7–15.5)
Lymphs Abs: 1095 cells/uL (ref 850–3900)
MCH: 30.4 pg (ref 27.0–33.0)
MCHC: 33.9 g/dL (ref 32.0–36.0)
MCV: 89.9 fL (ref 80.0–100.0)
MONOS PCT: 10 %
MPV: 10 fL (ref 7.5–12.5)
Neutro Abs: 3548 cells/uL (ref 1500–7800)
Neutrophils Relative %: 64.5 %
PLATELETS: 325 10*3/uL (ref 140–400)
RBC: 4.24 10*6/uL (ref 3.80–5.10)
RDW: 12.2 % (ref 11.0–15.0)
TOTAL LYMPHOCYTE: 19.9 %
WBC mixed population: 550 cells/uL (ref 200–950)
WBC: 5.5 10*3/uL (ref 3.8–10.8)

## 2017-12-20 MED ORDER — LEFLUNOMIDE 20 MG PO TABS: 20.0000 mg | ORAL_TABLET | Freq: Every day | ORAL | 0 refills | Status: DC

## 2017-12-20 MED ORDER — PREDNISONE 5 MG PO TABS: ORAL_TABLET | ORAL | 0 refills | Status: DC

## 2017-12-20 MED FILL — predniSONE 5 MG TABS: 5 | 16 days supply | Qty: 40 | Fill #0

## 2017-12-20 MED FILL — LEFLUNOMIDE 20 MG TABLET: 20 | 90 days supply | Qty: 90 | Fill #0

## 2017-12-20 NOTE — Patient Instructions (Signed)
Standing Labs We placed an order today for your standing lab work.    Please come back and get your standing labs in January and every 3 months   We have open lab Monday through Friday from 8:30-11:30 AM and 1:30-4:00 PM  at the office of Dr. Shaili Deveshwar.   You may experience shorter wait times on Monday and Friday afternoons. The office is located at [address], [address], [place], [place] [address] No appointment is necessary.   Labs are drawn by [facility].  You may receive a bill from [facility] for your lab work. If you have any questions regarding directions or hours of operation,  please call [phone].   Just as a reminder please drink plenty of water prior to coming for your lab work. Thanks!   

## 2017-12-23 NOTE — Progress Notes (Signed)
Labs are WNL.

## 2018-01-02 DIAGNOSIS — F411 Generalized anxiety disorder: Secondary | ICD-10-CM | POA: Diagnosis not present

## 2018-01-02 DIAGNOSIS — F341 Dysthymic disorder: Secondary | ICD-10-CM | POA: Diagnosis not present

## 2018-01-02 DIAGNOSIS — G4700 Insomnia, unspecified: Secondary | ICD-10-CM | POA: Diagnosis not present

## 2018-01-02 MED FILL — hydrOXYzine HCL 25 MG TABS: 25 | 30 days supply | Qty: 30 | Fill #0

## 2018-01-08 MED FILL — LEVOCETIRIZINE 5 MG TABLET: 5 | 90 days supply | Qty: 90 | Fill #0

## 2018-01-14 ENCOUNTER — Telehealth: Payer: Self-pay | Admitting: Rheumatology

## 2018-01-14 NOTE — Telephone Encounter (Signed)
Patient states she is having pain in her neck at the base of her skull. Patient states she "feels like someone has hit her in the base of the skull with the baseball bat". Patient states it has been going on for 4 days. Patient states it is on both sides of her neck. Patient states she has been using Zanaflex, Ibuprofen and Tylenol. Patient states she has tried iced and heat with no relief. Patient has also tried salon pas with some relief. Patient states there is no headache associated with it. Patient denies any dizziness, no nausea, no other symptoms. Please advise.

## 2018-01-14 NOTE — Telephone Encounter (Signed)
Patient states she did not have any injury or exacerbating event. No numbness or tingling. Patient is not having muscle spasms or stiffness, but does state that the muscles at the base of her skull are tight. Patient has seen Dr. Ellene Route in the past. Patient will make a follow up appointment with Dr. Ellene Route to have her neck evaluated, continue the ibuprofen and the tylenol and try to schedule a massage this weekend.

## 2018-01-14 NOTE — Telephone Encounter (Signed)
Did she have any injury or exacerbating event? Any numbness or tingling? She is experiencing increased neck stiffness or muscle spasms? Has she experienced this type of pain in the past? It appears she had a MRI of the C-spine in 2018.  Does she see a spine specialist? Please schedule an appointment with the patient if her symptoms persist.

## 2018-01-14 NOTE — Telephone Encounter (Signed)
Patient called stating she has been having pain in the back of her head near the base of her skull for 4 days.  Patient states she has tried Xanaflex and also alternating Advil with Extra Strength Tylenol, but it isn't helping.  Patient states she has used the [employer] spray which has helped a little so she is not sure if it is muscle or arthritis pain.  Patient is requesting a return call to let her know if there is something else Dr. Estanislado Pandy would recommend.

## 2018-01-27 DIAGNOSIS — E559 Vitamin D deficiency, unspecified: Secondary | ICD-10-CM | POA: Diagnosis not present

## 2018-01-27 DIAGNOSIS — Z0000 Encounter for general adult medical examination without abnormal findings: Secondary | ICD-10-CM | POA: Diagnosis not present

## 2018-01-31 DIAGNOSIS — Z23 Encounter for immunization: Secondary | ICD-10-CM | POA: Diagnosis not present

## 2018-01-31 DIAGNOSIS — Z1211 Encounter for screening for malignant neoplasm of colon: Secondary | ICD-10-CM | POA: Diagnosis not present

## 2018-01-31 DIAGNOSIS — Z6825 Body mass index (BMI) 25.0-25.9, adult: Secondary | ICD-10-CM | POA: Diagnosis not present

## 2018-01-31 DIAGNOSIS — Z0000 Encounter for general adult medical examination without abnormal findings: Secondary | ICD-10-CM | POA: Diagnosis not present

## 2018-01-31 DIAGNOSIS — E559 Vitamin D deficiency, unspecified: Secondary | ICD-10-CM | POA: Diagnosis not present

## 2018-01-31 MED FILL — LISINOPRIL-HCTZ 10-12.5 TAB: 10-12.5 | 90 days supply | Qty: 90 | Fill #0

## 2018-02-25 MED FILL — PARoxetine HCL 20 MG TABS: 20 | 90 days supply | Qty: 90 | Fill #0

## 2018-02-25 MED FILL — MONTELUKAST SOD 10 MG TAB: 10 | 90 days supply | Qty: 90 | Fill #1

## 2018-03-25 ENCOUNTER — Other Ambulatory Visit: Payer: Self-pay | Admitting: Physician Assistant

## 2018-03-25 MED FILL — LEFLUNOMIDE 20 MG TABLET: 20 | 30 days supply | Qty: 30 | Fill #0

## 2018-03-25 NOTE — Telephone Encounter (Signed)
Last visit: 12/20/17 Next Visit: 05/22/18 Labs: 12/20/17 WNL  Patient advised she is due for labs. Patient will update 03/27/18.   Okay to refill 30 day supply per Dr. Estanislado Pandy

## 2018-03-27 ENCOUNTER — Other Ambulatory Visit: Payer: Self-pay

## 2018-03-27 DIAGNOSIS — Z79899 Other long term (current) drug therapy: Secondary | ICD-10-CM

## 2018-03-27 LAB — COMPLETE METABOLIC PANEL WITH GFR
AG RATIO: 1.8 (calc) (ref 1.0–2.5)
ALBUMIN MSPROF: 4.2 g/dL (ref 3.6–5.1)
ALKALINE PHOSPHATASE (APISO): 59 U/L (ref 33–130)
ALT: 22 U/L (ref 6–29)
AST: 23 U/L (ref 10–35)
BUN: 18 mg/dL (ref 7–25)
CALCIUM: 9.7 mg/dL (ref 8.6–10.4)
CO2: 27 mmol/L (ref 20–32)
Chloride: 105 mmol/L (ref 98–110)
Creat: 0.77 mg/dL (ref 0.50–1.05)
GFR, EST NON AFRICAN AMERICAN: 86 mL/min/{1.73_m2} (ref >=60)
GFR, Est African American: 99 mL/min/{1.73_m2} (ref >=60)
GLOBULIN: 2.4 g/dL (ref 1.9–3.7)
Glucose, Bld: 80 mg/dL (ref 65–99)
POTASSIUM: 4.2 mmol/L (ref 3.5–5.3)
SODIUM: 140 mmol/L (ref 135–146)
Total Bilirubin: 0.5 mg/dL (ref 0.2–1.2)
Total Protein: 6.6 g/dL (ref 6.1–8.1)

## 2018-03-27 LAB — CBC WITH DIFFERENTIAL/PLATELET
Absolute Monocytes: 735 cells/uL (ref 200–950)
BASOS ABS: 59 {cells}/uL (ref 0–200)
Basophils Relative: 0.9 %
EOS ABS: 351 {cells}/uL (ref 15–500)
Eosinophils Relative: 5.4 %
HCT: 38.3 % (ref 35.0–45.0)
HEMOGLOBIN: 12.7 g/dL (ref 11.7–15.5)
Lymphs Abs: 1391 cells/uL (ref 850–3900)
MCH: 29.9 pg (ref 27.0–33.0)
MCHC: 33.2 g/dL (ref 32.0–36.0)
MCV: 90.1 fL (ref 80.0–100.0)
MONOS PCT: 11.3 %
MPV: 10.3 fL (ref 7.5–12.5)
NEUTROS ABS: 3965 {cells}/uL (ref 1500–7800)
NEUTROS PCT: 61 %
Platelets: 297 10*3/uL (ref 140–400)
RBC: 4.25 10*6/uL (ref 3.80–5.10)
RDW: 12.4 % (ref 11.0–15.0)
TOTAL LYMPHOCYTE: 21.4 %
WBC: 6.5 10*3/uL (ref 3.8–10.8)

## 2018-04-22 DIAGNOSIS — E559 Vitamin D deficiency, unspecified: Secondary | ICD-10-CM | POA: Diagnosis not present

## 2018-04-24 DIAGNOSIS — Z6826 Body mass index (BMI) 26.0-26.9, adult: Secondary | ICD-10-CM | POA: Diagnosis not present

## 2018-04-24 DIAGNOSIS — E559 Vitamin D deficiency, unspecified: Secondary | ICD-10-CM | POA: Diagnosis not present

## 2018-04-28 ENCOUNTER — Other Ambulatory Visit: Payer: Self-pay | Admitting: Rheumatology

## 2018-04-28 MED FILL — LEFLUNOMIDE 20 MG TABLET: 20 | 90 days supply | Qty: 90 | Fill #0

## 2018-04-28 NOTE — Telephone Encounter (Signed)
Last visit: 12/20/17 Next Visit: 05/22/18 Labs: 03/27/18 WNL  Okay to refill per Dr. Estanislado Pandy

## 2018-05-07 MED FILL — LISINOPRIL-HCTZ 10-12.5 MG: 10-12.5 | 90 days supply | Qty: 90 | Fill #1

## 2018-05-08 NOTE — Progress Notes (Signed)
Office Visit Note  Patient: Kara Campbell             Date of Birth: 1960-08-08           MRN: [id number]             PCP: Fanny Bien, MD Referring: Fanny Bien, MD Visit Date: 05/22/2018 Occupation: @GUAROCC @  Subjective:  Right 2nd MCP joint pain   History of Present Illness: Rhett A Jessee is a 58 y.o. female with history of seropositive rheumatoid arthritis and osteoarthritis.  Patient is taking Arava 20 mg by mouth daily.  She has not missed any doses recently.  She denies any recent infections.  She states she continues to have pain and swelling in the right second MCP joint.  She reports that 10 days ago she developed pain and swelling in the left knee joint.  She states that she was seen at [facility] urgent care and x-ray of the left knee was obtained.  She was given a left knee cortisone injection as well as a prednisone taper which she has completed.  She reports that she has occasional neck stiffness and she takes Zanaflex, which resolves her symptoms.    Activities of Daily Living:  Patient reports morning stiffness for 0 minutes.   Patient Denies nocturnal pain.  Difficulty dressing/grooming: Denies Difficulty climbing stairs: Denies Difficulty getting out of chair: Denies Difficulty using hands for taps, buttons, cutlery, and/or writing: Denies  Review of Systems  Constitutional: Negative for fatigue.  HENT: Negative for mouth sores, mouth dryness and nose dryness.   Eyes: Negative for pain, visual disturbance and dryness.  Respiratory: Negative for cough, hemoptysis, shortness of breath and difficulty breathing.   Cardiovascular: Negative for chest pain, palpitations, hypertension and swelling in legs/feet.  Gastrointestinal: Negative for blood in stool, constipation and diarrhea.  Endocrine: Negative for increased urination.  Genitourinary: Negative for painful urination.  Musculoskeletal: Positive for arthralgias, joint pain and joint swelling.  Negative for myalgias, muscle weakness, morning stiffness, muscle tenderness and myalgias.  Skin: Negative for color change, pallor, rash, hair loss, nodules/bumps, skin tightness, ulcers and sensitivity to sunlight.  Allergic/Immunologic: Negative for susceptible to infections.  Neurological: Negative for dizziness, numbness, headaches and weakness.  Hematological: Negative for swollen glands.  Psychiatric/Behavioral: Negative for depressed mood and sleep disturbance. The patient is not nervous/anxious.     PMFS History:  Patient Active Problem List   Diagnosis Date Noted  . History of hypertension 10/16/2016  . Essential hypertension 09/11/2016  . Rheumatoid arthritis involving multiple sites with positive rheumatoid factor ([place]) 01/23/2016  . High risk medication use 01/23/2016  . Primary osteoarthritis of both hands 01/23/2016  . Primary osteoarthritis of both feet 01/23/2016  . Malignant melanoma ([place]) 01/23/2016  . Basal cell carcinoma 01/23/2016  . Anxiety 01/23/2016  . Chest congestion 06/07/2015    Past Medical History:  Diagnosis Date  . Arthritis   . Melanoma ([place]) rhe  . Rheumatoid arthritis(714.0)     Family History  Problem Relation Age of Onset  . Cancer Mother        breast  . Rheum arthritis Other    Past Surgical History:  Procedure Laterality Date  . INJECTION KNEE Right    Fluid removed from knee x2   . MELANOMA EXCISION     Social History   Social History Narrative  . Not on file    There is no immunization history on file for this patient.   Objective:  Vital Signs: There were no vitals taken for this visit.   Physical Exam Vitals signs and nursing note reviewed.  Constitutional:      Appearance: She is well-developed.  HENT:     Head: Normocephalic and atraumatic.  Eyes:     Conjunctiva/sclera: Conjunctivae normal.  Neck:     Musculoskeletal: Normal range of motion.  Cardiovascular:     Rate and Rhythm: Normal rate and regular  rhythm.     Heart sounds: Normal heart sounds.  Pulmonary:     Effort: Pulmonary effort is normal.     Breath sounds: Normal breath sounds.  Abdominal:     General: Bowel sounds are normal.     Palpations: Abdomen is soft.  Lymphadenopathy:     Cervical: No cervical adenopathy.  Skin:    General: Skin is warm and dry.     Capillary Refill: Capillary refill takes less than 2 seconds.  Neurological:     Mental Status: She is alert and oriented to person, place, and time.  Psychiatric:        Behavior: Behavior normal.      Musculoskeletal Exam: C-spine, thoracic spine, lumbar spine good range of motion.  No midline spinal tenderness.  No SI joint tenderness.  Shoulder joints, elbow joints, wrist joints, PIPs, DIPs good ROM with no synovitis.  Right 2nd MCP joint synovitis.  Hip joints, knee joints, ankle joints, MTPs, PIPs, DIPs good range of motion no synovitis.  No warmth or effusion of bilateral knee joints.  No tenderness or swelling of ankle joints.  She has PIP and DIP synovial thickening consistent with osteoarthritis of bilateral feet.  No tenderness of MTP joints noted.  No tenderness over trochanteric bursa bilaterally.   CDAI Exam: CDAI Score: Not documented Patient Global Assessment: Not documented; Provider Global Assessment: Not documented Swollen: 1 ; Tender: 1  Joint Exam      Right  Left  MCP 2  Swollen Tender      There is currently no information documented on the homunculus. Go to the Rheumatology activity and complete the homunculus joint exam.  Investigation: No additional findings.  Imaging: No results found.  Recent Labs: Lab Results  Component Value Date   WBC 6.5 03/27/2018   HGB 12.7 03/27/2018   PLT 297 03/27/2018   NA 140 03/27/2018   K 4.2 03/27/2018   CL 105 03/27/2018   CO2 27 03/27/2018   GLUCOSE 80 03/27/2018   BUN 18 03/27/2018   CREATININE 0.77 03/27/2018   BILITOT 0.5 03/27/2018   ALKPHOS 59 10/23/2016   AST 23 03/27/2018   ALT  22 03/27/2018   PROT 6.6 03/27/2018   ALBUMIN 4.2 10/23/2016   CALCIUM 9.7 03/27/2018   GFRAA 99 03/27/2018    Speciality Comments: No specialty comments available.  Procedures:  No procedures performed Allergies: Amoxil [amoxicillin]   Assessment / Plan:     Visit Diagnoses: Rheumatoid arthritis involving multiple sites with positive rheumatoid factor ([facility]) Erosive disease  -She has active synovitis and tenderness of the right second MCP joint.  10 days ago she developed pain and swelling of the left knee joint and was evaluated at [facility] urgent care.  She had a left knee cortisone injection performed and was started on a prednisone taper which she has completed.  No warmth or effusion of the left knee joint was noted on exam today.  She has been taking Arava 20 mg 1 tablet by mouth daily.  She has not missed any  doses recently.  She has not had any recent infections.  We discussed adding on Plaquenil as combination therapy.  Indications, contraindications, potential side effects were discussed.  She was previously on Plaquenil and tolerated it well.  Consent was obtained today.  She was started on Plaquenil 200 mg 1 tablet twice daily Monday through Friday.  She will return for lab work in 1 month and then every 3 months.  She will continue taking Arava 20 mg 1 tablet by mouth daily.  She was advised to notify us if develops increased joint pain or joint swelling.  She will follow-up in the office in 3 months.  Plan: hydroxychloroquine (PLAQUENIL) 200 MG tablet  Patient was counseled on the purpose, proper use, and adverse effects of hydroxychloroquine including nausea/diarrhea, skin rash, headaches, and sun sensitivity.  Discussed importance of annual eye exams while on hydroxychloroquine to monitor to ocular toxicity and discussed importance of frequent laboratory monitoring.  Provided patient with eye exam form for baseline ophthalmologic exam.  Provided patient with educational materials  on hydroxychloroquine and answered all questions.  Patient consented to hydroxychloroquine.  Will upload consent in the media tab.    Dose will be Plaquenil 200 mg twice daily Monday through Friday.  Prescription pending lab results.  High risk medication use - Arava 20 mg by mouth daily.  Adding on Plaquenil 200 mg BID M-F. Most recent CBC/CMP within normal limits on 03/27/2018. She will return for lab work in April and every 3 months. She was given a PLQ eye exam form today.    Primary osteoarthritis of both hands: She has PIP and DIP synovial thickening consistent with osteoarthritis.  She has complete fist formation bilaterally.  Joint protection and muscle strengthening were discussed  Primary osteoarthritis of both feet: She has PIP and DIP synovial thickening consistent with osteoarthritis of both feet.  She has no tenderness of MTPs.  The importance of wearing proper fitting shoes was discussed.   Other medical conditions are listed as follows:  History of hypertension  History of basal cell cancer  Anxiety  History of melanoma  History of squamous cell carcinoma   Orders: No orders of the defined types were placed in this encounter.  Meds ordered this encounter  Medications  . hydroxychloroquine (PLAQUENIL) 200 MG tablet    Sig: Take 1 tablet 200 mg BID Monday-Friday    Dispense:  40 tablet    Refill:  0    Face-to-face time spent with patient was 30 minutes. Greater than 50% of time was spent in counseling and coordination of care.  Follow-Up Instructions: Return in about 3 months (around 08/22/2018) for Rheumatoid arthritis, Osteoarthritis.   Ofilia Neas, PA-C  Note - This record has been created using Dragon software.  Chart creation errors have been sought, but may not always  have been located. Such creation errors do not reflect on  the standard of medical care.

## 2018-05-10 DIAGNOSIS — M25562 Pain in left knee: Secondary | ICD-10-CM | POA: Diagnosis not present

## 2018-05-10 DIAGNOSIS — M545 Low back pain: Secondary | ICD-10-CM | POA: Diagnosis not present

## 2018-05-10 DIAGNOSIS — M25552 Pain in left hip: Secondary | ICD-10-CM | POA: Diagnosis not present

## 2018-05-21 MED FILL — MONTELUKAST SOD 10 MG TAB: 10 | 90 days supply | Qty: 90 | Fill #2

## 2018-05-21 MED FILL — PARoxetine HCL 20 MG TABS: 20 | 90 days supply | Qty: 90 | Fill #1

## 2018-05-21 MED FILL — LEVOCETIRIZINE 5 MG TABLET: 5 | 90 days supply | Qty: 90 | Fill #1

## 2018-05-22 ENCOUNTER — Ambulatory Visit: Payer: 59 | Admitting: Physician Assistant

## 2018-05-22 ENCOUNTER — Other Ambulatory Visit: Payer: Self-pay

## 2018-05-22 ENCOUNTER — Encounter: Payer: Self-pay | Admitting: Physician Assistant

## 2018-05-22 VITALS — BP 141/94 | HR 62 | Resp 12 | Ht 64.0 in | Wt 157.6 lb

## 2018-05-22 DIAGNOSIS — Z79899 Other long term (current) drug therapy: Secondary | ICD-10-CM

## 2018-05-22 DIAGNOSIS — F419 Anxiety disorder, unspecified: Secondary | ICD-10-CM

## 2018-05-22 DIAGNOSIS — Z8679 Personal history of other diseases of the circulatory system: Secondary | ICD-10-CM | POA: Diagnosis not present

## 2018-05-22 DIAGNOSIS — M19041 Primary osteoarthritis, right hand: Secondary | ICD-10-CM | POA: Diagnosis not present

## 2018-05-22 DIAGNOSIS — Z85820 Personal history of malignant melanoma of skin: Secondary | ICD-10-CM

## 2018-05-22 DIAGNOSIS — Z85828 Personal history of other malignant neoplasm of skin: Secondary | ICD-10-CM | POA: Diagnosis not present

## 2018-05-22 DIAGNOSIS — M19042 Primary osteoarthritis, left hand: Secondary | ICD-10-CM

## 2018-05-22 DIAGNOSIS — M19071 Primary osteoarthritis, right ankle and foot: Secondary | ICD-10-CM | POA: Diagnosis not present

## 2018-05-22 DIAGNOSIS — M0579 Rheumatoid arthritis with rheumatoid factor of multiple sites without organ or systems involvement: Secondary | ICD-10-CM

## 2018-05-22 DIAGNOSIS — Z8589 Personal history of malignant neoplasm of other organs and systems: Secondary | ICD-10-CM

## 2018-05-22 DIAGNOSIS — M19072 Primary osteoarthritis, left ankle and foot: Secondary | ICD-10-CM

## 2018-05-22 MED ORDER — HYDROXYCHLOROQUINE SULFATE 200 MG PO TABS: ORAL_TABLET | ORAL | 0 refills | Status: DC

## 2018-05-22 MED FILL — HYDROXYCHLOROQUINE SULFATE: 200 | 28 days supply | Qty: 40 | Fill #0

## 2018-05-22 NOTE — Progress Notes (Signed)
Pharmacy Note  Subjective: Patient presents today to the [facility] to see Dr. Estanislado Pandy.  Patient seen by the pharmacist for counseling on hydroxychloroquine rheumatoid arthritis. She is currently on Arava 20 mg daily.  She has been on Plaquenil in the past and tolerated well.  Objective: CMP     Component Value Date/Time   NA 140 03/27/2018 1054   K 4.2 03/27/2018 1054   CL 105 03/27/2018 1054   CO2 27 03/27/2018 1054   GLUCOSE 80 03/27/2018 1054   BUN 18 03/27/2018 1054   CREATININE 0.77 03/27/2018 1054   CALCIUM 9.7 03/27/2018 1054   PROT 6.6 03/27/2018 1054   ALBUMIN 4.2 10/23/2016 0905   AST 23 03/27/2018 1054   ALT 22 03/27/2018 1054   ALKPHOS 59 10/23/2016 0905   BILITOT 0.5 03/27/2018 1054   GFRNONAA 86 03/27/2018 1054   GFRAA 99 03/27/2018 1054    CBC    Component Value Date/Time   WBC 6.5 03/27/2018 1054   RBC 4.25 03/27/2018 1054   HGB 12.7 03/27/2018 1054   HCT 38.3 03/27/2018 1054   PLT 297 03/27/2018 1054   MCV 90.1 03/27/2018 1054   MCH 29.9 03/27/2018 1054   MCHC 33.2 03/27/2018 1054   RDW 12.4 03/27/2018 1054   LYMPHSABS 1,391 03/27/2018 1054   MONOABS 392 10/23/2016 0905   EOSABS 351 03/27/2018 1054   BASOSABS 59 03/27/2018 1054    Assessment/Plan: Patient was counseled on the purpose, proper use, and adverse effects of hydroxychloroquine including nausea/diarrhea, skin rash, headaches, and sun sensitivity.  Discussed importance of annual eye exams while on hydroxychloroquine to monitor to ocular toxicity and discussed importance of frequent laboratory monitoring.  Provided patient with eye exam form for baseline ophthalmologic exam and standing lab instructions.  Provided patient with educational materials on hydroxychloroquine and answered all questions.  Patient consented to hydroxychloroquine.  Will upload consent in the media tab.   Reviewed recommended vaccine which include annual influenza, Pneumovax 23, Prevnar 13, and  Shingrix as indicated. States she received her yearly flu shot and recently had one pneumonia vaccine.  She has an upcoming appointment with her PCP and will verify vaccine record then.   Dose will be Plaquenil 200 mg twice daily Monday through Friday.  Prescription sent to [facility] per patient request.  All questions encouraged and answered.  Instructed patient to call with any further questions or concerns.  Mariella Saa, PharmD, [facility] Clinical Pharmacist  05/22/2018 10:14 AM

## 2018-05-22 NOTE — Patient Instructions (Signed)
Standing Labs We placed an order today for your standing lab work.    Please come back and get your standing labs in 1 month, 3 months, and then every 5 months.  We have open lab Monday through Friday from 8:30-11:30 AM and 1:30-4:00 PM  at the office of Dr. Bo Merino.   You may experience shorter wait times on Monday and Friday afternoons. The office is located at [address] [place], [place], [place] [address] No appointment is necessary.   Labs are drawn by [facility].  You may receive a bill from [facility] for your lab work.  If you wish to have your labs drawn at another location, please call the office 24 hours in advance to send orders.  If you have any questions regarding directions or hours of operation,  please call [phone].   Just as a reminder please drink plenty of water prior to coming for your lab work. Thanks!  Vaccines You are taking a medication(s) that can suppress your immune system.  The following immunizations are recommended: . Flu annually . Pneumonia (Pneumovax 23 and Prevnar 13 spaced at least 1 year apart) . Shingrix  Please check with your PCP to make sure you are up to date.  Hydroxychloroquine tablets What is this medicine? HYDROXYCHLOROQUINE (hye drox ee KLOR oh kwin) is used to treat rheumatoid arthritis and systemic lupus erythematosus. It is also used to treat malaria. This medicine may be used for other purposes; ask your health care provider or pharmacist if you have questions. COMMON BRAND NAME(S): Plaquenil, Quineprox What should I tell my health care provider before I take this medicine? They need to know if you have any of these conditions: -diabetes -eye disease, vision problems -G6PD deficiency -history of blood diseases -history of irregular heartbeat -if you often drink alcohol -kidney disease -liver disease -porphyria -psoriasis -seizures -an unusual or allergic reaction to chloroquine, hydroxychloroquine, other  medicines, foods, dyes, or preservatives -pregnant or trying to get pregnant -breast-feeding How should I use this medicine? Take this medicine by mouth with a glass of water. Follow the directions on the prescription label. Avoid taking antacids within 4 hours of taking this medicine. It is best to separate these medicines by at least 4 hours. Do not cut, crush or chew this medicine. You can take it with or without food. If it upsets your stomach, take it with food. Take your medicine at regular intervals. Do not take your medicine more often than directed. Take all of your medicine as directed even if you think you are better. Do not skip doses or stop your medicine early. Talk to your pediatrician regarding the use of this medicine in children. While this drug may be prescribed for selected conditions, precautions do apply. Overdosage: If you think you have taken too much of this medicine contact a poison control center or emergency room at once. NOTE: This medicine is only for you. Do not share this medicine with others. What if I miss a dose? If you miss a dose, take it as soon as you can. If it is almost time for your next dose, take only that dose. Do not take double or extra doses. What may interact with this medicine? Do not take this medicine with any of the following medications: -cisapride -dofetilide -dronedarone -live virus vaccines -penicillamine -pimozide -thioridazine -ziprasidone This medicine may also interact with the following medications: -ampicillin -antacids -cimetidine -cyclosporine -digoxin -medicines for diabetes, like insulin, glipizide, glyburide -medicines for seizures like carbamazepine,  phenobarbital, phenytoin -mefloquine -methotrexate -other medicines that prolong the QT interval (cause an abnormal heart rhythm) -praziquantel This list may not describe all possible interactions. Give your health care provider a list of all the medicines, herbs,  non-prescription drugs, or dietary supplements you use. Also tell them if you smoke, drink alcohol, or use illegal drugs. Some items may interact with your medicine. What should I watch for while using this medicine? Tell your doctor or healthcare professional if your symptoms do not start to get better or if they get worse. Avoid taking antacids within 4 hours of taking this medicine. It is best to separate these medicines by at least 4 hours. Tell your doctor or health care professional right away if you have any change in your eyesight. Your vision and blood may be tested before and during use of this medicine. This medicine can make you more sensitive to the sun. Keep out of the sun. If you cannot avoid being in the sun, wear protective clothing and use sunscreen. Do not use sun lamps or tanning beds/booths. What side effects may I notice from receiving this medicine? Side effects that you should report to your doctor or health care professional as soon as possible: -allergic reactions like skin rash, itching or hives, swelling of the face, lips, or tongue -changes in vision -decreased hearing or ringing of the ears -redness, blistering, peeling or loosening of the skin, including inside the mouth -seizures -sensitivity to light -signs and symptoms of a dangerous change in heartbeat or heart rhythm like chest pain; dizziness; fast or irregular heartbeat; palpitations; feeling faint or lightheaded, falls; breathing problems -signs and symptoms of liver injury like dark yellow or brown urine; general ill feeling or flu-like symptoms; light-colored stools; loss of appetite; nausea; right upper belly pain; unusually weak or tired; yellowing of the eyes or skin -signs and symptoms of low blood sugar such as feeling anxious; confusion; dizziness; increased hunger; unusually weak or tired; sweating; shakiness; cold; irritable; headache; blurred vision; fast heartbeat; loss of  consciousness -uncontrollable head, mouth, neck, arm, or leg movements Side effects that usually do not require medical attention (report to your doctor or health care professional if they continue or are bothersome): -anxious -diarrhea -dizziness -hair loss -headache -irritable -loss of appetite -nausea, vomiting -stomach pain This list may not describe all possible side effects. Call your doctor for medical advice about side effects. You may report side effects to FDA at 1-800-FDA-1088. Where should I keep my medicine? Keep out of the reach of children. In children, this medicine can cause overdose with small doses. Store at room temperature between 15 and 30 degrees C (59 and 86 degrees F). Protect from moisture and light. Throw away any unused medicine after the expiration date. NOTE: This sheet is a summary. It may not cover all possible information. If you have questions about this medicine, talk to your doctor, pharmacist, or health care provider.  2019 Elsevier/Gold Standard (2015-10-12 14:16:15)

## 2018-05-30 ENCOUNTER — Other Ambulatory Visit: Payer: Self-pay

## 2018-05-30 DIAGNOSIS — M0579 Rheumatoid arthritis with rheumatoid factor of multiple sites without organ or systems involvement: Secondary | ICD-10-CM

## 2018-05-30 MED ORDER — HYDROXYCHLOROQUINE SULFATE 200 MG PO TABS: ORAL_TABLET | ORAL | 0 refills | Status: DC

## 2018-05-30 MED ORDER — LEFLUNOMIDE 20 MG PO TABS: 20.0000 mg | ORAL_TABLET | Freq: Every day | ORAL | 0 refills | Status: DC

## 2018-05-30 NOTE — Progress Notes (Signed)
Okay to refill PLQ per Dr. Estanislado Pandy. Patient has been notified.   Patient also requested refill of arava.   Last Visit: 05/22/2018 Next Visit: 08/26/2018 Labs: 03/27/2018 WNL  Okay to refill per Dr. Estanislado Pandy.

## 2018-06-03 MED FILL — HYDROXYCHLOROQUINE 200 MG: 200 | 84 days supply | Qty: 120 | Fill #0

## 2018-06-03 MED FILL — LEFLUNOMIDE 20 MG TABS: 20 | 90 days supply | Qty: 90 | Fill #0

## 2018-06-16 DIAGNOSIS — M25552 Pain in left hip: Secondary | ICD-10-CM | POA: Diagnosis not present

## 2018-06-19 ENCOUNTER — Other Ambulatory Visit: Payer: Self-pay | Admitting: *Deleted

## 2018-06-19 MED ORDER — TIZANIDINE HCL 4 MG PO TABS: 4.0000 mg | ORAL_TABLET | Freq: Every day | ORAL | 0 refills | Status: DC

## 2018-06-19 NOTE — Telephone Encounter (Signed)
Ok to refill 

## 2018-06-19 NOTE — Telephone Encounter (Signed)
Refill request received via fax  Last Visit: 05/22/18 Next Visit: 08/26/18  We last refilled medication in 2018.  Okay to refill Tizanidine?

## 2018-07-05 DIAGNOSIS — H16101 Unspecified superficial keratitis, right eye: Secondary | ICD-10-CM | POA: Diagnosis not present

## 2018-07-05 DIAGNOSIS — T1501XA Foreign body in cornea, right eye, initial encounter: Secondary | ICD-10-CM | POA: Diagnosis not present

## 2018-07-24 DIAGNOSIS — C44311 Basal cell carcinoma of skin of nose: Secondary | ICD-10-CM | POA: Diagnosis not present

## 2018-07-24 DIAGNOSIS — Z85820 Personal history of malignant melanoma of skin: Secondary | ICD-10-CM | POA: Diagnosis not present

## 2018-07-24 DIAGNOSIS — D2239 Melanocytic nevi of other parts of face: Secondary | ICD-10-CM | POA: Diagnosis not present

## 2018-07-24 DIAGNOSIS — Z85828 Personal history of other malignant neoplasm of skin: Secondary | ICD-10-CM | POA: Diagnosis not present

## 2018-07-24 DIAGNOSIS — L570 Actinic keratosis: Secondary | ICD-10-CM | POA: Diagnosis not present

## 2018-07-24 DIAGNOSIS — D485 Neoplasm of uncertain behavior of skin: Secondary | ICD-10-CM | POA: Diagnosis not present

## 2018-07-29 DIAGNOSIS — M069 Rheumatoid arthritis, unspecified: Secondary | ICD-10-CM | POA: Diagnosis not present

## 2018-07-29 DIAGNOSIS — I10 Essential (primary) hypertension: Secondary | ICD-10-CM | POA: Diagnosis not present

## 2018-07-29 DIAGNOSIS — F341 Dysthymic disorder: Secondary | ICD-10-CM | POA: Diagnosis not present

## 2018-07-29 DIAGNOSIS — G4700 Insomnia, unspecified: Secondary | ICD-10-CM | POA: Diagnosis not present

## 2018-07-29 DIAGNOSIS — F411 Generalized anxiety disorder: Secondary | ICD-10-CM | POA: Diagnosis not present

## 2018-07-30 DIAGNOSIS — Z23 Encounter for immunization: Secondary | ICD-10-CM | POA: Diagnosis not present

## 2018-08-05 DIAGNOSIS — C44519 Basal cell carcinoma of skin of other part of trunk: Secondary | ICD-10-CM | POA: Diagnosis not present

## 2018-08-05 DIAGNOSIS — Z85820 Personal history of malignant melanoma of skin: Secondary | ICD-10-CM | POA: Diagnosis not present

## 2018-08-05 DIAGNOSIS — L570 Actinic keratosis: Secondary | ICD-10-CM | POA: Diagnosis not present

## 2018-08-05 DIAGNOSIS — Z85828 Personal history of other malignant neoplasm of skin: Secondary | ICD-10-CM | POA: Diagnosis not present

## 2018-08-05 DIAGNOSIS — D2261 Melanocytic nevi of right upper limb, including shoulder: Secondary | ICD-10-CM | POA: Diagnosis not present

## 2018-08-05 DIAGNOSIS — D2271 Melanocytic nevi of right lower limb, including hip: Secondary | ICD-10-CM | POA: Diagnosis not present

## 2018-08-05 DIAGNOSIS — D2272 Melanocytic nevi of left lower limb, including hip: Secondary | ICD-10-CM | POA: Diagnosis not present

## 2018-08-05 DIAGNOSIS — D485 Neoplasm of uncertain behavior of skin: Secondary | ICD-10-CM | POA: Diagnosis not present

## 2018-08-05 DIAGNOSIS — D225 Melanocytic nevi of trunk: Secondary | ICD-10-CM | POA: Diagnosis not present

## 2018-08-08 MED FILL — LISINOPRIL-HCTZ 10-12.5 MG: 10-12.5 | 30 days supply | Qty: 30 | Fill #2

## 2018-08-11 DIAGNOSIS — C44311 Basal cell carcinoma of skin of nose: Secondary | ICD-10-CM | POA: Diagnosis not present

## 2018-08-11 DIAGNOSIS — Z85828 Personal history of other malignant neoplasm of skin: Secondary | ICD-10-CM | POA: Diagnosis not present

## 2018-08-11 DIAGNOSIS — Z85820 Personal history of malignant melanoma of skin: Secondary | ICD-10-CM | POA: Diagnosis not present

## 2018-08-11 MED FILL — DOXYCYCLINE HYC 100 MG CAPS: 100 | 5 days supply | Qty: 10 | Fill #0

## 2018-08-12 NOTE — Progress Notes (Signed)
Office Visit Note  Patient: Kara Campbell             Date of Birth: [birth year]           MRN: [id number]             PCP: Fanny Bien, MD Referring: Fanny Bien, MD Visit Date: 08/26/2018 Occupation: @GUAROCC @  Subjective:  Right shoulder joint pain   History of Present Illness: Kara Campbell is a 58 y.o. female with history of seropositive rheumatoid arthritis and osteoarthritis.  She is taking Arava 20 mg po daily and Plaquenil 200 mg 1 tablet by mouth twice daily M-F.  She started on PLQ in March 2020.  She has been tolerating PLQ without any side effects.  She has not noticed much improvement since starting on PLQ.  She reports she has been having worsening right shoulder joint pain for the past several weeks, especially at night. She states she has good ROM.  She denies any injuries or falls.  She states the pain is worse when wearing a bra.  She continues to have pain and swelling in the right 2nd MCP joint.  She denies any recurrence of knee joint pain or swelling. She denies any other joint pain or joint swelling.   Activities of Daily Living:  Patient reports morning stiffness for 0 none.   Patient Reports nocturnal pain.  Difficulty dressing/grooming: Denies Difficulty climbing stairs: Denies Difficulty getting out of chair: Denies Difficulty using hands for taps, buttons, cutlery, and/or writing: Denies  Review of Systems  Constitutional: Negative for fatigue.  HENT: Negative for mouth sores, mouth dryness and nose dryness.   Eyes: Negative for pain, visual disturbance and dryness.  Respiratory: Negative for cough, hemoptysis, shortness of breath and difficulty breathing.   Cardiovascular: Negative for chest pain, palpitations, hypertension and swelling in legs/feet.  Gastrointestinal: Negative for blood in stool, constipation and diarrhea.  Endocrine: Negative for increased urination.  Genitourinary: Negative for painful urination.  Musculoskeletal:  Positive for arthralgias, joint pain and joint swelling. Negative for myalgias, muscle weakness, morning stiffness, muscle tenderness and myalgias.  Skin: Negative for color change, pallor, rash, hair loss, nodules/bumps, skin tightness, ulcers and sensitivity to sunlight.  Allergic/Immunologic: Negative for susceptible to infections.  Neurological: Negative for dizziness, numbness, headaches and weakness.  Hematological: Negative for bruising/bleeding tendency and swollen glands.  Psychiatric/Behavioral: Negative for depressed mood and sleep disturbance. The patient is not nervous/anxious.     PMFS History:  Patient Active Problem List   Diagnosis Date Noted  . History of hypertension 10/16/2016  . Essential hypertension 09/11/2016  . Rheumatoid arthritis involving multiple sites with positive rheumatoid factor ([place]) 01/23/2016  . High risk medication use 01/23/2016  . Primary osteoarthritis of both hands 01/23/2016  . Primary osteoarthritis of both feet 01/23/2016  . Malignant melanoma ([place]) 01/23/2016  . Basal cell carcinoma 01/23/2016  . Anxiety 01/23/2016  . Chest congestion 06/07/2015    Past Medical History:  Diagnosis Date  . Arthritis   . Melanoma ([place]) rhe  . Rheumatoid arthritis(714.0)     Family History  Problem Relation Age of Onset  . Cancer Mother        breast  . Rheum arthritis Other    Past Surgical History:  Procedure Laterality Date  . INJECTION KNEE Right    Fluid removed from knee x2   . MELANOMA EXCISION     Social History   Social History Narrative  . Not on file  There is no immunization history on file for this patient.   Objective: Vital Signs: BP 130/78 (BP Location: Left Arm, Patient Position: Sitting, Cuff Size: Normal)   Pulse 66   Resp 16   Ht 5\' 5"  (1.651 m)   Wt 160 lb 9.6 oz (72.8 kg)   BMI 26.73 kg/m    Physical Exam Vitals signs and nursing note reviewed.  Constitutional:      Appearance: She is well-developed.  HENT:      Head: Normocephalic and atraumatic.  Eyes:     Conjunctiva/sclera: Conjunctivae normal.  Neck:     Musculoskeletal: Normal range of motion.  Cardiovascular:     Rate and Rhythm: Normal rate and regular rhythm.     Heart sounds: Normal heart sounds.  Pulmonary:     Effort: Pulmonary effort is normal.     Breath sounds: Normal breath sounds.  Abdominal:     General: Bowel sounds are normal.     Palpations: Abdomen is soft.  Lymphadenopathy:     Cervical: No cervical adenopathy.  Skin:    General: Skin is warm and dry.     Capillary Refill: Capillary refill takes less than 2 seconds.  Neurological:     Mental Status: She is alert and oriented to person, place, and time.  Psychiatric:        Behavior: Behavior normal.      Musculoskeletal Exam: C-spine, thoracic spine, lumbar spine good range of motion.  No midline spinal tenderness.  No SI joint tenderness.  Shoulder joints, elbow joints, wrist joints, MCPs, PIPs, DIPs good range of motion. Tenderness over the right AC joint. Right 2nd MCP joint tenderness and synovitis noted. She has complete fist formation bilaterally.  Hip joints, knee joints, ankle joints, MTPs, PIPs, DIPs good range of motion no synovitis.  No warmth or effusion of bilateral knee joints.  No tenderness or swelling of ankle joints.  No tenderness over trochanter bursa bilaterally.  CDAI Exam: CDAI Score: 2.2  Patient Global: 1 mm; Provider Global: 1 mm Swollen: 1 ; Tender: 2  Joint Exam      Right  Left  Acromioclavicular   Tender     MCP 2  Swollen Tender        Investigation: No additional findings.  Imaging: Xr Shoulder Right  Result Date: 08/26/2018 No glenohumeral joint space narrowing was noted.  No chondrocalcinosis was noted.  Spurring was noted at the acromioclavicular joint. Impression: No glenohumeral joint arthritis was noted.  Acromioclavicular spurring was noted.   Recent Labs: Lab Results  Component Value Date   WBC 6.5  03/27/2018   HGB 12.7 03/27/2018   PLT 297 03/27/2018   NA 140 03/27/2018   K 4.2 03/27/2018   CL 105 03/27/2018   CO2 27 03/27/2018   GLUCOSE 80 03/27/2018   BUN 18 03/27/2018   CREATININE 0.77 03/27/2018   BILITOT 0.5 03/27/2018   ALKPHOS 59 10/23/2016   AST 23 03/27/2018   ALT 22 03/27/2018   PROT 6.6 03/27/2018   ALBUMIN 4.2 10/23/2016   CALCIUM 9.7 03/27/2018   GFRAA 99 03/27/2018    Speciality Comments: No specialty comments available.  Procedures:  No procedures performed Allergies: Amoxil [amoxicillin]   Assessment / Plan:     Visit Diagnoses: Rheumatoid arthritis involving multiple sites with positive rheumatoid factor (HCC) Erosive disease  - She has right 2nd MCP joint synovitis and tenderness.  She has been experiencing right shoulder pain over the past several weeks.  She  has tenderness over the right AC joint.  She has good ROM with no discomfort. X-rays of the right shoulder were obtained, which revealed spurring of the [facility] joint but no glenohumeral joint space narrowing. She declined a right shoulder cortisone injection today. She is taking Arava 20 mg 1 tablet po daily and was started on Plaquenil 200 mg 1 tablet BID M-F in March 2020.  She has not noticed much improvement since starting on PLQ.  She has not had any recurrence of left knee joint pain or effusion. In the past, the second MCP joint has not had a response to prednisone.  We will try an ultrasound guided cortisone injection, and if she continues to have active synovitis we will add on sulfasalazine to her current treatment regimen.  She was given a handout of information about SSZ.    She will continue on this current treatment regimen.  She does not need any refills at this time.  She will follow up in 3 months.   High risk medication use - PLQ 200 mg BID M-F and Arava 20 mg 1 tablet by mouth daily. She is going to schedule a PLQ eye exam.  She was given a PLQ eye exam form to take with her to her  appointment.  We will be checking CBC and CMP today.  She will return in September and every 3 months for lab work.- Plan: CBC with Differential/Platelet, COMPLETE METABOLIC PANEL WITH GFR  Primary osteoarthritis of both hands -She has PIP and DIP synovial thickening consistent with osteoarthritis of bilateral hands.  She has complete fist formation bilaterally.  Joint protection and muscle strengthening were discussed.   Primary osteoarthritis of both feet - Hammertoes noted.  She has no tenderness or synovitis.  She has no discomfort in her feet at this time.  She wears proper fitting shoes.   Chronic right shoulder pain - She presents today with right shoulder joint pain that started 2-3 weeks ago.  She has not had any recent injuries or falls.  She has good ROM with no discomfort.  Her discomfort is exacerbated by laying on her side at night.  She has no warmth or effusion.  She has tenderness over the right AC joint  X-rays of the right shoulder were obtained today.  Spurring at the [facility] joint noted.  No glenohumeral joint space narrowing.  She was given a handout of shoulder joint exercises. Plan: XR Shoulder Right  Other medical conditions are listed as follows:   History of basal cell cancer   History of melanoma   History of squamous cell carcinoma   History of hypertension   Anxiety    Orders: Orders Placed This Encounter  Procedures  . XR Shoulder Right  . CBC with Differential/Platelet  . COMPLETE METABOLIC PANEL WITH GFR  . HIV Antibody (routine testing w rflx)  . QuantiFERON-TB Gold Plus  . Glucose 6 phosphate dehydrogenase   No orders of the defined types were placed in this encounter.   Face-to-face time spent with patient was 30 minutes. Greater than 50% of time was spent in counseling and coordination of care.  Follow-Up Instructions: Return in 5 months (on 01/26/2019) for Rheumatoid arthritis, Osteoarthritis.   Ofilia Neas, PA-C  Note - This record has  been created using Dragon software.  Chart creation errors have been sought, but may not always  have been located. Such creation errors do not reflect on  the standard of medical care.

## 2018-08-26 ENCOUNTER — Encounter: Payer: Self-pay | Admitting: Physician Assistant

## 2018-08-26 ENCOUNTER — Other Ambulatory Visit: Payer: Self-pay

## 2018-08-26 ENCOUNTER — Ambulatory Visit (INDEPENDENT_AMBULATORY_CARE_PROVIDER_SITE_OTHER): Payer: 59

## 2018-08-26 ENCOUNTER — Ambulatory Visit: Payer: 59 | Admitting: Physician Assistant

## 2018-08-26 VITALS — BP 130/78 | HR 66 | Resp 16 | Ht 65.0 in | Wt 160.6 lb

## 2018-08-26 DIAGNOSIS — F419 Anxiety disorder, unspecified: Secondary | ICD-10-CM | POA: Diagnosis not present

## 2018-08-26 DIAGNOSIS — G8929 Other chronic pain: Secondary | ICD-10-CM | POA: Diagnosis not present

## 2018-08-26 DIAGNOSIS — Z85828 Personal history of other malignant neoplasm of skin: Secondary | ICD-10-CM | POA: Diagnosis not present

## 2018-08-26 DIAGNOSIS — Z8679 Personal history of other diseases of the circulatory system: Secondary | ICD-10-CM | POA: Diagnosis not present

## 2018-08-26 DIAGNOSIS — M19041 Primary osteoarthritis, right hand: Secondary | ICD-10-CM

## 2018-08-26 DIAGNOSIS — M25511 Pain in right shoulder: Secondary | ICD-10-CM

## 2018-08-26 DIAGNOSIS — Z8589 Personal history of malignant neoplasm of other organs and systems: Secondary | ICD-10-CM | POA: Diagnosis not present

## 2018-08-26 DIAGNOSIS — M19071 Primary osteoarthritis, right ankle and foot: Secondary | ICD-10-CM | POA: Diagnosis not present

## 2018-08-26 DIAGNOSIS — M0579 Rheumatoid arthritis with rheumatoid factor of multiple sites without organ or systems involvement: Secondary | ICD-10-CM

## 2018-08-26 DIAGNOSIS — Z85820 Personal history of malignant melanoma of skin: Secondary | ICD-10-CM

## 2018-08-26 DIAGNOSIS — M19072 Primary osteoarthritis, left ankle and foot: Secondary | ICD-10-CM

## 2018-08-26 DIAGNOSIS — Z79899 Other long term (current) drug therapy: Secondary | ICD-10-CM

## 2018-08-26 DIAGNOSIS — M19042 Primary osteoarthritis, left hand: Secondary | ICD-10-CM

## 2018-08-26 MED FILL — PARoxetine HCL 20 MG TABS: 20 | 90 days supply | Qty: 90 | Fill #2

## 2018-08-26 MED FILL — MONTELUKAST SOD 10 MG TAB: 10 | 90 days supply | Qty: 90 | Fill #0

## 2018-08-26 NOTE — Patient Instructions (Signed)
Shoulder Range of Motion Exercises  Shoulder range of motion (ROM) exercises are done to keep the shoulder moving freely or to increase movement. They are often recommended for people who have shoulder pain or stiffness or who are recovering from a shoulder surgery.  Phase 1 exercises  When you are able, do this exercise 1-2 times per day for 30-60 seconds in each direction, or as directed by your health care provider.  Pendulum exercise  To do this exercise while sitting:  1. Sit in a chair or at the edge of your bed with your feet flat on the floor.  2. Let your affected arm hang down in front of you over the edge of the bed or chair.  3. Relax your shoulder, arm, and hand.  4. Rock your body so your arm gently swings in small circles. You can also use your unaffected arm to start the motion.  5. Repeat changing the direction of the circles, swinging your arm left and right, and swinging your arm forward and back.  To do this exercise while standing:  1. Stand next to a sturdy chair or table, and hold on to it with your hand on your unaffected side.  2. Bend forward at the waist.  3. Bend your knees slightly.  4. Relax your shoulder, arm, and hand.  5. While keeping your shoulder relaxed, use body motion to swing your arm in small circles.  6. Repeat changing the direction of the circles, swinging your arm left and right, and swinging your arm forward and back.  7. Between exercises, stand up tall and take a short break to relax your lower back.    Phase 2 exercises  Do these exercises 1-2 times per day or as told by your health care provider. Hold each stretch for 30 seconds, and repeat 3 times. Do the exercises with one or both arms as instructed by your health care provider.  For these exercises, sit at a table with your hand and arm supported by the table. A chair that slides easily or has wheels can be helpful.  External rotation  1. Turn your chair so that your affected side is nearest to the  table.  2. Place your forearm on the table to your side. Bend your elbow about 90 at the elbow (right angle) and place your hand palm facing down on the table. Your elbow should be about 6 inches away from your side.  3. Keeping your arm on the table, lean your body forward.  Abduction  1. Turn your chair so that your affected side is nearest to the table.  2. Place your forearm and hand on the table so that your thumb points toward the ceiling and your arm is straight out to your side.  3. Slide your hand out to the side and away from you, using your unaffected arm to do the work.  4. To increase the stretch, you can slide your chair away from the table.  Flexion: forward stretch  1. Sit facing the table. Place your hand and elbow on the table in front of you.  2. Slide your hand forward and away from you, using your unaffected arm to do the work.  3. To increase the stretch, you can slide your chair backward.  Phase 3 exercises  Do these exercises 1-2 times per day or as told by your health care provider. Hold each stretch for 30 seconds, and repeat 3 times. Do the exercises with one or   both arms as instructed by your health care provider.  Cross-body stretch: posterior capsule stretch  1. Lift your arm straight out in front of you.  2. Bend your arm 90 at the elbow (right angle) so your forearm moves across your body.  3. Use your other arm to gently pull the elbow across your body, toward your other shoulder.  Wall climbs  1. Stand with your affected arm extended out to the side with your hand resting on a door frame.  2. Slide your hand slowly up the door frame.  3. To increase the stretch, step through the door frame. Keep your body upright and do not lean.  Wand exercises  You will need a cane, a piece of PVC pipe, or a sturdy wooden dowel for wand exercises.  Flexion  To do this exercise while standing:  1. Hold the wand with both of your hands, palms down.  2. Using the other arm to help, lift your arms  up and over your head, if able.  3. Push upward with your other arm to gently increase the stretch.  To do this exercise while lying down:  1. Lie on your back with your elbows resting on the floor and the wand in both your hands. Your hands will be palm down, or pointing toward your feet.  2. Lift your hands toward the ceiling, using your unaffected arm to help if needed.  3. Bring your arms overhead as able, using your unaffected arm to help if needed.  Internal rotation  1. Stand while holding the wand behind you with both hands. Your unaffected arm should be extended above your head with the arm of the affected side extended behind you at the level of your waist. The wand should be pointing straight up and down as you hold it.  2. Slowly pull the wand up behind your back by straightening the elbow of your unaffected arm and bending the elbow of your affected arm.  External rotation  1. Lie on your back with your affected upper arm supported on a small pillow or rolled towel. When you first do this exercise, keep your upper arm close to your body. Over time, bring your arm up to a 90 angle out to the side.  2. Hold the wand across your stomach and with both hands palm up. Your elbow on your affected side should be bent at a 90 angle.  3. Use your unaffected side to help push your forearm away from you and toward the floor. Keep your elbow on your affected side bent at a 90 angle.  Contact a health care provider if you have:   New or increasing pain.   New numbness, tingling, weakness, or discoloration in your arm or hand.  This information is not intended to replace advice given to you by your health care provider. Make sure you discuss any questions you have with your health care provider.  Document Released: 11/25/2002 Document Revised: 04/10/2017 Document Reviewed: 04/10/2017  Elsevier Interactive Patient Education  2019 [employer]

## 2018-08-26 NOTE — Progress Notes (Signed)
Pharmacy Note  Subjective:  Patient presents today to the [facility] to see Dr. Estanislado Pandy.  Patient seen by pharmacist for counseling on sulfasalazine for rheumatoid arthritis. She has history of melanoma, basal cell carcinoma so biologics are not appropriate option.  She is on Arava 20 mg and Plaquenil 200 mg 1 tablet twice daily Monday through Friday only started in March 2020 with inadequate response.  Prior therapy includes:MTX.  Objective: CMP     Component Value Date/Time   NA 140 03/27/2018 1054   K 4.2 03/27/2018 1054   CL 105 03/27/2018 1054   CO2 27 03/27/2018 1054   GLUCOSE 80 03/27/2018 1054   BUN 18 03/27/2018 1054   CREATININE 0.77 03/27/2018 1054   CALCIUM 9.7 03/27/2018 1054   PROT 6.6 03/27/2018 1054   ALBUMIN 4.2 10/23/2016 0905   AST 23 03/27/2018 1054   ALT 22 03/27/2018 1054   ALKPHOS 59 10/23/2016 0905   BILITOT 0.5 03/27/2018 1054   GFRNONAA 86 03/27/2018 1054   GFRAA 99 03/27/2018 1054    CBC    Component Value Date/Time   WBC 6.5 03/27/2018 1054   RBC 4.25 03/27/2018 1054   HGB 12.7 03/27/2018 1054   HCT 38.3 03/27/2018 1054   PLT 297 03/27/2018 1054   MCV 90.1 03/27/2018 1054   MCH 29.9 03/27/2018 1054   MCHC 33.2 03/27/2018 1054   RDW 12.4 03/27/2018 1054   LYMPHSABS 1,391 03/27/2018 1054   MONOABS 392 10/23/2016 0905   EOSABS 351 03/27/2018 1054   BASOSABS 59 03/27/2018 1054     Baseline Immunosuppressant Therapy Labs TB GOLD: pending 08/26/2018  Hepatitis Panel: negative 06/08/2009  HIV: pending 08/26/2018  Immunoglobulins Immunoglobulin Electrophoresis Latest Ref Rng & Units 01/25/2016  IgG 694 - 1,618 mg/dL 1,085  IgM 48 - 271 mg/dL 160   SPEP Serum Protein Electrophoresis Latest Ref Rng & Units 03/27/2018  Total Protein 6.1 - 8.1 g/dL 6.6  Albumin 3.8 - 4.8 g/dL -  Alpha-1 0.2 - 0.3 g/dL -  Alpha-2 0.5 - 0.9 g/dL -  Beta Globulin 0.4 - 0.6 g/dL -  Beta 2 0.2 - 0.5 g/dL -  Gamma Globulin 0.8 - 1.7 g/dL -   Interpretation - -   G6PD:pending 08/26/2018  TPMT No results found for: TPMT   Chest x-ray: normal 06/09/2009  Does the patient have an allergy to sulfa drugs? No  Assessment/Plan:  After further discussion with Dr. Estanislado Pandy patient preferred to delay initiation of sulfasalazine.  Patient wanted to try steroid injection and will revisit starting sulfasalazine if inadequate response. Discussed starting at a lower dose of 500 mg twice daily.  Mariella Saa, PharmD, [place], CPP Rheumatology Clinical Pharmacist  08/26/2018 10:40 AM

## 2018-08-28 LAB — COMPLETE METABOLIC PANEL WITH GFR
AG Ratio: 2 (calc) (ref 1.0–2.5)
ALT: 14 U/L (ref 6–29)
AST: 20 U/L (ref 10–35)
Albumin: 4.2 g/dL (ref 3.6–5.1)
Alkaline phosphatase (APISO): 46 U/L (ref 37–153)
BUN: 15 mg/dL (ref 7–25)
CO2: 29 mmol/L (ref 20–32)
Calcium: 9.5 mg/dL (ref 8.6–10.4)
Chloride: 105 mmol/L (ref 98–110)
Creat: 0.7 mg/dL (ref 0.50–1.05)
GFR, Est African American: 111 mL/min/{1.73_m2} (ref >=60)
GFR, Est Non African American: 96 mL/min/{1.73_m2} (ref >=60)
Globulin: 2.1 g/dL (calc) (ref 1.9–3.7)
Glucose, Bld: 84 mg/dL (ref 65–99)
Potassium: 4 mmol/L (ref 3.5–5.3)
Sodium: 142 mmol/L (ref 135–146)
Total Bilirubin: 0.4 mg/dL (ref 0.2–1.2)
Total Protein: 6.3 g/dL (ref 6.1–8.1)

## 2018-08-28 LAB — GLUCOSE 6 PHOSPHATE DEHYDROGENASE: G-6PDH: 11.6 U/g Hgb (ref 7.0–20.5)

## 2018-08-28 LAB — CBC WITH DIFFERENTIAL/PLATELET
Absolute Monocytes: 476 cells/uL (ref 200–950)
Basophils Absolute: 72 cells/uL (ref 0–200)
Basophils Relative: 1.8 %
Eosinophils Absolute: 224 cells/uL (ref 15–500)
Eosinophils Relative: 5.6 %
HCT: 37.4 % (ref 35.0–45.0)
Hemoglobin: 12.4 g/dL (ref 11.7–15.5)
Lymphs Abs: 1012 cells/uL (ref 850–3900)
MCH: 30.5 pg (ref 27.0–33.0)
MCHC: 33.2 g/dL (ref 32.0–36.0)
MCV: 92.1 fL (ref 80.0–100.0)
MPV: 10.4 fL (ref 7.5–12.5)
Monocytes Relative: 11.9 %
Neutro Abs: 2216 cells/uL (ref 1500–7800)
Neutrophils Relative %: 55.4 %
Platelets: 267 10*3/uL (ref 140–400)
RBC: 4.06 10*6/uL (ref 3.80–5.10)
RDW: 12 % (ref 11.0–15.0)
Total Lymphocyte: 25.3 %
WBC: 4 10*3/uL (ref 3.8–10.8)

## 2018-08-28 LAB — QUANTIFERON-TB GOLD PLUS
Mitogen-NIL: 7.54 IU/mL
NIL: 0.01 IU/mL
QuantiFERON-TB Gold Plus: NEGATIVE
TB1-NIL: 0 IU/mL
TB2-NIL: 0 IU/mL

## 2018-08-28 LAB — HIV ANTIBODY (ROUTINE TESTING W REFLEX): HIV 1&2 Ab, 4th Generation: NONREACTIVE

## 2018-08-28 NOTE — Progress Notes (Signed)
All the labs are within normal limits.

## 2018-09-15 MED FILL — LISINOPRIL-HCTZ 10-12.5 MG: 10-12.5 | 30 days supply | Qty: 30 | Fill #3

## 2018-10-01 ENCOUNTER — Other Ambulatory Visit: Payer: Self-pay | Admitting: Rheumatology

## 2018-10-01 DIAGNOSIS — M0579 Rheumatoid arthritis with rheumatoid factor of multiple sites without organ or systems involvement: Secondary | ICD-10-CM

## 2018-10-01 MED FILL — HYDROXYCHLOROQUINE 200 MG T: 200 | 60 days supply | Qty: 120 | Fill #0

## 2018-10-01 MED FILL — LEVOCETIRIZINE 5 MG TABLET: 5 | 90 days supply | Qty: 90 | Fill #2

## 2018-10-01 NOTE — Telephone Encounter (Signed)
Patient scheduled for 10/07/18 at 10:45 am. Patient advised to continue PLQ and Arava at this time. Patient will get the field of vision done this weekend.

## 2018-10-01 NOTE — Telephone Encounter (Addendum)
Last Visit: 08/26/18 Next visit due November 2020. Message sent to schedule the patient  Labs: 08/26/18 WNL Patient states she has not scheduled an appointment for the PLQ.   Patient states she has been having swelling in her right knuckle on the first finger. Patient states she has not any improvement on PLQ. Patient would like to know if she should stop the PLQ and try the Ultrasound guided injection and continue with just [place] and see if that works. Please advise.

## 2018-10-01 NOTE — Telephone Encounter (Signed)
Ok to schedule patient for ultrasound guided cortisone injection for right 2nd MCP joint inflammation.   Advise patient to continue on PLQ and Arava as prescribed at this time.   Ok to refill PLQ

## 2018-10-07 ENCOUNTER — Other Ambulatory Visit: Payer: Self-pay

## 2018-10-07 ENCOUNTER — Ambulatory Visit: Payer: 59 | Admitting: Rheumatology

## 2018-10-07 DIAGNOSIS — M79641 Pain in right hand: Secondary | ICD-10-CM | POA: Diagnosis not present

## 2018-10-07 DIAGNOSIS — M0579 Rheumatoid arthritis with rheumatoid factor of multiple sites without organ or systems involvement: Secondary | ICD-10-CM | POA: Diagnosis not present

## 2018-10-07 MED ORDER — LIDOCAINE HCL 1 % IJ SOLN
0.5000 mL | INTRAMUSCULAR | Status: AC | PRN
  Administered 2018-10-07: .5 mL

## 2018-10-07 MED ORDER — TRIAMCINOLONE ACETONIDE 40 MG/ML IJ SUSP
10.0000 mg | INTRAMUSCULAR | Status: AC | PRN
  Administered 2018-10-07: 10 mg via INTRA_ARTICULAR

## 2018-10-07 NOTE — Progress Notes (Signed)
   Procedure Note  Patient: Kara Campbell             Date of Birth: 07-28-1960           MRN: [id number]             Visit Date: 10/07/2018  Procedures: Visit Diagnoses:  1. Rheumatoid arthritis involving multiple sites with positive rheumatoid factor (HCC) Erosive disease    2. Pain in right hand     Small Joint Inj: R index MCP on 10/07/2018 11:43 AM Indications: pain and joint swelling Details: 27 G needle, ultrasound-guided dorsal approach  Spinal Needle: No  Medications: 0.5 mL lidocaine 1 %; 10 mg triamcinolone acetonide 40 MG/ML Aspirate: 0 mL Immediately prior to procedure a time out was called to verify the correct patient, procedure, equipment, support staff and site/side marked as required. Patient was prepped and draped in the usual sterile fashion.    Postprocedure instructions were given.  A finger splint was applied to be used for 3 days.  If she has persistent symptoms she is supposed to notify us.  Bo Merino, MD

## 2018-10-20 MED FILL — LISINOPRIL-HCTZ 10-12.5 MG: 10-12.5 | 30 days supply | Qty: 30 | Fill #4

## 2018-11-06 ENCOUNTER — Other Ambulatory Visit: Payer: Self-pay | Admitting: Rheumatology

## 2018-11-06 MED FILL — LEFLUNOMIDE 20 MG TABLET: 20 | 90 days supply | Qty: 90 | Fill #0

## 2018-11-06 NOTE — Telephone Encounter (Signed)
Please schedule patient for a follow up appointment. Patient due November . Thanks!

## 2018-11-06 NOTE — Telephone Encounter (Signed)
LMOM for patient to call and schedule follow-up appointment.   °

## 2018-11-06 NOTE — Telephone Encounter (Signed)
Last Visit: 08/26/18 Next Visit: due November 2020. Message sent to the front to schedule patient.  Labs: 08/26/18 WNL  Okay to refill per Dr. Estanislado Pandy

## 2018-11-11 DIAGNOSIS — N952 Postmenopausal atrophic vaginitis: Secondary | ICD-10-CM | POA: Diagnosis not present

## 2018-11-11 DIAGNOSIS — Z01419 Encounter for gynecological examination (general) (routine) without abnormal findings: Secondary | ICD-10-CM | POA: Diagnosis not present

## 2018-11-11 DIAGNOSIS — N644 Mastodynia: Secondary | ICD-10-CM | POA: Diagnosis not present

## 2018-11-11 DIAGNOSIS — Z6826 Body mass index (BMI) 26.0-26.9, adult: Secondary | ICD-10-CM | POA: Diagnosis not present

## 2018-11-12 ENCOUNTER — Other Ambulatory Visit: Payer: Self-pay | Admitting: Obstetrics and Gynecology

## 2018-11-12 DIAGNOSIS — N644 Mastodynia: Secondary | ICD-10-CM

## 2018-11-19 MED FILL — LISINOPRIL-HCTZ 10-12.5 MG: 10-12.5 | 30 days supply | Qty: 30 | Fill #5

## 2018-11-20 ENCOUNTER — Ambulatory Visit
Admission: RE | Admit: 2018-11-20 | Discharge: 2018-11-20 | Disposition: A | Discharge status: Home / Self Care | Payer: 59 | Source: Ambulatory Visit | Attending: Obstetrics and Gynecology | Admitting: Obstetrics and Gynecology

## 2018-11-20 ENCOUNTER — Ambulatory Visit: Payer: 59

## 2018-11-20 ENCOUNTER — Other Ambulatory Visit: Payer: Self-pay

## 2018-11-20 DIAGNOSIS — R928 Other abnormal and inconclusive findings on diagnostic imaging of breast: Secondary | ICD-10-CM | POA: Diagnosis not present

## 2018-11-20 DIAGNOSIS — N644 Mastodynia: Secondary | ICD-10-CM

## 2018-12-03 MED FILL — PARoxetine HCL 20 MG TABS: 20 | 90 days supply | Qty: 90 | Fill #3

## 2018-12-03 MED FILL — MONTELUKAST SOD 10 MG TAB: 10 | 90 days supply | Qty: 90 | Fill #1

## 2018-12-26 MED FILL — LISINOPRIL-HCTZ 10-12.5 MG: 10-12.5 | 90 days supply | Qty: 90 | Fill #0

## 2019-01-19 ENCOUNTER — Other Ambulatory Visit: Payer: Self-pay | Admitting: Rheumatology

## 2019-01-19 DIAGNOSIS — M0579 Rheumatoid arthritis with rheumatoid factor of multiple sites without organ or systems involvement: Secondary | ICD-10-CM

## 2019-01-19 MED FILL — HYDROXYCHLOROQUINE 200 MG T: 200 | 28 days supply | Qty: 40 | Fill #0

## 2019-01-19 NOTE — Progress Notes (Deleted)
Office Visit Note  Patient: Kara Campbell             Date of Birth: 09-Aug-1960           MRN: [id number]             PCP: Fanny Bien, MD Referring: Fanny Bien, MD Visit Date: 01/27/2019 Occupation: @GUAROCC @  Subjective:  No chief complaint on file.   History of Present Illness: Kara Campbell is a 58 y.o. female ***   Activities of Daily Living:  Patient reports morning stiffness for *** {minute/hour:19697}.   Patient {ACTIONS;DENIES/REPORTS:21021675::"Denies"} nocturnal pain.  Difficulty dressing/grooming: {ACTIONS;DENIES/REPORTS:21021675::"Denies"} Difficulty climbing stairs: {ACTIONS;DENIES/REPORTS:21021675::"Denies"} Difficulty getting out of chair: {ACTIONS;DENIES/REPORTS:21021675::"Denies"} Difficulty using hands for taps, buttons, cutlery, and/or writing: {ACTIONS;DENIES/REPORTS:21021675::"Denies"}  No Rheumatology ROS completed.   PMFS History:  Patient Active Problem List   Diagnosis Date Noted  . History of hypertension 10/16/2016  . Essential hypertension 09/11/2016  . Rheumatoid arthritis involving multiple sites with positive rheumatoid factor ([place]) 01/23/2016  . High risk medication use 01/23/2016  . Primary osteoarthritis of both hands 01/23/2016  . Primary osteoarthritis of both feet 01/23/2016  . Malignant melanoma ([place]) 01/23/2016  . Basal cell carcinoma 01/23/2016  . Anxiety 01/23/2016  . Chest congestion 06/07/2015    Past Medical History:  Diagnosis Date  . Arthritis   . Melanoma ([place]) rhe  . Rheumatoid arthritis(714.0)     Family History  Problem Relation Age of Onset  . Cancer Mother        breast  . Breast cancer Mother 95  . Rheum arthritis Other    Past Surgical History:  Procedure Laterality Date  . INJECTION KNEE Right    Fluid removed from knee x2   . MELANOMA EXCISION     Social History   Social History Narrative  . Not on file    There is no immunization history on file for this patient.    Objective: Vital Signs: There were no vitals taken for this visit.   Physical Exam   Musculoskeletal Exam: ***  CDAI Exam: CDAI Score: - Patient Global: -; Provider Global: - Swollen: -; Tender: - Joint Exam   No joint exam has been documented for this visit   There is currently no information documented on the homunculus. Go to the Rheumatology activity and complete the homunculus joint exam.  Investigation: No additional findings.  Imaging: No results found.  Recent Labs: Lab Results  Component Value Date   WBC 4.0 08/26/2018   HGB 12.4 08/26/2018   PLT 267 08/26/2018   NA 142 08/26/2018   K 4.0 08/26/2018   CL 105 08/26/2018   CO2 29 08/26/2018   GLUCOSE 84 08/26/2018   BUN 15 08/26/2018   CREATININE 0.70 08/26/2018   BILITOT 0.4 08/26/2018   ALKPHOS 59 10/23/2016   AST 20 08/26/2018   ALT 14 08/26/2018   PROT 6.3 08/26/2018   ALBUMIN 4.2 10/23/2016   CALCIUM 9.5 08/26/2018   GFRAA 111 08/26/2018   QFTBGOLDPLUS NEGATIVE 08/26/2018    Speciality Comments: No specialty comments available.  Procedures:  No procedures performed Allergies: Amoxil [amoxicillin]   Assessment / Plan:     Visit Diagnoses: Rheumatoid arthritis involving multiple sites with positive rheumatoid factor (HCC) Erosive disease   High risk medication use - 200 mg BID M-F and Arava 20 mg 1 tablet by mouth daily  Primary osteoarthritis of both hands  Primary osteoarthritis of both feet  History of basal cell cancer  History of melanoma  History of squamous cell carcinoma  History of hypertension  Anxiety  Orders: No orders of the defined types were placed in this encounter.  No orders of the defined types were placed in this encounter.   Face-to-face time spent with patient was *** minutes. Greater than 50% of time was spent in counseling and coordination of care.  Follow-Up Instructions: No follow-ups on file.   Ofilia Neas, PA-C  Note - This record has been  created using Dragon software.  Chart creation errors have been sought, but may not always  have been located. Such creation errors do not reflect on  the standard of medical care.

## 2019-01-19 NOTE — Telephone Encounter (Addendum)
Last Visit: 08/26/18 Next Visit: 01/27/19 Labs: 08/26/18 WNL No PLQ eye exam on file  Left message to advise patient we need PLQ eye exam  Okay to refill 30 day supply PLQ?

## 2019-01-19 NOTE — Telephone Encounter (Signed)
Ok to refill 30 day supply of Plaquenil

## 2019-01-27 ENCOUNTER — Ambulatory Visit: Payer: 59 | Admitting: Physician Assistant

## 2019-02-19 ENCOUNTER — Telehealth: Payer: Self-pay | Admitting: Rheumatology

## 2019-02-19 NOTE — Telephone Encounter (Signed)
Patient left a message stating she needs a refill on Leflunomide, and generic Plaquenil. Patient has not have labs yet, or eye exam. Patient is scheduled for a virtual phone call on Monday 02/23/19.

## 2019-02-20 NOTE — Progress Notes (Signed)
Virtual Visit via Telephone Note  I connected with [place] on 02/23/19 at 11:15 AM EST by telephone and verified that I am speaking with the correct person using two identifiers.  Location: Patient: Home  Provider: Clinic  This service was conducted via virtual visit.  The patient was located at home. I was located in my office.  Consent was obtained prior to the virtual visit and is aware of possible charges through their insurance for this visit.  The patient is an established patient.  Dr. Estanislado Pandy, MD conducted the virtual visit and Hazel Sams, PA-C acted as scribe during the service.  Office staff helped with scheduling follow up visits after the service was conducted.     I discussed the limitations, risks, security and privacy concerns of performing an evaluation and management service by telephone and the availability of in person appointments. I also discussed with the patient that there may be a patient responsible charge related to this service. The patient expressed understanding and agreed to proceed.  CC: Medication monitoring  History of Present Illness: Patient is a 58 year old female with a past medical history of seropositive rheumatoid arthritis and osteoarthritis.  She is taking plaquenil 200 mg 1 tablet BID M-F (Started in March 2020) and Arava 20 mg 1 tablet by mouth daily.  She had a right 2nd MCP joint ultrasound guided cortisone injection on  10/07/18, which was very effective.  She denies any other joint pain or joint swelling. She denies any morning stiffness.  She has not had any difficulties with ADLs.   Review of Systems  Constitutional: Negative for fever and malaise/fatigue.  Eyes: Negative for photophobia, pain, discharge and redness.  Respiratory: Negative for cough, shortness of breath and wheezing.   Cardiovascular: Negative for chest pain and palpitations.  Gastrointestinal: Negative for blood in stool, constipation and diarrhea.  Genitourinary:  Negative for dysuria.  Musculoskeletal: Negative for back pain, joint pain, myalgias and neck pain.  Skin: Negative for rash.  Neurological: Negative for dizziness and headaches.  Psychiatric/Behavioral: Negative for depression. The patient is not nervous/anxious and does not have insomnia.       Observations/Objective: Physical Exam  Constitutional: She is oriented to person, place, and time.  Neurological: She is alert and oriented to person, place, and time.  Psychiatric: Mood, memory, affect and judgment normal.   Patient reports morning stiffness for 0 minutes.   Patient denies nocturnal pain.  Difficulty dressing/grooming: Denies Difficulty climbing stairs: Denies Difficulty getting out of chair: Denies Difficulty using hands for taps, buttons, cutlery, and/or writing: Denies   Assessment and Plan: Visit Diagnoses: Rheumatoid arthritis involving multiple sites with positive rheumatoid factor ([place]) Erosive disease  - She has not had any recent rheumatoid arthritis flares.  She is clinically doing well on combination therapy of plaquenil 200 mg 1 tablet by mouth twice daily M-F and Arava 20 mg 1 tablet by mouth daily.  She is not having any joint pain, joint swelling, or morning stiffness at this time.  She had a right 2nd MCP joint cortisone injection on 10/07/18, which resolved her discomfort and inflammation.  She will continue on the current treatment regimen.  She does not need any refills at this time. She was advised to notify us if she develops increased joint pain or joint swelling.  She will follow up in 4-5 months.   High risk medication use - PLQ 200 mg BID M-F and Arava 20 mg 1 tablet by mouth daily.  CBC and CMP were drawn on 08/26/18.  She is overdue for lab work.  She will require labs every 3 months. Standing orders will be placed today. She is due to update PLQ eye exam.   Primary osteoarthritis of both hands -She is not having any joint pain or joint swelling  currently.  She has not had any morning stiffness.  She has no difficulty with ADLs.  Joint protection and muscle strengthening were discussed.   Primary osteoarthritis of both feet - Hammertoes previously noted.  She has no feet pain or joint swelling currently.   Chronic right shoulder pain - Spurring at the [facility] joint noted. She continues to have intermittent discomfort in the right shoulder joint.    Other medical conditions are listed as follows:   History of basal cell cancer   History of melanoma   History of squamous cell carcinoma   History of hypertension   Anxiety  Follow Up Instructions: She will follow up in 4-5 months.    I discussed the assessment and treatment plan with the patient. The patient was provided an opportunity to ask questions and all were answered. The patient agreed with the plan and demonstrated an understanding of the instructions.   The patient was advised to call back or seek an in-person evaluation if the symptoms worsen or if the condition fails to improve as anticipated.  I provided 15 minutes of non-face-to-face time during this encounter.   Bo Merino, MD   Scribed by-   Hazel Sams, PA-C

## 2019-02-20 NOTE — Telephone Encounter (Signed)
Last Visit: 10/07/2018 Next Visit: 02/23/2019 Labs: 08/26/2018 WNL  Eye exam: no eye exam on file.   Attempted to contact patient and left message on machine advising patient that labs are needed prior to refills. Most recent labs on file are 08/26/2018. Advised patient to call with location so orders can be released.

## 2019-02-23 ENCOUNTER — Other Ambulatory Visit: Payer: Self-pay

## 2019-02-23 ENCOUNTER — Encounter: Payer: Self-pay | Admitting: Rheumatology

## 2019-02-23 ENCOUNTER — Telehealth (INDEPENDENT_AMBULATORY_CARE_PROVIDER_SITE_OTHER): Payer: 59 | Admitting: Rheumatology

## 2019-02-23 DIAGNOSIS — Z8589 Personal history of malignant neoplasm of other organs and systems: Secondary | ICD-10-CM

## 2019-02-23 DIAGNOSIS — Z8679 Personal history of other diseases of the circulatory system: Secondary | ICD-10-CM

## 2019-02-23 DIAGNOSIS — M0579 Rheumatoid arthritis with rheumatoid factor of multiple sites without organ or systems involvement: Secondary | ICD-10-CM

## 2019-02-23 DIAGNOSIS — Z79899 Other long term (current) drug therapy: Secondary | ICD-10-CM

## 2019-02-23 DIAGNOSIS — Z85820 Personal history of malignant melanoma of skin: Secondary | ICD-10-CM

## 2019-02-23 DIAGNOSIS — M19072 Primary osteoarthritis, left ankle and foot: Secondary | ICD-10-CM

## 2019-02-23 DIAGNOSIS — M19041 Primary osteoarthritis, right hand: Secondary | ICD-10-CM

## 2019-02-23 DIAGNOSIS — M19071 Primary osteoarthritis, right ankle and foot: Secondary | ICD-10-CM | POA: Diagnosis not present

## 2019-02-23 DIAGNOSIS — M19042 Primary osteoarthritis, left hand: Secondary | ICD-10-CM

## 2019-02-23 DIAGNOSIS — F419 Anxiety disorder, unspecified: Secondary | ICD-10-CM | POA: Diagnosis not present

## 2019-02-23 DIAGNOSIS — Z85828 Personal history of other malignant neoplasm of skin: Secondary | ICD-10-CM | POA: Diagnosis not present

## 2019-02-25 ENCOUNTER — Other Ambulatory Visit: Payer: Self-pay | Admitting: *Deleted

## 2019-02-25 DIAGNOSIS — Z79899 Other long term (current) drug therapy: Secondary | ICD-10-CM

## 2019-02-26 LAB — COMPLETE METABOLIC PANEL WITH GFR
AG Ratio: 2 (calc) (ref 1.0–2.5)
ALT: 15 U/L (ref 6–29)
AST: 19 U/L (ref 10–35)
Albumin: 4.2 g/dL (ref 3.6–5.1)
Alkaline phosphatase (APISO): 54 U/L (ref 37–153)
BUN: 11 mg/dL (ref 7–25)
CO2: 27 mmol/L (ref 20–32)
Calcium: 9.4 mg/dL (ref 8.6–10.4)
Chloride: 104 mmol/L (ref 98–110)
Creat: 0.8 mg/dL (ref 0.50–1.05)
GFR, Est African American: 94 mL/min/{1.73_m2} (ref >=60)
GFR, Est Non African American: 81 mL/min/{1.73_m2} (ref >=60)
Globulin: 2.1 g/dL (calc) (ref 1.9–3.7)
Glucose, Bld: 81 mg/dL (ref 65–99)
Potassium: 4.3 mmol/L (ref 3.5–5.3)
Sodium: 141 mmol/L (ref 135–146)
Total Bilirubin: 0.5 mg/dL (ref 0.2–1.2)
Total Protein: 6.3 g/dL (ref 6.1–8.1)

## 2019-02-26 LAB — CBC WITH DIFFERENTIAL/PLATELET
Absolute Monocytes: 500 cells/uL (ref 200–950)
Basophils Absolute: 71 cells/uL (ref 0–200)
Basophils Relative: 1.4 %
Eosinophils Absolute: 250 cells/uL (ref 15–500)
Eosinophils Relative: 4.9 %
HCT: 38.7 % (ref 35.0–45.0)
Hemoglobin: 12.9 g/dL (ref 11.7–15.5)
Lymphs Abs: 1005 cells/uL (ref 850–3900)
MCH: 30.4 pg (ref 27.0–33.0)
MCHC: 33.3 g/dL (ref 32.0–36.0)
MCV: 91.1 fL (ref 80.0–100.0)
MPV: 10.3 fL (ref 7.5–12.5)
Monocytes Relative: 9.8 %
Neutro Abs: 3274 cells/uL (ref 1500–7800)
Neutrophils Relative %: 64.2 %
Platelets: 302 10*3/uL (ref 140–400)
RBC: 4.25 10*6/uL (ref 3.80–5.10)
RDW: 11.9 % (ref 11.0–15.0)
Total Lymphocyte: 19.7 %
WBC: 5.1 10*3/uL (ref 3.8–10.8)

## 2019-02-26 NOTE — Progress Notes (Signed)
CBC and CMP WNL

## 2019-02-27 ENCOUNTER — Other Ambulatory Visit: Payer: Self-pay | Admitting: Rheumatology

## 2019-02-27 DIAGNOSIS — M0579 Rheumatoid arthritis with rheumatoid factor of multiple sites without organ or systems involvement: Secondary | ICD-10-CM

## 2019-02-27 MED FILL — LEFLUNOMIDE 20 MG TABLET: 20 | 90 days supply | Qty: 90 | Fill #0

## 2019-02-27 MED FILL — HYDROXYCHLOROQUINE 200 MG T: 200 | 28 days supply | Qty: 40 | Fill #0

## 2019-02-27 NOTE — Telephone Encounter (Signed)
Last Visit: 02/23/2019 telemedicine  Next Visit: message sent to the front desk to schedule.  Labs: 02/25/2019 CBC and CMP WNL Eye exam: no baseline eye exam.   Attempted to contact patient and left message on machine advising patient we need PLQ eye exam.   Okay to refill arava and 30 day supply of PLQ?

## 2019-02-27 NOTE — Telephone Encounter (Signed)
Attempted to contact patient and left message on machine advising patient we need PLQ eye exam ASAP.

## 2019-02-27 NOTE — Telephone Encounter (Signed)
Please advise patient to update eye exam ASAP.  Ok to refill 30 day supply of PLQ until the eye exam is complete.  Ok to refill 90 day supply of [place].

## 2019-03-03 ENCOUNTER — Telehealth: Payer: Self-pay | Admitting: Rheumatology

## 2019-03-03 MED FILL — PARoxetine HCL 20 MG TABS: 20 | 90 days supply | Qty: 90 | Fill #0

## 2019-03-03 NOTE — Telephone Encounter (Signed)
-----   Message from Shona Needles, RT sent at 02/23/2019  2:23 PM EST ----- Regarding: 4-5 MONTH F/U

## 2019-03-03 NOTE — Telephone Encounter (Signed)
LMOM for patient to call and schedule 4-5 month follow-up appointment.

## 2019-03-12 MED FILL — MONTELUKAST SOD 10 MG TAB: 10 | 90 days supply | Qty: 90 | Fill #2

## 2019-03-27 MED FILL — LISINOPRIL-HCTZ 10-12.5 MG: 10-12.5 | 90 days supply | Qty: 90 | Fill #1

## 2019-04-06 ENCOUNTER — Other Ambulatory Visit: Payer: Self-pay | Admitting: Rheumatology

## 2019-04-06 DIAGNOSIS — M0579 Rheumatoid arthritis with rheumatoid factor of multiple sites without organ or systems involvement: Secondary | ICD-10-CM

## 2019-04-06 MED FILL — HYDROXYCHLOROQUINE 200 MG T: 200 | 28 days supply | Qty: 40 | Fill #0

## 2019-04-06 NOTE — Telephone Encounter (Signed)
Ok to refill 30 day supply.  Please advise patient to notify us when her eye exam is scheduled for.

## 2019-04-06 NOTE — Telephone Encounter (Signed)
Last Visit: 02/23/2019 telemedicine  Next Visit: 09/01/19 Labs: 02/25/19 WNL Eye exam: no baseline eye exam.    Spoke with patient to advise we need PLQ eye exam. Patient is calling to get appointment scheduled today.  Okay to refill 30 day supply PLQ?

## 2019-04-13 DIAGNOSIS — Z79899 Other long term (current) drug therapy: Secondary | ICD-10-CM | POA: Diagnosis not present

## 2019-05-04 ENCOUNTER — Telehealth: Payer: Self-pay | Admitting: Rheumatology

## 2019-05-04 NOTE — Telephone Encounter (Signed)
Please schedule a sooner in-office visit to discuss treatment options.

## 2019-05-04 NOTE — Telephone Encounter (Signed)
Patient states she has been having swelling and occasional discomfort of the knuckle of the right index finger. Patient states she had an injection on 10/07/18. Patient states is helped with the swelling. Patient states it has been about 1 month that it has been swelling again. Patient is taking Arava 20 mg daily  and PLQ 200 mg 1 TABLET BY MOUTH TWICE DAILY MONDAY THRU FRIDAY and had missed doses in December but not any since then. Patient has scheduled an appointment to discuss medications on 06/11/19. Please advise.

## 2019-05-04 NOTE — Telephone Encounter (Signed)
Attempted to contact patient and left message for patient to call the office.  

## 2019-05-04 NOTE — Telephone Encounter (Signed)
Attempted to contact the patient and left message for patient to call the office.  

## 2019-05-04 NOTE — Telephone Encounter (Signed)
Patient called stating her right hand index finger is swollen and painful.  Patient states she had a cortisone injection a couple of months ago and also takes Hydroxychloroquine and Leflunomide.  Patient scheduled an appointment with Dr. Estanislado Pandy on 06/11/19 to discuss changing her medication.

## 2019-05-05 NOTE — Progress Notes (Signed)
Office Visit Note  Patient: Kara Campbell             Date of Birth: Mar 28, 1960           MRN: [id number]             PCP: Fanny Bien, MD Referring: Fanny Bien, MD Visit Date: 05/11/2019 Occupation: @GUAROCC @  Subjective:  Discuss medications   History of Present Illness: Kara Campbell is a 59 y.o. female with history of seropositive rheumatoid arthritis and osteoarthritis.  She presents today with ongoing pain and inflammation in the right 2nd MCP joint.  She had a cortisone injection on 10/07/18, which provided relief for several months. The discomfort and inflammation have returned.  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 She has occasional right shoulder joint pain, which was related to falling on the ice this winter. She denies any other joint pain or joint swelling at this time.   Activities of Daily Living:  Patient reports morning stiffness for 30 minutes.   Patient Denies nocturnal pain.  Difficulty dressing/grooming: Denies Difficulty climbing stairs: Denies Difficulty getting out of chair: Denies Difficulty using hands for taps, buttons, cutlery, and/or writing: Denies  Review of Systems  Constitutional: Negative for fatigue.  HENT: Negative for mouth sores, mouth dryness and nose dryness.   Eyes: Negative for pain, visual disturbance and dryness.  Respiratory: Negative for cough, hemoptysis, shortness of breath and difficulty breathing.   Cardiovascular: Negative for chest pain, palpitations, hypertension and swelling in legs/feet.  Gastrointestinal: Negative for blood in stool, constipation and diarrhea.  Endocrine: Negative for excessive thirst and increased urination.  Genitourinary: Negative for difficulty urinating and painful urination.  Musculoskeletal: Positive for arthralgias, joint pain, joint swelling and morning stiffness. Negative for myalgias, muscle weakness, muscle tenderness and myalgias.  Skin: Negative for color change, pallor, rash,  hair loss, nodules/bumps, skin tightness, ulcers and sensitivity to sunlight.  Allergic/Immunologic: Negative for susceptible to infections.  Neurological: Negative for dizziness, numbness, headaches and weakness.  Hematological: Negative for bruising/bleeding tendency and swollen glands.  Psychiatric/Behavioral: Negative for depressed mood and sleep disturbance. The patient is not nervous/anxious.     PMFS History:  Patient Active Problem List   Diagnosis Date Noted  . History of hypertension 10/16/2016  . Essential hypertension 09/11/2016  . Rheumatoid arthritis involving multiple sites with positive rheumatoid factor ([place]) 01/23/2016  . High risk medication use 01/23/2016  . Primary osteoarthritis of both hands 01/23/2016  . Primary osteoarthritis of both feet 01/23/2016  . Malignant melanoma ([place]) 01/23/2016  . Basal cell carcinoma 01/23/2016  . Anxiety 01/23/2016  . Chest congestion 06/07/2015    Past Medical History:  Diagnosis Date  . Arthritis   . Melanoma ([place]) rhe  . Rheumatoid arthritis(714.0)     Family History  Problem Relation Age of Onset  . Cancer Mother        breast  . Breast cancer Mother 44  . Rheum arthritis Other    Past Surgical History:  Procedure Laterality Date  . CESAREAN SECTION    . INJECTION KNEE Right    Fluid removed from knee x2   . MELANOMA EXCISION     Social History   Social History Narrative  . Not on file    There is no immunization history on file for this patient.   Objective: Vital Signs: BP 105/66 (BP Location: Left Arm, Patient Position: Sitting, Cuff Size: Normal)   Pulse 64   Resp 14  Ht 5' 4.5" (1.638 m)   Wt 162 lb 6.4 oz (73.7 kg)   BMI 27.45 kg/m    Physical Exam Vitals and nursing note reviewed.  Constitutional:      Appearance: She is well-developed.  HENT:     Head: Normocephalic and atraumatic.  Eyes:     Conjunctiva/sclera: Conjunctivae normal.  Cardiovascular:     Rate and Rhythm: Normal rate  and regular rhythm.     Heart sounds: Normal heart sounds.  Pulmonary:     Effort: Pulmonary effort is normal.     Breath sounds: Normal breath sounds.  Abdominal:     General: Bowel sounds are normal.     Palpations: Abdomen is soft.  Musculoskeletal:     Cervical back: Normal range of motion.  Lymphadenopathy:     Cervical: No cervical adenopathy.  Skin:    General: Skin is warm and dry.     Capillary Refill: Capillary refill takes less than 2 seconds.  Neurological:     Mental Status: She is alert and oriented to person, place, and time.  Psychiatric:        Behavior: Behavior normal.      Musculoskeletal Exam: C-spine, thoracic spine, and lumbar spine good ROM.  No midline spinal tenderness.  Shoulder joints, elbow joints, wrist joints, MCPs, PIPs, and DIPs good ROM.  Tenderness and synovitis of the right 2nd MCP joint.  DIP synovial thickening.  Hip joints, knee joints, ankle joints, MTPs, PIPs, and DIPs good ROM with no synovitis.  No warmth or effusion of knee joints.  No tenderness or swelling of ankle joints.    CDAI Exam: CDAI Score: 3.2  Patient Global: 7 mm; Provider Global: 5 mm Swollen: 1 ; Tender: 1  Joint Exam 05/11/2019      Right  Left  MCP 2  Swollen Tender        Investigation: No additional findings.  Imaging: No results found.  Recent Labs: Lab Results  Component Value Date   WBC 5.1 02/25/2019   HGB 12.9 02/25/2019   PLT 302 02/25/2019   NA 141 02/25/2019   K 4.3 02/25/2019   CL 104 02/25/2019   CO2 27 02/25/2019   GLUCOSE 81 02/25/2019   BUN 11 02/25/2019   CREATININE 0.80 02/25/2019   BILITOT 0.5 02/25/2019   ALKPHOS 59 10/23/2016   AST 19 02/25/2019   ALT 15 02/25/2019   PROT 6.3 02/25/2019   ALBUMIN 4.2 10/23/2016   CALCIUM 9.4 02/25/2019   GFRAA 94 02/25/2019   QFTBGOLDPLUS NEGATIVE 08/26/2018    Speciality Comments: PLQ Eye exam: 04/13/19 WNL @ [facility] Follow up in 1 year  Procedures:  Small Joint Inj: R index  MCP on 05/11/2019 8:35 AM Indications: pain and joint swelling Details: 27 G needle, ultrasound-guided posterior approach Medications: 0.5 mL lidocaine 1 %; 10 mg triamcinolone acetonide 40 MG/ML Aspirate: 0 mL Outcome: tolerated well, no immediate complications Procedure, treatment alternatives, risks and benefits explained, specific risks discussed. Consent was given by the patient. Immediately prior to procedure a time out was called to verify the correct patient, procedure, equipment, support staff and site/side marked as required. Patient was prepped and draped in the usual sterile fashion.     Allergies: Amoxil [amoxicillin]   Assessment / Plan:     Visit Diagnoses: Rheumatoid arthritis involving multiple sites with positive rheumatoid factor (HCC) Erosive disease: She has tenderness and synovitis of the right second MCP joint on exam.  She has been  experiencing discomfort and inflammation for the past 1 week.  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  She has no other joint pain or inflammation at this time.  Overall she is clinically been doing well on Plaquenil 200 mg 1 tablet by mouth twice daily Monday through Friday and Arava 20 mg a mouth daily.  We discussed that her medication options are limited due to her history of melanoma.  We discussed trying a second ultrasound-guided second MCP joint cortisone injection today.  She was in agreement.  She tolerated the procedure well.  The procedure note was completed above.  Aftercare was discussed.  She will continue taking Plaquenil and Arava as discussed.  She was advised to notify us if she develops increased joint pain or joint swelling.  She will follow-up in the office in 5 months.  High risk medication use - PLQ 200 mg 1 tablet by mouth BID M-F and Arava 20 mg 1 tablet by mouth daily. Eye exam: 04/13/19. CBC and CMP WNL on 02/25/19.  CBC and CMP will be drawn today to monitor for drug toxicity.  She will return  for lab work in June and every 3 months to monitor for drug toxicity.  Standing orders are in place.  Primary osteoarthritis of both hands: She has DIP synovial thickening consistent with osteoarthritis of both hands.  Joint protection and muscle strengthening were discussed.  Primary osteoarthritis of both feet - Hammertoes bilaterally.  We discussed the use of metatarsal pads and importance of wearing proper fitting shoes.  Other medical conditions are listed as follows:   History of basal cell cancer  History of melanoma  History of hypertension  History of squamous cell carcinoma  Anxiety  Orders: Orders Placed This Encounter  Procedures  . Hand/UE Inj  . Small Joint Inj  . COMPLETE METABOLIC PANEL WITH GFR  . CBC with Differential/Platelet   No orders of the defined types were placed in this encounter.   Face-to-face time spent with patient was 30 minutes. Greater than 50% of time was spent in counseling and coordination of care.  Follow-Up Instructions: Return in about 5 months (around 10/11/2019) for Rheumatoid arthritis, Osteoarthritis.   Ofilia Neas, PA-C   I examined and evaluated the patient with Kara Sams PA.  Patient has been experiencing increased pain and swelling of her right second MCP joint on examination.  None of the other joints are painful.  She had done very well after the last injection to her MCP in July.  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  We discussed different treatment options.  She wants to stay on the current regimen.  Per her request right second MCP joint was injected under ultrasound guidance which she tolerated well.  Post procedure precautions were discussed.  The plan of care was discussed as noted above.  Bo Merino, MD    Note - This record has been created using Editor, commissioning.  Chart creation errors have been sought, but may not always  have been located. Such creation errors do not reflect on  the standard  of medical care.

## 2019-05-05 NOTE — Telephone Encounter (Signed)
Spoke with patient and scheduled for 05/11/19 at 8:20 am

## 2019-05-07 ENCOUNTER — Other Ambulatory Visit: Payer: Self-pay | Admitting: *Deleted

## 2019-05-07 DIAGNOSIS — M0579 Rheumatoid arthritis with rheumatoid factor of multiple sites without organ or systems involvement: Secondary | ICD-10-CM

## 2019-05-07 MED ORDER — HYDROXYCHLOROQUINE SULFATE 200 MG PO TABS: ORAL_TABLET | ORAL | 0 refills | Status: DC

## 2019-05-07 MED FILL — HYDROXYCHLOROQUINE 200 MG T: 200 | 84 days supply | Qty: 120 | Fill #0

## 2019-05-07 NOTE — Telephone Encounter (Signed)
Patient states she needs refill on PLQ  Last Visit:02/23/2019 telemedicine Next Visit:09/01/19 Labs: 02/25/19 WNL Eye exam:04/13/19 WNL  Okay to refill per Dr. Estanislado Pandy

## 2019-05-11 ENCOUNTER — Other Ambulatory Visit: Payer: Self-pay

## 2019-05-11 ENCOUNTER — Ambulatory Visit: Payer: 59 | Admitting: Physician Assistant

## 2019-05-11 ENCOUNTER — Encounter: Payer: Self-pay | Admitting: Physician Assistant

## 2019-05-11 VITALS — BP 105/66 | HR 64 | Resp 14 | Ht 64.5 in | Wt 162.4 lb

## 2019-05-11 DIAGNOSIS — M19042 Primary osteoarthritis, left hand: Secondary | ICD-10-CM

## 2019-05-11 DIAGNOSIS — M659 Synovitis and tenosynovitis, unspecified: Secondary | ICD-10-CM | POA: Diagnosis not present

## 2019-05-11 DIAGNOSIS — Z85828 Personal history of other malignant neoplasm of skin: Secondary | ICD-10-CM | POA: Diagnosis not present

## 2019-05-11 DIAGNOSIS — M0579 Rheumatoid arthritis with rheumatoid factor of multiple sites without organ or systems involvement: Secondary | ICD-10-CM | POA: Diagnosis not present

## 2019-05-11 DIAGNOSIS — Z8679 Personal history of other diseases of the circulatory system: Secondary | ICD-10-CM

## 2019-05-11 DIAGNOSIS — M19041 Primary osteoarthritis, right hand: Secondary | ICD-10-CM

## 2019-05-11 DIAGNOSIS — M8589 Other specified disorders of bone density and structure, multiple sites: Secondary | ICD-10-CM

## 2019-05-11 DIAGNOSIS — Z85820 Personal history of malignant melanoma of skin: Secondary | ICD-10-CM

## 2019-05-11 DIAGNOSIS — Z79899 Other long term (current) drug therapy: Secondary | ICD-10-CM | POA: Diagnosis not present

## 2019-05-11 DIAGNOSIS — M19071 Primary osteoarthritis, right ankle and foot: Secondary | ICD-10-CM | POA: Diagnosis not present

## 2019-05-11 DIAGNOSIS — F419 Anxiety disorder, unspecified: Secondary | ICD-10-CM

## 2019-05-11 DIAGNOSIS — M19072 Primary osteoarthritis, left ankle and foot: Secondary | ICD-10-CM

## 2019-05-11 DIAGNOSIS — Z8589 Personal history of malignant neoplasm of other organs and systems: Secondary | ICD-10-CM

## 2019-05-11 MED ORDER — LIDOCAINE HCL 1 % IJ SOLN
0.5000 mL | INTRAMUSCULAR | Status: AC | PRN
  Administered 2019-05-11: .5 mL

## 2019-05-11 MED ORDER — TRIAMCINOLONE ACETONIDE 40 MG/ML IJ SUSP
10.0000 mg | INTRAMUSCULAR | Status: AC | PRN
  Administered 2019-05-11: 10 mg via INTRA_ARTICULAR

## 2019-05-11 NOTE — Patient Instructions (Signed)
Standing Labs We placed an order today for your standing lab work.    Please come back and get your standing labs in June and every 3 months.   We have open lab daily Monday through Thursday from 8:30-12:30 PM and 1:30-4:30 PM and Friday from 8:30-12:30 PM and 1:30-4:00 PM at the office of Dr. Shaili Deveshwar.   You may experience shorter wait times on Monday and Friday afternoons. The office is located at [address], [address], [place], [place] [address] No appointment is necessary.   Labs are drawn by [facility].  You may receive a bill from [facility] for your lab work.  If you wish to have your labs drawn at another location, please call the office 24 hours in advance to send orders.  If you have any questions regarding directions or hours of operation,  please call [phone].   Just as a reminder please drink plenty of water prior to coming for your lab work. Thanks!  

## 2019-05-12 LAB — COMPLETE METABOLIC PANEL WITH GFR
AG Ratio: 1.9 (calc) (ref 1.0–2.5)
ALT: 19 U/L (ref 6–29)
AST: 22 U/L (ref 10–35)
Albumin: 4.3 g/dL (ref 3.6–5.1)
Alkaline phosphatase (APISO): 52 U/L (ref 37–153)
BUN: 17 mg/dL (ref 7–25)
CO2: 28 mmol/L (ref 20–32)
Calcium: 9.7 mg/dL (ref 8.6–10.4)
Chloride: 105 mmol/L (ref 98–110)
Creat: 0.72 mg/dL (ref 0.50–1.05)
GFR, Est African American: 107 mL/min/{1.73_m2} (ref >=60)
GFR, Est Non African American: 92 mL/min/{1.73_m2} (ref >=60)
Globulin: 2.3 g/dL (calc) (ref 1.9–3.7)
Glucose, Bld: 89 mg/dL (ref 65–99)
Potassium: 4 mmol/L (ref 3.5–5.3)
Sodium: 142 mmol/L (ref 135–146)
Total Bilirubin: 0.5 mg/dL (ref 0.2–1.2)
Total Protein: 6.6 g/dL (ref 6.1–8.1)

## 2019-05-12 LAB — CBC WITH DIFFERENTIAL/PLATELET
Absolute Monocytes: 461 cells/uL (ref 200–950)
Basophils Absolute: 78 cells/uL (ref 0–200)
Basophils Relative: 1.6 %
Eosinophils Absolute: 382 cells/uL (ref 15–500)
Eosinophils Relative: 7.8 %
HCT: 39.2 % (ref 35.0–45.0)
Hemoglobin: 13 g/dL (ref 11.7–15.5)
Lymphs Abs: 1083 cells/uL (ref 850–3900)
MCH: 30.4 pg (ref 27.0–33.0)
MCHC: 33.2 g/dL (ref 32.0–36.0)
MCV: 91.6 fL (ref 80.0–100.0)
MPV: 9.9 fL (ref 7.5–12.5)
Monocytes Relative: 9.4 %
Neutro Abs: 2896 cells/uL (ref 1500–7800)
Neutrophils Relative %: 59.1 %
Platelets: 284 10*3/uL (ref 140–400)
RBC: 4.28 10*6/uL (ref 3.80–5.10)
RDW: 12.3 % (ref 11.0–15.0)
Total Lymphocyte: 22.1 %
WBC: 4.9 10*3/uL (ref 3.8–10.8)

## 2019-05-12 NOTE — Progress Notes (Signed)
CBC and CMP WNL

## 2019-06-01 ENCOUNTER — Other Ambulatory Visit: Payer: Self-pay | Admitting: Rheumatology

## 2019-06-01 MED FILL — LEFLUNOMIDE 20 MG TABLET: 20 | 90 days supply | Qty: 90 | Fill #0

## 2019-06-01 MED FILL — PARoxetine HCL 20 MG TABS: 20 | 90 days supply | Qty: 90 | Fill #1

## 2019-06-01 MED FILL — MONTELUKAST SOD 10 MG TAB: 10 | 90 days supply | Qty: 90 | Fill #3

## 2019-06-01 NOTE — Telephone Encounter (Signed)
Last Visit: 05/11/19 Next visit: 10/12/19 Labs: 05/11/19 WNL   Current Dose per office note 05/11/19: Arava 20 mg a mouth daily  Okay to refill per Dr. Estanislado Pandy

## 2019-06-11 ENCOUNTER — Ambulatory Visit: Payer: 59 | Admitting: Rheumatology

## 2019-07-03 MED FILL — LISINOPRIL-HCTZ 10-12.5 MG: 10-12.5 | 90 days supply | Qty: 90 | Fill #2

## 2019-08-06 ENCOUNTER — Other Ambulatory Visit: Payer: Self-pay | Admitting: Rheumatology

## 2019-08-06 DIAGNOSIS — M0579 Rheumatoid arthritis with rheumatoid factor of multiple sites without organ or systems involvement: Secondary | ICD-10-CM

## 2019-08-06 MED FILL — HYDROXYCHLOROQUINE 200 MG T: 200 | 84 days supply | Qty: 120 | Fill #0

## 2019-08-06 NOTE — Telephone Encounter (Signed)
Last Visit: 05/11/19 Next visit: 10/12/19 Labs: 05/11/19 WNL  PLQ Eye exam: 04/13/19 WNL  Current Dose per office note on PLQ 200 mg 1 tablet by mouth BID M-F   Okay to refill per Dr. Estanislado Pandy

## 2019-09-01 ENCOUNTER — Ambulatory Visit: Payer: 59 | Admitting: Physician Assistant

## 2019-09-09 ENCOUNTER — Other Ambulatory Visit: Payer: Self-pay | Admitting: Rheumatology

## 2019-09-09 ENCOUNTER — Other Ambulatory Visit: Payer: Self-pay

## 2019-09-09 DIAGNOSIS — Z79899 Other long term (current) drug therapy: Secondary | ICD-10-CM | POA: Diagnosis not present

## 2019-09-09 MED FILL — LEFLUNOMIDE 20 MG TABLET: 20 | 30 days supply | Qty: 30 | Fill #0

## 2019-09-09 NOTE — Telephone Encounter (Signed)
Last Visit: 05/11/19 Next visit: 10/12/19 Labs: 05/11/19 WNL   Current Dose per office note 05/11/19: Arava 20 mg a mouth daily  Attempted to contact the patient and left message for patient to advise she is due to update labs.   Okay to refill 30 day supply Arava?

## 2019-09-10 LAB — COMPLETE METABOLIC PANEL WITH GFR
AG Ratio: 2.1 (calc) (ref 1.0–2.5)
ALT: 14 U/L (ref 6–29)
AST: 19 U/L (ref 10–35)
Albumin: 4.4 g/dL (ref 3.6–5.1)
Alkaline phosphatase (APISO): 50 U/L (ref 37–153)
BUN: 13 mg/dL (ref 7–25)
CO2: 27 mmol/L (ref 20–32)
Calcium: 9.4 mg/dL (ref 8.6–10.4)
Chloride: 103 mmol/L (ref 98–110)
Creat: 0.88 mg/dL (ref 0.50–1.05)
GFR, Est African American: 84 mL/min/{1.73_m2} (ref >=60)
GFR, Est Non African American: 72 mL/min/{1.73_m2} (ref >=60)
Globulin: 2.1 g/dL (calc) (ref 1.9–3.7)
Glucose, Bld: 133 mg/dL — ABNORMAL HIGH (ref 65–99)
Potassium: 3.5 mmol/L (ref 3.5–5.3)
Sodium: 139 mmol/L (ref 135–146)
Total Bilirubin: 0.7 mg/dL (ref 0.2–1.2)
Total Protein: 6.5 g/dL (ref 6.1–8.1)

## 2019-09-10 LAB — CBC WITH DIFFERENTIAL/PLATELET
Absolute Monocytes: 549 cells/uL (ref 200–950)
Basophils Absolute: 71 cells/uL (ref 0–200)
Basophils Relative: 1.2 %
Eosinophils Absolute: 307 cells/uL (ref 15–500)
Eosinophils Relative: 5.2 %
HCT: 36.1 % (ref 35.0–45.0)
Hemoglobin: 11.8 g/dL (ref 11.7–15.5)
Lymphs Abs: 1339 cells/uL (ref 850–3900)
MCH: 29.7 pg (ref 27.0–33.0)
MCHC: 32.7 g/dL (ref 32.0–36.0)
MCV: 90.9 fL (ref 80.0–100.0)
MPV: 10.3 fL (ref 7.5–12.5)
Monocytes Relative: 9.3 %
Neutro Abs: 3634 cells/uL (ref 1500–7800)
Neutrophils Relative %: 61.6 %
Platelets: 276 10*3/uL (ref 140–400)
RBC: 3.97 10*6/uL (ref 3.80–5.10)
RDW: 12.1 % (ref 11.0–15.0)
Total Lymphocyte: 22.7 %
WBC: 5.9 10*3/uL (ref 3.8–10.8)

## 2019-09-10 NOTE — Progress Notes (Signed)
Glucose is elevated-133.  Rest of CMP WNL.  CBC WNL.

## 2019-09-11 MED FILL — PARoxetine HCL 20 MG TABS: 20 | 10 days supply | Qty: 10 | Fill #0

## 2019-09-16 ENCOUNTER — Other Ambulatory Visit (HOSPITAL_COMMUNITY): Payer: Self-pay | Admitting: Family Medicine

## 2019-09-16 DIAGNOSIS — F411 Generalized anxiety disorder: Secondary | ICD-10-CM | POA: Diagnosis not present

## 2019-09-16 DIAGNOSIS — I10 Essential (primary) hypertension: Secondary | ICD-10-CM | POA: Diagnosis not present

## 2019-09-16 DIAGNOSIS — F331 Major depressive disorder, recurrent, moderate: Secondary | ICD-10-CM | POA: Diagnosis not present

## 2019-09-16 DIAGNOSIS — G4700 Insomnia, unspecified: Secondary | ICD-10-CM | POA: Diagnosis not present

## 2019-09-16 DIAGNOSIS — J309 Allergic rhinitis, unspecified: Secondary | ICD-10-CM | POA: Diagnosis not present

## 2019-09-16 MED FILL — MONTELUKAST SOD 10 MG TAB: 10 | 90 days supply | Qty: 90 | Fill #0

## 2019-09-16 MED FILL — AZELASTINE HCL 137 MCG SPRY: 0.1 | 25 days supply | Qty: 30 | Fill #0

## 2019-09-16 MED FILL — VALSARTAN-HCTZ 160-25 MG TA: 160-25 | 90 days supply | Qty: 90 | Fill #0

## 2019-09-19 MED FILL — PARoxetine HCL 20 MG TABS: 20 | 90 days supply | Qty: 90 | Fill #0

## 2019-09-28 NOTE — Progress Notes (Deleted)
Office Visit Note  Patient: Kara Campbell             Date of Birth: 05/04/1960           MRN: [id number]             PCP: Fanny Bien, MD Referring: Fanny Bien, MD Visit Date: 10/12/2019 Occupation: @GUAROCC @  Subjective:  No chief complaint on file.   History of Present Illness: Kara Campbell is a 59 y.o. female ***   Activities of Daily Living:  Patient reports morning stiffness for *** {minute/hour:19697}.   Patient {ACTIONS;DENIES/REPORTS:21021675::"Denies"} nocturnal pain.  Difficulty dressing/grooming: {ACTIONS;DENIES/REPORTS:21021675::"Denies"} Difficulty climbing stairs: {ACTIONS;DENIES/REPORTS:21021675::"Denies"} Difficulty getting out of chair: {ACTIONS;DENIES/REPORTS:21021675::"Denies"} Difficulty using hands for taps, buttons, cutlery, and/or writing: {ACTIONS;DENIES/REPORTS:21021675::"Denies"}  No Rheumatology ROS completed.   PMFS History:  Patient Active Problem List   Diagnosis Date Noted  . History of hypertension 10/16/2016  . Essential hypertension 09/11/2016  . Rheumatoid arthritis involving multiple sites with positive rheumatoid factor ([place]) 01/23/2016  . High risk medication use 01/23/2016  . Primary osteoarthritis of both hands 01/23/2016  . Primary osteoarthritis of both feet 01/23/2016  . Malignant melanoma ([place]) 01/23/2016  . Basal cell carcinoma 01/23/2016  . Anxiety 01/23/2016  . Chest congestion 06/07/2015    Past Medical History:  Diagnosis Date  . Arthritis   . Melanoma ([place]) rhe  . Rheumatoid arthritis(714.0)     Family History  Problem Relation Age of Onset  . Cancer Mother        breast  . Breast cancer Mother 27  . Rheum arthritis Other    Past Surgical History:  Procedure Laterality Date  . CESAREAN SECTION    . INJECTION KNEE Right    Fluid removed from knee x2   . MELANOMA EXCISION     Social History   Social History Narrative  . Not on file    There is no immunization history on file for  this patient.   Objective: Vital Signs: There were no vitals taken for this visit.   Physical Exam   Musculoskeletal Exam: ***  CDAI Exam: CDAI Score: -- Patient Global: --; Provider Global: -- Swollen: --; Tender: -- Joint Exam 10/12/2019   No joint exam has been documented for this visit   There is currently no information documented on the homunculus. Go to the Rheumatology activity and complete the homunculus joint exam.  Investigation: No additional findings.  Imaging: No results found.  Recent Labs: Lab Results  Component Value Date   WBC 5.9 09/09/2019   HGB 11.8 09/09/2019   PLT 276 09/09/2019   NA 139 09/09/2019   K 3.5 09/09/2019   CL 103 09/09/2019   CO2 27 09/09/2019   GLUCOSE 133 (H) 09/09/2019   BUN 13 09/09/2019   CREATININE 0.88 09/09/2019   BILITOT 0.7 09/09/2019   ALKPHOS 59 10/23/2016   AST 19 09/09/2019   ALT 14 09/09/2019   PROT 6.5 09/09/2019   ALBUMIN 4.2 10/23/2016   CALCIUM 9.4 09/09/2019   GFRAA 84 09/09/2019   QFTBGOLDPLUS NEGATIVE 08/26/2018    Speciality Comments: PLQ Eye exam: 04/13/19 WNL @ [facility] Follow up in 1 year  Procedures:  No procedures performed Allergies: Amoxil [amoxicillin]   Assessment / Plan:     Visit Diagnoses: No diagnosis found.  Orders: No orders of the defined types were placed in this encounter.  No orders of the defined types were placed in this encounter.   Face-to-face time  spent with patient was *** minutes. Greater than 50% of time was spent in counseling and coordination of care.  Follow-Up Instructions: No follow-ups on file.   Earnestine Mealing, CMA  Note - This record has been created using Editor, commissioning.  Chart creation errors have been sought, but may not always  have been located. Such creation errors do not reflect on  the standard of medical care.

## 2019-10-07 NOTE — Progress Notes (Addendum)
Office Visit Note  Patient: Kara Campbell             Date of Birth: 02/18/1961           MRN: [id number]             PCP: Fanny Bien, MD Referring: Fanny Bien, MD Visit Date: 10/12/2019 Occupation: @GUAROCC @  Subjective:  Right 2nd MCP joint   History of Present Illness: Kara Campbell is a 59 y.o. female with history of seropositive rheumatoid arthritis and osteoarthritis.  She is taking plaquenil 200 mg 1 tablet by mouth twice daily M-F and Arava 20 mg 1 tablet by mouth daily.  She is tolerating both medications without any side effects.  She denies any recent rheumatoid arthritis flares.  She states that she has persistent swelling in her right second MCP is not having any discomfort at this time.  She has no difficulty with ADLs and is able to make a complete fist.  She denies any other joint pain or joint swelling at this time.  She has not had any neck pain or lower back pain recently. She denies any recent infections.  She has received both COVID-19 vaccinations.   Activities of Daily Living:  Patient reports morning stiffness for 0 none.   Patient Denies nocturnal pain.  Difficulty dressing/grooming: Denies Difficulty climbing stairs: Denies Difficulty getting out of chair: Denies Difficulty using hands for taps, buttons, cutlery, and/or writing: Denies  Review of Systems  Constitutional: Negative for fatigue.  HENT: Negative for mouth sores, mouth dryness and nose dryness.   Eyes: Negative for pain, visual disturbance and dryness.  Respiratory: Negative for cough, hemoptysis, shortness of breath and difficulty breathing.   Cardiovascular: Negative for chest pain, palpitations, hypertension and swelling in legs/feet.  Gastrointestinal: Negative for blood in stool, constipation and diarrhea.  Endocrine: Negative for increased urination.  Genitourinary: Negative for painful urination.  Musculoskeletal: Positive for joint swelling. Negative for  arthralgias, joint pain, myalgias, muscle weakness, morning stiffness, muscle tenderness and myalgias.  Skin: Negative for color change, pallor, rash, hair loss, nodules/bumps, skin tightness, ulcers and sensitivity to sunlight.  Allergic/Immunologic: Negative for susceptible to infections.  Neurological: Negative for dizziness, numbness, headaches and weakness.  Hematological: Negative for bruising/bleeding tendency and swollen glands.  Psychiatric/Behavioral: Negative for depressed mood and sleep disturbance. The patient is not nervous/anxious.     PMFS History:  Patient Active Problem List   Diagnosis Date Noted  . History of hypertension 10/16/2016  . Essential hypertension 09/11/2016  . Rheumatoid arthritis involving multiple sites with positive rheumatoid factor ([place]) 01/23/2016  . High risk medication use 01/23/2016  . Primary osteoarthritis of both hands 01/23/2016  . Primary osteoarthritis of both feet 01/23/2016  . Malignant melanoma ([place]) 01/23/2016  . Basal cell carcinoma 01/23/2016  . Anxiety 01/23/2016  . Chest congestion 06/07/2015    Past Medical History:  Diagnosis Date  . Arthritis   . Melanoma ([place]) rhe  . Rheumatoid arthritis(714.0)     Family History  Problem Relation Age of Onset  . Cancer Mother        breast  . Breast cancer Mother 38  . Rheum arthritis Other    Past Surgical History:  Procedure Laterality Date  . CESAREAN SECTION    . INJECTION KNEE Right    Fluid removed from knee x2   . MELANOMA EXCISION     Social History   Social History Narrative  . Not on file  There is no immunization history on file for this patient.   Objective: Vital Signs: BP 129/80 (BP Location: Left Arm, Patient Position: Sitting, Cuff Size: Normal)   Pulse 61   Resp 14   Ht 5\' 5"  (1.651 m)   Wt 164 lb (74.4 kg)   BMI 27.29 kg/m    Physical Exam Vitals and nursing note reviewed.  Constitutional:      Appearance: She is well-developed.  HENT:      Head: Normocephalic and atraumatic.  Eyes:     Conjunctiva/sclera: Conjunctivae normal.  Pulmonary:     Effort: Pulmonary effort is normal.  Abdominal:     General: Bowel sounds are normal.     Palpations: Abdomen is soft.  Musculoskeletal:     Cervical back: Normal range of motion.  Lymphadenopathy:     Cervical: No cervical adenopathy.  Skin:    General: Skin is warm and dry.     Capillary Refill: Capillary refill takes less than 2 seconds.  Neurological:     Mental Status: She is alert and oriented to person, place, and time.  Psychiatric:        Behavior: Behavior normal.      Musculoskeletal Exam: C-spine, thoracic spine, and lumbar spine have good range of motion.  Shoulder joints, elbow joints and wrist joints have good range of motion with no synovitis.  She has tenderness and synovitis of the right second MCP joint.  PIP and DIP thickening consistent with osteoarthritis of both hands noted.  Hip joints have good range of motion with no discomfort.  Knee joints have good range of motion with no warmth or effusion.  Ankle joints have good range of motion with no tenderness or inflammation.  Hammertoes noted.  PIP and DIP thickening consistent with osteoarthritis of both feet on exam.  CDAI Exam: CDAI Score: 2.6  Patient Global: 3 mm; Provider Global: 3 mm Swollen: 1 ; Tender: 1  Joint Exam 10/12/2019      Right  Left  MCP 2  Swollen Tender        Investigation: No additional findings.  Imaging: No results found.  Recent Labs: Lab Results  Component Value Date   WBC 5.9 09/09/2019   HGB 11.8 09/09/2019   PLT 276 09/09/2019   NA 139 09/09/2019   K 3.5 09/09/2019   CL 103 09/09/2019   CO2 27 09/09/2019   GLUCOSE 133 (H) 09/09/2019   BUN 13 09/09/2019   CREATININE 0.88 09/09/2019   BILITOT 0.7 09/09/2019   ALKPHOS 59 10/23/2016   AST 19 09/09/2019   ALT 14 09/09/2019   PROT 6.5 09/09/2019   ALBUMIN 4.2 10/23/2016   CALCIUM 9.4 09/09/2019   GFRAA 84  09/09/2019   QFTBGOLDPLUS NEGATIVE 08/26/2018    Speciality Comments: PLQ Eye exam: 04/13/19 WNL @ [facility] Follow up in 1 year  Procedures:  No procedures performed Allergies: Amoxil [amoxicillin]   Assessment / Plan:     Visit Diagnoses: Rheumatoid arthritis involving multiple sites with positive rheumatoid factor ([place]) Erosive disease: She has persistent tenderness and synovitis of the right second MCP joint.  She had a cortisone injection performed on 05/11/2019 which provided temporary relief.  She continues to take Plaquenil 200 mg 1 tablet by mouth twice daily Monday through Friday and Arava 20 mg 1 tablet by mouth daily.  She is tolerating both medications without any side effects and has not missed any doses recently.  She is not having any other joint  pain or inflammation at this time.  X-rays of both hands and feet were updated today to assess for radiographic progression.  We will call her with these results.  She will continue taking Plaquenil and Arava as prescribed.  Refills of both medications were sent to the pharmacy today.  She was advised to notify us if she develops increased joint pain or joint swelling.  She will follow-up in the office in 5 months.- Plan: XR Hand 2 View Right, XR Hand 2 View Left, XR Foot 2 Views Right, XR Foot 2 Views Left  High risk medication use - PLQ 200 mg 1 tablet by mouth BID M-F and Arava 20 mg 1 tablet by mouth daily. CBC WNL and glucose was 133, rest of CMP WNL on 09/09/19.  She will be due to update CBC and CMP in September and every 3 months to monitor for drug toxicity.  Standing orders for CBC and CMP are in place.   She has received both COVID-19 vaccinations.  She has not had any recent infections.  We discussed the importance of receiving the Covid antibody infusion if she does develop COVID-19.  Primary osteoarthritis of both hands -She has PIP and DIP thickening consistent with osteoarthritis of both hands.  No tenderness or  inflammation was noted.  She is able to make a complete fist.  She has no difficulty performing ADLs.  X-rays of both hands were updated today.  Joint protection and muscle strengthening were discussed plan: XR Hand 2 View Right, XR Hand 2 View Left  Primary osteoarthritis of both feet -She has PIP and DIP thickening consistent with osteoarthritis of both feet.  Hammertoes noted.  We discussed the importance of wearing proper fitting shoes.  She has been walking several miles daily for exercise.  X-rays of both feet were obtained today.  Plan: XR Foot 2 Views Right, XR Foot 2 Views Left  Osteopenia of multiple sites: DEXA performed on 10/23/2016 T-score -2.2.  She is due to update DEXA.  She will be reaching out to her gynecologist to order her DEXA.  She was encouraged to continue to take vitamin D 2000 units daily.  Other medical conditions are listed as follows:   History of basal cell cancer  History of melanoma  History of squamous cell carcinoma  History of hypertension  Anxiety  Orders: Orders Placed This Encounter  Procedures  . XR Hand 2 View Right  . XR Hand 2 View Left  . XR Foot 2 Views Right  . XR Foot 2 Views Left   Meds ordered this encounter  Medications  . leflunomide (ARAVA) 20 MG tablet    Sig: Take 1 tablet (20 mg total) by mouth daily.    Dispense:  90 tablet    Refill:  0  . hydroxychloroquine (PLAQUENIL) 200 MG tablet    Sig: Take 1 tablet (200 mg) by mouth twice daily Monday through Friday only.    Dispense:  120 tablet    Refill:  0     Follow-Up Instructions: Return in about 5 months (around 03/13/2020) for Rheumatoid arthritis, Osteoarthritis.   Ofilia Neas, PA-C   October 16, 2019 addendum:  I reviewed patient's x-rays from October 07, 2019 and reviewed the results with her.  She has erosive changes in her bilateral feet and right second MCP.  She will need more aggressive therapy than Arava and Plaquenil combination.  She had melanoma 19 years ago.   She has been followed by Dr. Ubaldo Glassing.  She will discuss with Dr. Ubaldo Glassing about the treatment options.  I discussed with her that anti-TNF's has increased association of nonmelanoma skin cancer.  Other treatment options could be Orencia, Actemra, Kevzara or JAK-1.  Once she gets clearance from Dr. Ubaldo Glassing we can start her on more aggressive therapy.  Patient stated that she will discuss with Dr. Ubaldo Glassing and then will contact us. Bo Merino, MD  Note - This record has been created using Editor, commissioning.  Chart creation errors have been sought, but may not always  have been located. Such creation errors do not reflect on  the standard of medical care.

## 2019-10-12 ENCOUNTER — Ambulatory Visit: Payer: Self-pay

## 2019-10-12 ENCOUNTER — Ambulatory Visit: Payer: 59 | Admitting: Physician Assistant

## 2019-10-12 ENCOUNTER — Other Ambulatory Visit: Payer: Self-pay

## 2019-10-12 ENCOUNTER — Encounter: Payer: Self-pay | Admitting: Physician Assistant

## 2019-10-12 VITALS — BP 129/80 | HR 61 | Resp 14 | Ht 65.0 in | Wt 164.0 lb

## 2019-10-12 DIAGNOSIS — M19072 Primary osteoarthritis, left ankle and foot: Secondary | ICD-10-CM | POA: Diagnosis not present

## 2019-10-12 DIAGNOSIS — M19041 Primary osteoarthritis, right hand: Secondary | ICD-10-CM | POA: Diagnosis not present

## 2019-10-12 DIAGNOSIS — M19042 Primary osteoarthritis, left hand: Secondary | ICD-10-CM | POA: Diagnosis not present

## 2019-10-12 DIAGNOSIS — M8589 Other specified disorders of bone density and structure, multiple sites: Secondary | ICD-10-CM | POA: Diagnosis not present

## 2019-10-12 DIAGNOSIS — Z8679 Personal history of other diseases of the circulatory system: Secondary | ICD-10-CM

## 2019-10-12 DIAGNOSIS — M19071 Primary osteoarthritis, right ankle and foot: Secondary | ICD-10-CM | POA: Diagnosis not present

## 2019-10-12 DIAGNOSIS — M0579 Rheumatoid arthritis with rheumatoid factor of multiple sites without organ or systems involvement: Secondary | ICD-10-CM

## 2019-10-12 DIAGNOSIS — Z85828 Personal history of other malignant neoplasm of skin: Secondary | ICD-10-CM

## 2019-10-12 DIAGNOSIS — Z85820 Personal history of malignant melanoma of skin: Secondary | ICD-10-CM

## 2019-10-12 DIAGNOSIS — Z79899 Other long term (current) drug therapy: Secondary | ICD-10-CM | POA: Diagnosis not present

## 2019-10-12 DIAGNOSIS — Z8589 Personal history of malignant neoplasm of other organs and systems: Secondary | ICD-10-CM

## 2019-10-12 DIAGNOSIS — F419 Anxiety disorder, unspecified: Secondary | ICD-10-CM

## 2019-10-12 MED ORDER — HYDROXYCHLOROQUINE SULFATE 200 MG PO TABS: ORAL_TABLET | ORAL | 0 refills | Status: DC

## 2019-10-12 MED ORDER — LEFLUNOMIDE 20 MG PO TABS: 20.0000 mg | ORAL_TABLET | Freq: Every day | ORAL | 0 refills | Status: DC

## 2019-10-12 MED FILL — LEFLUNOMIDE 20 MG TABLET: 20 | 90 days supply | Qty: 90 | Fill #0

## 2019-10-12 NOTE — Patient Instructions (Addendum)
Standing Labs We placed an order today for your standing lab work.   Please have your standing labs drawn in September and every 3 months   If possible, please have your labs drawn 2 weeks prior to your appointment so that the provider can discuss your results at your appointment.  We have open lab daily Monday through Thursday from 8:30-12:30 PM and 1:30-4:30 PM and Friday from 8:30-12:30 PM and 1:30-4:00 PM at the office of Dr. Bo Merino, [facility].   Please be advised, patients with office appointments requiring lab work will take precedents over walk-in lab work.  If possible, please come for your lab work on Monday and Friday afternoons, as you may experience shorter wait times. The office is located at [address], [place], [place], [place] [address] No appointment is necessary.   Labs are drawn by [facility]. Please bring your co-pay at the time of your lab draw.  You may receive a bill from [facility] for your lab work.  If you wish to have your labs drawn at another location, please call the office 24 hours in advance to send orders.  If you have any questions regarding directions or hours of operation,  please call [phone].   As a reminder, please drink plenty of water prior to coming for your lab work. Thanks!   COVID-19 vaccine recommendations:   COVID-19 vaccine is recommended for everyone (unless you are allergic to a vaccine component), even if you are on a medication that suppresses your immune system.   If you are on Methotrexate, Cellcept (mycophenolate), Rinvoq, Morrie Sheldon, and Olumiant- hold the medication for 1 week after each vaccine. Hold Methotrexate for 2 weeks after the single dose COVID-19 vaccine.   If you are on Orencia subcutaneous injection - hold medication one week prior to and one week after the first COVID-19 vaccine dose (only).   If you are on Orencia IV infusions- time vaccination administration so that the first COVID-19  vaccination will occur four weeks after the infusion and postpone the subsequent infusion by one week.   If you are on Cyclophosphamide or Rituxan infusions please contact your doctor prior to receiving the COVID-19 vaccine.   Do not take Tylenol or ant anti-inflammatory medications (NSAIDs) 24 hours prior to the COVID-19 vaccination.   There is no direct evidence about the efficacy of the COVID-19 vaccine in individuals who are on medications that suppress the immune system.   Even if you are fully vaccinated, and you are on any medications that suppress your immune system, please continue to wear a mask, maintain at least six feet social distance and practice hand hygiene.   If you develop a COVID-19 infection, please contact your PCP or our office to determine if you need antibody infusion.  We anticipate that a booster vaccine will be available soon for immunosuppressed individuals. Please cal our office before receiving your booster dose to make adjustments to your medication regimen.      Hand Exercises Hand exercises can be helpful for almost anyone. These exercises can strengthen the hands, improve flexibility and movement, and increase blood flow to the hands. These results can make work and daily tasks easier. Hand exercises can be especially helpful for people who have joint pain from arthritis or have nerve damage from overuse (carpal tunnel syndrome). These exercises can also help people who have injured a hand. Exercises Most of these hand exercises are gentle stretching and motion exercises. It is usually safe to do them  often throughout the day. Warming up your hands before exercise may help to reduce stiffness. You can do this with gentle massage or by placing your hands in warm water for 10-15 minutes. It is normal to feel some stretching, pulling, tightness, or mild discomfort as you begin new exercises. This will gradually improve. Stop an exercise right away if you feel  sudden, severe pain or your pain gets worse. Ask your health care provider which exercises are best for you. Knuckle bend or "claw" fist 1. Stand or sit with your arm, hand, and all five fingers pointed straight up. Make sure to keep your wrist straight during the exercise. 2. Gently bend your fingers down toward your palm until the tips of your fingers are touching the top of your palm. Keep your big knuckle straight and just bend the small knuckles in your fingers. 3. Hold this position for __________ seconds. 4. Straighten (extend) your fingers back to the starting position. Repeat this exercise 5-10 times with each hand. Full finger fist 1. Stand or sit with your arm, hand, and all five fingers pointed straight up. Make sure to keep your wrist straight during the exercise. 2. Gently bend your fingers into your palm until the tips of your fingers are touching the middle of your palm. 3. Hold this position for __________ seconds. 4. Extend your fingers back to the starting position, stretching every joint fully. Repeat this exercise 5-10 times with each hand. Straight fist 1. Stand or sit with your arm, hand, and all five fingers pointed straight up. Make sure to keep your wrist straight during the exercise. 2. Gently bend your fingers at the big knuckle, where your fingers meet your hand, and the middle knuckle. Keep the knuckle at the tips of your fingers straight and try to touch the bottom of your palm. 3. Hold this position for __________ seconds. 4. Extend your fingers back to the starting position, stretching every joint fully. Repeat this exercise 5-10 times with each hand. Tabletop 1. Stand or sit with your arm, hand, and all five fingers pointed straight up. Make sure to keep your wrist straight during the exercise. 2. Gently bend your fingers at the big knuckle, where your fingers meet your hand, as far down as you can while keeping the small knuckles in your fingers straight. Think  of forming a tabletop with your fingers. 3. Hold this position for __________ seconds. 4. Extend your fingers back to the starting position, stretching every joint fully. Repeat this exercise 5-10 times with each hand. Finger spread 1. Place your hand flat on a table with your palm facing down. Make sure your wrist stays straight as you do this exercise. 2. Spread your fingers and thumb apart from each other as far as you can until you feel a gentle stretch. Hold this position for __________ seconds. 3. Bring your fingers and thumb tight together again. Hold this position for __________ seconds. Repeat this exercise 5-10 times with each hand. Making circles 1. Stand or sit with your arm, hand, and all five fingers pointed straight up. Make sure to keep your wrist straight during the exercise. 2. Make a circle by touching the tip of your thumb to the tip of your index finger. 3. Hold for __________ seconds. Then open your hand wide. 4. Repeat this motion with your thumb and each finger on your hand. Repeat this exercise 5-10 times with each hand. Thumb motion 1. Sit with your forearm resting on a table and  your wrist straight. Your thumb should be facing up toward the ceiling. Keep your fingers relaxed as you move your thumb. 2. Lift your thumb up as high as you can toward the ceiling. Hold for __________ seconds. 3. Bend your thumb across your palm as far as you can, reaching the tip of your thumb for the small finger (pinkie) side of your palm. Hold for __________ seconds. Repeat this exercise 5-10 times with each hand. Grip strengthening  1. Hold a stress ball or other soft ball in the middle of your hand. 2. Slowly increase the pressure, squeezing the ball as much as you can without causing pain. Think of bringing the tips of your fingers into the middle of your palm. All of your finger joints should bend when doing this exercise. 3. Hold your squeeze for __________ seconds, then  relax. Repeat this exercise 5-10 times with each hand. Contact a health care provider if:  Your hand pain or discomfort gets much worse when you do an exercise.  Your hand pain or discomfort does not improve within 2 hours after you exercise. If you have any of these problems, stop doing these exercises right away. Do not do them again unless your health care provider says that you can. Get help right away if:  You develop sudden, severe hand pain or swelling. If this happens, stop doing these exercises right away. Do not do them again unless your health care provider says that you can. This information is not intended to replace advice given to you by your health care provider. Make sure you discuss any questions you have with your health care provider. Document Revised: 06/19/2018 Document Reviewed: 02/27/2018 Elsevier Patient Education  [place].

## 2019-10-22 DIAGNOSIS — C44612 Basal cell carcinoma of skin of right upper limb, including shoulder: Secondary | ICD-10-CM | POA: Diagnosis not present

## 2019-10-22 DIAGNOSIS — L820 Inflamed seborrheic keratosis: Secondary | ICD-10-CM | POA: Diagnosis not present

## 2019-10-22 DIAGNOSIS — D2239 Melanocytic nevi of other parts of face: Secondary | ICD-10-CM | POA: Diagnosis not present

## 2019-10-22 DIAGNOSIS — L570 Actinic keratosis: Secondary | ICD-10-CM | POA: Diagnosis not present

## 2019-10-22 DIAGNOSIS — L738 Other specified follicular disorders: Secondary | ICD-10-CM | POA: Diagnosis not present

## 2019-10-22 DIAGNOSIS — D2261 Melanocytic nevi of right upper limb, including shoulder: Secondary | ICD-10-CM | POA: Diagnosis not present

## 2019-10-22 DIAGNOSIS — D0461 Carcinoma in situ of skin of right upper limb, including shoulder: Secondary | ICD-10-CM | POA: Diagnosis not present

## 2019-10-22 DIAGNOSIS — D485 Neoplasm of uncertain behavior of skin: Secondary | ICD-10-CM | POA: Diagnosis not present

## 2019-10-22 DIAGNOSIS — D2272 Melanocytic nevi of left lower limb, including hip: Secondary | ICD-10-CM | POA: Diagnosis not present

## 2019-10-22 DIAGNOSIS — Z85820 Personal history of malignant melanoma of skin: Secondary | ICD-10-CM | POA: Diagnosis not present

## 2019-10-22 DIAGNOSIS — Z85828 Personal history of other malignant neoplasm of skin: Secondary | ICD-10-CM | POA: Diagnosis not present

## 2019-10-22 DIAGNOSIS — C44319 Basal cell carcinoma of skin of other parts of face: Secondary | ICD-10-CM | POA: Diagnosis not present

## 2019-10-22 MED FILL — CLINDAMYCIN PHOSP 1% LOTION: 1 | 30 days supply | Qty: 60 | Fill #0

## 2019-10-22 MED FILL — FLUOROURACIL 5 % CREA: 5 | 14 days supply | Qty: 40 | Fill #0

## 2019-10-23 ENCOUNTER — Telehealth: Payer: Self-pay | Admitting: Rheumatology

## 2019-10-23 NOTE — Telephone Encounter (Signed)
Left message to advise patient to notify patient that the providers are not in the office till Monday. We will contact her on Monday. Also advised she will also need an appointment to start on biologic medication most likely Orencia.

## 2019-10-23 NOTE — Telephone Encounter (Signed)
Please notify patient that we are not in the office till Monday. We will contact her on Monday. She will also need an appointment to start on biologic medication most likely Orencia.

## 2019-10-23 NOTE — Telephone Encounter (Signed)
Patient left a voicemail stating she had an appointment with her dermatologist Dr. Ubaldo Glassing who gave her the okay to begin taking a Biologic medication.  Patient states he told her she needed to stay away from Enbrel and Humira.  Patient is requesting a return call to discuss the results of her x-rays as well as starting biologic medication.

## 2019-10-26 NOTE — Telephone Encounter (Signed)
Please call the patient to discuss X-ray results.  Right hand: Right 2nd MCP joint narrowing and erosive changes.  Progressive changes noted compared to x-rays from 2017.  Left hand revealed osteoarthritis and rheumatoid arthritis changes.  No erosive changes noted.  No progression compared to x-rays from 2017.  Erosive changes noted in both feet.  No comparison images were available.   Please schedule office visit to discuss biologic medication options.

## 2019-10-27 MED FILL — IMIQUIMOD 5% CREAM PACKET: 5 | 33 days supply | Qty: 24 | Fill #0

## 2019-10-27 NOTE — Telephone Encounter (Signed)
Spoke with patient and she states she was advised about x-rays already. Patient has been scheduled for an appointment to discuss treatment options on 11/11/2019.

## 2019-10-28 NOTE — Progress Notes (Signed)
Office Visit Note  Patient: Kara Campbell             Date of Birth: 13-May-1960           MRN: [id number]             PCP: Fanny Bien, MD Referring: Fanny Bien, MD Visit Date: 11/11/2019 Occupation: @GUAROCC @  Subjective:  Medication monitoring.   History of Present Illness: Kara Campbell is a 59 y.o. female with history of seropositive rheumatoid arthritis and osteoarthritis.  Patient is taking Plaquenil 200 mg 1 tablet by mouth twice daily Monday through Friday and Arava 20 mg 1 tablet by mouth daily.  She presents today to discussed different treatment options due to ongoing inflammation and radiographic progression on recent x-rays.  Patient states she had a visit with Dr. Ubaldo Glassing.  She advised to use other medications than anti-TNF's.  She continues to have some discomfort in her right second MCP joint with wrist is swollen despite being on Arava and Plaquenil.  She is also having pain and discomfort in her bilateral feet.  She states she has been wearing her tennis shoes and also metatarsal pads.  Activities of Daily Living:  Patient reports morning stiffness for 0  minutes.   Patient Reports nocturnal pain.  Difficulty dressing/grooming: Denies Difficulty climbing stairs: Denies Difficulty getting out of chair: Denies Difficulty using hands for taps, buttons, cutlery, and/or writing: Denies  Review of Systems  Constitutional: Negative for fatigue.  HENT: Negative for mouth sores, mouth dryness and nose dryness.   Eyes: Negative for pain, visual disturbance and dryness.  Respiratory: Negative for cough, hemoptysis, shortness of breath and difficulty breathing.   Cardiovascular: Negative for chest pain, palpitations, hypertension and swelling in legs/feet.  Gastrointestinal: Negative for blood in stool, constipation and diarrhea.  Endocrine: Negative for increased urination.  Genitourinary: Negative for painful urination.  Musculoskeletal: Positive for  arthralgias, joint pain and joint swelling. Negative for myalgias, muscle weakness, morning stiffness, muscle tenderness and myalgias.  Skin: Negative for color change, pallor, rash, hair loss, nodules/bumps, skin tightness, ulcers and sensitivity to sunlight.  Allergic/Immunologic: Negative for susceptible to infections.  Neurological: Negative for dizziness, numbness, headaches, memory loss and weakness.  Hematological: Negative for bruising/bleeding tendency and swollen glands.  Psychiatric/Behavioral: Negative for depressed mood and confusion. The patient is not nervous/anxious.     PMFS History:  Patient Active Problem List   Diagnosis Date Noted  . History of hypertension 10/16/2016  . Essential hypertension 09/11/2016  . Rheumatoid arthritis involving multiple sites with positive rheumatoid factor ([place]) 01/23/2016  . High risk medication use 01/23/2016  . Primary osteoarthritis of both hands 01/23/2016  . Primary osteoarthritis of both feet 01/23/2016  . Malignant melanoma ([place]) 01/23/2016  . Basal cell carcinoma 01/23/2016  . Anxiety 01/23/2016  . Chest congestion 06/07/2015    Past Medical History:  Diagnosis Date  . Arthritis   . Melanoma ([place]) rhe  . Rheumatoid arthritis(714.0)     Family History  Problem Relation Age of Onset  . Cancer Mother        breast  . Breast cancer Mother 48  . Rheum arthritis Other    Past Surgical History:  Procedure Laterality Date  . CESAREAN SECTION    . INJECTION KNEE Right    Fluid removed from knee x2   . MELANOMA EXCISION     Social History   Social History Narrative  . Not on file   Immunization History  Administered Date(s) Administered  . PFIZER SARS-COV-2 Vaccination 03/01/2019, 03/22/2019     Objective: Vital Signs: BP 129/86 (BP Location: Left Arm, Patient Position: Sitting, Cuff Size: Normal)   Pulse 65   Resp 14   Ht 5' 4.5" (1.638 m)   Wt 162 lb 9.6 oz (73.8 kg)   BMI 27.48 kg/m    Physical  Exam Vitals and nursing note reviewed.  Constitutional:      Appearance: She is well-developed.  HENT:     Head: Normocephalic and atraumatic.  Eyes:     Conjunctiva/sclera: Conjunctivae normal.  Cardiovascular:     Rate and Rhythm: Normal rate and regular rhythm.     Heart sounds: Normal heart sounds.  Pulmonary:     Effort: Pulmonary effort is normal.     Breath sounds: Normal breath sounds.  Abdominal:     General: Bowel sounds are normal.     Palpations: Abdomen is soft.  Musculoskeletal:     Cervical back: Normal range of motion.  Lymphadenopathy:     Cervical: No cervical adenopathy.  Skin:    General: Skin is warm and dry.     Capillary Refill: Capillary refill takes less than 2 seconds.  Neurological:     Mental Status: She is alert and oriented to person, place, and time.  Psychiatric:        Behavior: Behavior normal.      Musculoskeletal Exam: C-spine thoracic and lumbar spine with good range of motion.  Shoulder joints, elbow joints, wrist joints, MCPs PIPs and DIPs with good range of motion.  She had right second MCP synovitis.  Hip joints, knee joints, ankles with good range of motion.  She had bilateral pes cavus and tenderness across MTPs.  CDAI Exam: CDAI Score: 2.8  Patient Global: 4 mm; Provider Global: 4 mm Swollen: 1 ; Tender: 11  Joint Exam 11/11/2019      Right  Left  MCP 2  Swollen Tender     MTP 1   Tender   Tender  MTP 2   Tender   Tender  MTP 3   Tender   Tender  MTP 4   Tender   Tender  MTP 5   Tender   Tender     Investigation: No additional findings.  Imaging: XR Foot 2 Views Left  Result Date: 10/16/2019 PIP and DIP narrowing was noted.  Juxta-articular osteopenia was noted.  Cystic versus erosive changes were noted in the second, third and fourth MTP joints.  No intertarsal, tibiotalar or subtalar joint space narrowing was noted.  Inferior calcaneal spur was noted. Impression: These findings are consistent with rheumatoid arthritis  and osteoarthritis overlap.  XR Foot 2 Views Right  Result Date: 10/16/2019 Juxta-articular osteopenia was noted.  Erosive changes were noted in first MTP, second, third, fourth and fifth MTP joints.  PIP and DIP narrowing was noted.  No intertarsal, tibiotalar joint narrowing was noted.  Inferior calcaneal spur was noted. Impression: These findings are consistent with erosive rheumatoid arthritis and osteoarthritis overlap.  XR Hand 2 View Left  Result Date: 10/16/2019 Juxta-articular osteopenia was noted.  No MCP, intercarpal or radiocarpal joint space narrowing was noted.  No erosive changes were noted.  PIP and DIP narrowing was noted.  No radiographic progression was noted when compared to the x-rays of 2017. Impression: These findings are consistent with rheumatoid arthritis and osteoarthritis overlap.  XR Hand 2 View Right  Result Date: 10/16/2019 Juxta-articular osteopenia was noted.  Severe narrowing of  right second MCP joint with erosive changes were noted.  PIP and DIP narrowing was noted.  No intercarpal or radiocarpal joint space narrowing was noted.  Radiographic progression was noted when compared to 2017. Impression: These findings are consistent with rheumatoid arthritis and osteoarthritis overlap.  Radiographic progression was noted when compared to the x-rays of 2017.   Recent Labs: Lab Results  Component Value Date   WBC 5.9 09/09/2019   HGB 11.8 09/09/2019   PLT 276 09/09/2019   NA 139 09/09/2019   K 3.5 09/09/2019   CL 103 09/09/2019   CO2 27 09/09/2019   GLUCOSE 133 (H) 09/09/2019   BUN 13 09/09/2019   CREATININE 0.88 09/09/2019   BILITOT 0.7 09/09/2019   ALKPHOS 59 10/23/2016   AST 19 09/09/2019   ALT 14 09/09/2019   PROT 6.5 09/09/2019   ALBUMIN 4.2 10/23/2016   CALCIUM 9.4 09/09/2019   GFRAA 84 09/09/2019   QFTBGOLDPLUS NEGATIVE 08/26/2018    Speciality Comments: PLQ Eye exam: 04/13/19 WNL @ [facility] Follow up in 1 year  Procedures:  No  procedures performed Allergies: Amoxil [amoxicillin]   Assessment / Plan:     Visit Diagnoses: Rheumatoid arthritis involving multiple sites with positive rheumatoid factor ([place]) Erosive disease disease.  She had recent x-rays which showed erosive changes in the bilateral MTPs.  She also had right second MCP swelling and erosion.  Different treatment options and their side effects were discussed.  As she has history of melanoma and nonmelanoma skin cancer I think Orencia would be a good choice for her.  Indications side effects contraindications were discussed at length.  Handout was given and consent was taken.  We will apply for Orencia subcu.  Medication counseling:  TB Gold: Pending, 08/28/2018 Hepatitis panel: Pending HIV: 08/28/2018 SPEP: 02/01/2016 Immunoglobulins 02/01/2016  Does patient have a diagnosis of COPD? No  Counseled patient that Maureen Chatters is a selective T-cell costimulation blocker indicated for rheumatoid arthritis.  Counseled patient on purpose, proper use, and adverse effects of Orencia. The most common adverse effects are increased risk of infections, headache, and infusion reactions.  There is the possibility of an increased risk of malignancy but it is not well understood if this increased risk is due to the medication or the disease state.  Reviewed the importance of regular labs while on Orencia therapy.  Counseled patient that Maureen Chatters should be held prior to scheduled surgery.  Counseled patient to avoid live vaccines while on Orencia.  Advised patient to get annual influenza vaccine and the pneumococcal vaccine as indicated.  Provided patient with medication education material and answered all questions.  Patient consented to [facility].  Will upload consent into patient's chart.  Will submit benefit's investigation for Orencia.   High risk medication use - PLQ 200 mg 1 tablet by mouth BID M-F and Arava 20 mg 1 tablet by mouth daily.  Her labs have been stable.  She is  supposed to get labs every 3 months to monitor for drug toxicity.  I advised her to get COVID-19 vaccine as soon as possible as she will be starting Orencia soon.  Primary osteoarthritis of both hands-she has some stiffness due to osteoarthritis.  Primary osteoarthritis of both feet-she has severe osteoarthritis and rheumatoid arthritis in her feet with bilateral pes planus and hammertoes.  Feet exercises were demonstrated in the office.  Metatarsal pads were discussed.  Osteopenia of multiple sites-calcium vitamin D and exercise was emphasized.  History of basal cell cancer-she is  followed by Dr. Ubaldo Glassing.  History of melanoma  History of squamous cell carcinoma  History of hypertension  Anxiety  She had both COVID-19 vaccines.  Use of mask, social distancing and hand hygiene was emphasized.  She was also advised to get her third vaccine.  I also advised her that she will be a candidate for monoclonal antibody infusion in case she develops COVID-19 infection.  Instructions were placed in the AVS.  Orders: Orders Placed This Encounter  Procedures  . QuantiFERON-TB Gold Plus  . Hepatitis B core antibody, IgM  . Hepatitis B surface antigen  . Hepatitis C antibody   No orders of the defined types were placed in this encounter.    Follow-Up Instructions: Return in 6 weeks (on 12/23/2019) for Rheumatoid arthritis, Osteoarthritis.   Bo Merino, MD  Note - This record has been created using Editor, commissioning.  Chart creation errors have been sought, but may not always  have been located. Such creation errors do not reflect on  the standard of medical care.

## 2019-11-03 DIAGNOSIS — Z0000 Encounter for general adult medical examination without abnormal findings: Secondary | ICD-10-CM | POA: Diagnosis not present

## 2019-11-03 DIAGNOSIS — Z1159 Encounter for screening for other viral diseases: Secondary | ICD-10-CM | POA: Diagnosis not present

## 2019-11-11 ENCOUNTER — Telehealth: Payer: Self-pay | Admitting: Pharmacist

## 2019-11-11 ENCOUNTER — Ambulatory Visit: Payer: 59 | Admitting: Rheumatology

## 2019-11-11 ENCOUNTER — Encounter: Payer: Self-pay | Admitting: Physician Assistant

## 2019-11-11 ENCOUNTER — Other Ambulatory Visit: Payer: Self-pay

## 2019-11-11 VITALS — BP 129/86 | HR 65 | Resp 14 | Ht 64.5 in | Wt 162.6 lb

## 2019-11-11 DIAGNOSIS — M0579 Rheumatoid arthritis with rheumatoid factor of multiple sites without organ or systems involvement: Secondary | ICD-10-CM | POA: Diagnosis not present

## 2019-11-11 DIAGNOSIS — M19041 Primary osteoarthritis, right hand: Secondary | ICD-10-CM | POA: Diagnosis not present

## 2019-11-11 DIAGNOSIS — Z8679 Personal history of other diseases of the circulatory system: Secondary | ICD-10-CM

## 2019-11-11 DIAGNOSIS — M19071 Primary osteoarthritis, right ankle and foot: Secondary | ICD-10-CM

## 2019-11-11 DIAGNOSIS — Z85828 Personal history of other malignant neoplasm of skin: Secondary | ICD-10-CM | POA: Diagnosis not present

## 2019-11-11 DIAGNOSIS — Z85820 Personal history of malignant melanoma of skin: Secondary | ICD-10-CM | POA: Diagnosis not present

## 2019-11-11 DIAGNOSIS — M19072 Primary osteoarthritis, left ankle and foot: Secondary | ICD-10-CM

## 2019-11-11 DIAGNOSIS — M19042 Primary osteoarthritis, left hand: Secondary | ICD-10-CM

## 2019-11-11 DIAGNOSIS — F419 Anxiety disorder, unspecified: Secondary | ICD-10-CM

## 2019-11-11 DIAGNOSIS — Z8589 Personal history of malignant neoplasm of other organs and systems: Secondary | ICD-10-CM

## 2019-11-11 DIAGNOSIS — Z79899 Other long term (current) drug therapy: Secondary | ICD-10-CM | POA: Diagnosis not present

## 2019-11-11 DIAGNOSIS — M8589 Other specified disorders of bone density and structure, multiple sites: Secondary | ICD-10-CM

## 2019-11-11 MED FILL — IMIQUIMOD 5% CREAM PACKET: 5 | 30 days supply | Qty: 24 | Fill #0

## 2019-11-11 NOTE — Telephone Encounter (Signed)
Submitted a Prior Authorization request to [employer] for [facility] via Cover My Meds. Will update once we receive a response.   Key: [id number] - PA Case ID: [id number]

## 2019-11-11 NOTE — Patient Instructions (Signed)
Standing Labs We placed an order today for your standing lab work.   Please have your standing labs drawn in September  If possible, please have your labs drawn 2 weeks prior to your appointment so that the provider can discuss your results at your appointment.  We have open lab daily Monday through Thursday from 8:30-12:30 PM and 1:30-4:30 PM and Friday from 8:30-12:30 PM and 1:30-4:00 PM at the office of Dr. Bo Merino, [facility].   Please be advised, patients with office appointments requiring lab work will take precedents over walk-in lab work.  If possible, please come for your lab work on Monday and Friday afternoons, as you may experience shorter wait times. The office is located at [address] [place], [place], [place] [address] No appointment is necessary.   Labs are drawn by [facility]. Please bring your co-pay at the time of your lab draw.  You may receive a bill from [facility] for your lab work.  If you wish to have your labs drawn at another location, please call the office 24 hours in advance to send orders.  If you have any questions regarding directions or hours of operation,  please call [phone].   As a reminder, please drink plenty of water prior to coming for your lab work. Thanks!   Abatacept solution for injection (subcutaneous or intravenous use) What is this medicine? ABATACEPT (a ba TA sept) is used to treat moderate to severe active rheumatoid arthritis or psoriatic arthritis in adults. This medicine is also used to treat juvenile idiopathic arthritis. This medicine may be used for other purposes; ask your health care provider or pharmacist if you have questions. COMMON BRAND NAME(S): Orencia What should I tell my health care provider before I take this medicine? They need to know if you have any of these conditions:  cancer  diabetes  hepatitis B or history of hepatitis B infection  immune system problems  infection or history of  infection (especially a virus infection such as chickenpox, cold sores, or herpes)  lung or breathing problems, like chronic obstructive pulmonary disease (COPD)  recently received or scheduled to receive a vaccination  scheduled to have surgery  tuberculosis, a positive skin test for tuberculosis, or have recently been in close contact with someone who has tuberculosis  an unusual or allergic reaction to abatacept, other medicines, foods, dyes, or preservatives  pregnant or trying to get pregnant  breast-feeding How should I use this medicine? This medicine is for infusion into a vein or for injection under the skin. Infusions are given by a health care professional in a hospital or clinic setting. If you are to give your own medicine at home, you will be taught how to prepare and give this medicine under the skin. Use exactly as directed. Take your medicine at regular intervals. Do not take your medicine more often than directed. It is important that you put your used needles and syringes in a special sharps container. Do not put them in a trash can. If you do not have a sharps container, call your pharmacist or health care provider to get one. Talk to your pediatrician regarding the use of this medicine in children. While infusions in a clinic may be prescribed for children as young as 2 years for selected conditions, precautions do apply. Overdosage: If you think you have taken too much of this medicine contact a poison control center or emergency room at once. NOTE: This medicine is only for you. Do  not share this medicine with others. What if I miss a dose? This medicine is used once a week if given by injection under the skin. If you miss a dose, take it as soon as you can. If it is almost time for your next dose, take only that dose. Do not take double or extra doses. If you are to be given an infusion of this medicine, it is important not to miss your dose. Doses are usually every 4  weeks. Call your doctor or health care professional if you are unable to keep an appointment. What may interact with this medicine? Do not take this medicine with any of the following medications:  live vaccines This medicine may also interact with the following medications:  anakinra  baricitinib  canakinumab  medicines that lower your chance of fighting an infection  rituximab  TNF blockers such as adalimumab, certolizumab, etanercept, golimumab, infliximab  tocilizumab  tofacitinib  upadacitinib  ustekinumab This list may not describe all possible interactions. Give your health care provider a list of all the medicines, herbs, non-prescription drugs, or dietary supplements you use. Also tell them if you smoke, drink alcohol, or use illegal drugs. Some items may interact with your medicine. What should I watch for while using this medicine? Visit your doctor for regular checks on your progress. Tell your doctor or health care professional if your symptoms do not start to get better or if they get worse. You will be tested for tuberculosis (TB) before you start this medicine. If your doctor prescribed any medicine for TB, you should start taking the TB medicine before starting this medicine. Make sure to finish the full course of TB medicine. This medicine may increase your risk of getting an infection. Call your doctor or health care professional if you get fever, chills, or sore throat, or other symptoms of a cold or flu. Do not treat yourself. Try to avoid being around people who are sick. If you have diabetes and are getting this medicine in a vein, the infusion can give false high blood sugar readings on the day of your dose. This may happen if you use certain types of blood glucose tests. Your health care provider may tell you to use a different way to monitor your blood sugar levels. What side effects may I notice from receiving this medicine? Side effects that you should  report to your doctor or health care professional as soon as possible:  allergic reactions like skin rash, itching or hives, swelling of the face, lips, or tongue  breathing problems  chest pain  dizziness  signs and symptoms of infection like fever; chills; cough; sore throat; pain or trouble passing urine  unusually weak or tired Side effects that usually do not require medical attention (report to your doctor or health care professional if they continue or are bothersome):  diarrhea  headache  nausea  pain, redness, or irritation at site where injected  stomach pain or upset This list may not describe all possible side effects. Call your doctor for medical advice about side effects. You may report side effects to FDA at 1-800-FDA-1088. Where should I keep my medicine? Infusions will be given in a hospital or clinic and will not be stored at home. Storage for syringes and autoinjectors stored at home: Keep out of the reach of children. Store in a refrigerator between 2 and 8 degrees C (36 and 46 degrees F). Keep this medicine in the original container. Protect from light. Do  not freeze. Do not shake. Throw away any unused medicine after the expiration date. NOTE: This sheet is a summary. It may not cover all possible information. If you have questions about this medicine, talk to your doctor, pharmacist, or health care provider.  2020 Elsevier/Gold Standard (2018-09-02 14:01:21)  COVID-19 vaccine recommendations: COVID-19 vaccine is recommended for everyone (unless you are allergic to a vaccine component), even if you are on a medication that suppresses your immune system.   If you are on Methotrexate, Cellcept (mycophenolate), Rinvoq, Morrie Sheldon, and Olumiant- hold the medication for 1 week after each vaccine. Hold Methotrexate for 2 weeks after the single dose COVID-19 vaccine.   If you are on Orencia subcutaneous injection - hold medication one week prior to and one week after  the first COVID-19 vaccine dose (only).   If you are on Orencia IV infusions- time vaccination administration so that the first COVID-19 vaccination will occur four weeks after the infusion and postpone the subsequent infusion by one week.   If you are on Cyclophosphamide or Rituxan infusions please contact your doctor prior to receiving the COVID-19 vaccine.   Do not take Tylenol or any anti-inflammatory medications (NSAIDs) 24 hours prior to the COVID-19 vaccination.   There is no direct evidence about the efficacy of the COVID-19 vaccine in individuals who are on medications that suppress the immune system.   Even if you are fully vaccinated, and you are on any medications that suppress your immune system, please continue to wear a mask, maintain at least six feet social distance and practice hand hygiene.   If you develop a COVID-19 infection, please contact your PCP or our office to determine if you need antibody infusion.  The booster vaccine is now available for immunocompromised patients. It is advised that if you had Pfizer vaccine you should get [employer] booster.  If you had a Moderna vaccine then you should get a Moderna booster. Johnson and Wynetta Emery does not have a booster vaccine at this time.  Please see the following web sites for updated information.   [email or web address]://[email or web address]/[email or web address]-Vaccination-Patient-Resources.[email or web address]  [email or web address]://[email or web address]/About-Us/[email or web address]-Releases/ID/1159

## 2019-11-11 NOTE — Telephone Encounter (Signed)
Please start benefits investigation for Orencia for RA.  Patient has tried and failed Plaquenil and Arava.  She has a history of melanoma and non-melanoma skin caner and is not a candidate for anti-TNF's.   Mariella Saa, PharmD, [place], CPP Clinical Specialty Pharmacist (Rheumatology and Pulmonology)  11/11/2019 10:57 AM

## 2019-11-13 ENCOUNTER — Telehealth: Payer: Self-pay | Admitting: *Deleted

## 2019-11-13 LAB — QUANTIFERON-TB GOLD PLUS
Mitogen-NIL: >10 IU/mL
NIL: 0.02 IU/mL
QuantiFERON-TB Gold Plus: NEGATIVE
TB1-NIL: 0 IU/mL
TB2-NIL: 0.01 IU/mL

## 2019-11-13 LAB — HEPATITIS C ANTIBODY
Hepatitis C Ab: NONREACTIVE
SIGNAL TO CUT-OFF: 0.01 (ref <1.00)

## 2019-11-13 LAB — HEPATITIS B CORE ANTIBODY, IGM: Hep B C IgM: NONREACTIVE

## 2019-11-13 LAB — HEPATITIS B SURFACE ANTIGEN: Hepatitis B Surface Ag: NONREACTIVE

## 2019-11-13 NOTE — Telephone Encounter (Signed)
This patient has history of melanoma and nonmelanoma skin cancer.  She does not want to take any medication which can increase risk of cancer.  JAKi increase risk of cancer.

## 2019-11-13 NOTE — Telephone Encounter (Signed)
Received a fax regarding Prior Authorization from [facility] for [facility]. Authorization has been DENIED because patient must have previously tried any TWO of the following preferred immunomodulators (class of drugs), unless there is a medical reason why you cannot (contraindication): Enbrel, Humira, Rinvoq, Xeljanz (immediate release/extended release). Your doctor told us you would like Orencia covered under your benefit however we do not have information you have tried any of the listed immunomodulators mentioned above.

## 2019-11-13 NOTE — Telephone Encounter (Signed)
Insurance will not cover Orencia unless she has Rinvoq or [place].  Looking through her chart I do not see any contraindications to JAK inhibitors.

## 2019-11-13 NOTE — Telephone Encounter (Signed)
Received labs from Dr. Benjamine Mola Dewy Reviewed by Hazel Sams, PA-C [place] on 11/03/2019  CBC/ CMP/ Lipid panel WNL

## 2019-11-24 DIAGNOSIS — Z23 Encounter for immunization: Secondary | ICD-10-CM | POA: Diagnosis not present

## 2019-11-24 DIAGNOSIS — Z1211 Encounter for screening for malignant neoplasm of colon: Secondary | ICD-10-CM | POA: Diagnosis not present

## 2019-11-24 DIAGNOSIS — Z0000 Encounter for general adult medical examination without abnormal findings: Secondary | ICD-10-CM | POA: Diagnosis not present

## 2019-11-24 NOTE — Telephone Encounter (Signed)
Patient called to let the office know she had her COVID booster vaccine yesterday with Dr. Ernie Hew.

## 2019-12-01 ENCOUNTER — Encounter: Payer: Self-pay | Admitting: Pharmacist

## 2019-12-01 NOTE — Telephone Encounter (Signed)
Appeal faxed to [place].  Will update when we receive a response.   Fax #  [phone]  Mariella Saa, PharmD, BCACP, CPP Clinical Specialty Pharmacist (Rheumatology and Pulmonology)  12/01/2019 8:50 AM

## 2019-12-10 MED FILL — HYDROXYCHLOROQUINE 200 MG T: 200 | 84 days supply | Qty: 120 | Fill #0

## 2019-12-15 NOTE — Telephone Encounter (Addendum)
Spoke with patient and advised she has been approved for [employer]. Patient has been scheduled for a new start on 12/18/2019 at 11:00 am with Hazel Sams, PA-C. Patient advised to sign up for the Orencia copay card online. Patient expressed understanding.

## 2019-12-15 NOTE — Telephone Encounter (Signed)
Received notification from [facility] regarding a Appeal for [employer]. Authorization has been APPROVED from 12/14/19 to 06/12/20.   Authorization # [id number]  Patient must fill through [facility], and she can sign up for Orencia copay card.

## 2019-12-18 ENCOUNTER — Ambulatory Visit: Payer: 59 | Admitting: Physician Assistant

## 2019-12-18 ENCOUNTER — Other Ambulatory Visit: Payer: Self-pay

## 2019-12-18 ENCOUNTER — Encounter: Payer: Self-pay | Admitting: Physician Assistant

## 2019-12-18 VITALS — BP 125/84 | HR 61

## 2019-12-18 DIAGNOSIS — M0579 Rheumatoid arthritis with rheumatoid factor of multiple sites without organ or systems involvement: Secondary | ICD-10-CM | POA: Diagnosis not present

## 2019-12-18 DIAGNOSIS — Z79899 Other long term (current) drug therapy: Secondary | ICD-10-CM

## 2019-12-18 NOTE — Progress Notes (Signed)
Pharmacy Note  Subjective:   Patient presents to clinic today to receive the first Orencia ClickJect 115 mg sq injection.   Informational handout was reviewed today in the office.  Discussed the importance of holding Orencia if she develops signs or symptoms of an infection and to resume once the infection has cleared.  She has received both covid-19 vaccine doses.  She is planning on having her annual influenza vaccine through work.   Patient running a fever or have signs/symptoms of infection? No  Patient currently on antibiotics for the treatment of infection? No  Patient have any upcoming invasive procedures/surgeries? No  Objective: CMP     Component Value Date/Time   NA 139 09/09/2019 1443   K 3.5 09/09/2019 1443   CL 103 09/09/2019 1443   CO2 27 09/09/2019 1443   GLUCOSE 133 (H) 09/09/2019 1443   BUN 13 09/09/2019 1443   CREATININE 0.88 09/09/2019 1443   CALCIUM 9.4 09/09/2019 1443   PROT 6.5 09/09/2019 1443   ALBUMIN 4.2 10/23/2016 0905   AST 19 09/09/2019 1443   ALT 14 09/09/2019 1443   ALKPHOS 59 10/23/2016 0905   BILITOT 0.7 09/09/2019 1443   GFRNONAA 72 09/09/2019 1443   GFRAA 84 09/09/2019 1443    CBC    Component Value Date/Time   WBC 5.9 09/09/2019 1443   RBC 3.97 09/09/2019 1443   HGB 11.8 09/09/2019 1443   HCT 36.1 09/09/2019 1443   PLT 276 09/09/2019 1443   MCV 90.9 09/09/2019 1443   MCH 29.7 09/09/2019 1443   MCHC 32.7 09/09/2019 1443   RDW 12.1 09/09/2019 1443   LYMPHSABS 1,339 09/09/2019 1443   MONOABS 392 10/23/2016 0905   EOSABS 307 09/09/2019 1443   BASOSABS 71 09/09/2019 1443    Baseline Immunosuppressant Therapy Labs TB GOLD Quantiferon TB Gold Latest Ref Rng & Units 11/11/2019  Quantiferon TB Gold Plus NEGATIVE NEGATIVE   Hepatitis Panel Hepatitis Latest Ref Rng & Units 11/11/2019  Hep B Surface Ag NON-REACTI NON-REACTIVE  Hep B IgM NON-REACTI NON-REACTIVE  Hep C Ab NON-REACTI NON-REACTIVE  Hep C Ab NON-REACTI NON-REACTIVE    HIV Lab Results  Component Value Date   HIV NON-REACTIVE 08/26/2018   Immunoglobulins Immunoglobulin Electrophoresis Latest Ref Rng & Units 01/25/2016  IgG 694 - 1,618 mg/dL 1,085  IgM 48 - 271 mg/dL 160   SPEP Serum Protein Electrophoresis Latest Ref Rng & Units 09/09/2019  Total Protein 6.1 - 8.1 g/dL 6.5  Albumin 3.8 - 4.8 g/dL -  Alpha-1 0.2 - 0.3 g/dL -  Alpha-2 0.5 - 0.9 g/dL -  Beta Globulin 0.4 - 0.6 g/dL -  Beta 2 0.2 - 0.5 g/dL -  Gamma Globulin 0.8 - 1.7 g/dL -  Interpretation - -   G6PD Lab Results  Component Value Date   G6PDH 11.6 08/26/2018   TPMT No results found for: TPMT   Chest x-ray: 06/08/12  Assessment/Plan:  Demonstrated proper injection technique with Orencia Clickject demo device  Patient able to demonstrate proper injection technique using the teach back method.  Patient self injected in the right thigh at 11:36 am with:  Sample Medication: Orencia ClickJect 726 mg/ml single dose autoinjector  GTIN: [id number] S/N: [id number] Lot: [id number] Expiration: February 2023   Patient tolerated well.  Observed for 30 mins in office for adverse reaction.  No injection site or adverse reactions noted.  She tolerated the injection and felt comfortable using the device.     Patient is to return in 1 month  for labs and 6-8 weeks for follow-up appointment.  Standing orders placed.   Orencia Clickject approved through insurance. She will call to obtain copay card.  Prescription sent to [facility]   All questions encouraged and answered.  Instructed patient to call with any further questions or concerns.  Hazel Sams, PA-C   12/18/2019 2:27 PM

## 2019-12-18 NOTE — Patient Instructions (Signed)
Standing Labs We placed an order today for your standing lab work.   Please have your standing labs drawn in 1 month then every 3 months   If possible, please have your labs drawn 2 weeks prior to your appointment so that the provider can discuss your results at your appointment.  We have open lab daily Monday through Thursday from 8:30-12:30 PM and 1:30-4:30 PM and Friday from 8:30-12:30 PM and 1:30-4:00 PM at the office of Dr. Bo Merino, [facility].   Please be advised, patients with office appointments requiring lab work will take precedents over walk-in lab work.  If possible, please come for your lab work on Monday and Friday afternoons, as you may experience shorter wait times. The office is located at [address], [place], [place], [place] [address] No appointment is necessary.   Labs are drawn by [facility]. Please bring your co-pay at the time of your lab draw.  You may receive a bill from [facility] for your lab work.  If you wish to have your labs drawn at another location, please call the office 24 hours in advance to send orders.  If you have any questions regarding directions or hours of operation,  please call [phone].   As a reminder, please drink plenty of water prior to coming for your lab work. Thanks!  COVID-19 vaccine recommendations:   COVID-19 vaccine is recommended for everyone (unless you are allergic to a vaccine component), even if you are on a medication that suppresses your immune system.   If you are on Methotrexate, Cellcept (mycophenolate), Rinvoq, Morrie Sheldon, and Olumiant- hold the medication for 1 week after each vaccine. Hold Methotrexate for 2 weeks after the single dose COVID-19 vaccine.   If you are on Orencia subcutaneous injection - hold medication one week prior to and one week after the first COVID-19 vaccine dose (only).   If you are on Orencia IV infusions- time vaccination administration so that the first COVID-19  vaccination will occur four weeks after the infusion and postpone the subsequent infusion by one week.   If you are on Cyclophosphamide or Rituxan infusions please contact your doctor prior to receiving the COVID-19 vaccine.   Do not take Tylenol or any anti-inflammatory medications (NSAIDs) 24 hours prior to the COVID-19 vaccination.   There is no direct evidence about the efficacy of the COVID-19 vaccine in individuals who are on medications that suppress the immune system.   Even if you are fully vaccinated, and you are on any medications that suppress your immune system, please continue to wear a mask, maintain at least six feet social distance and practice hand hygiene.   If you develop a COVID-19 infection, please contact your PCP or our office to determine if you need antibody infusion.  The booster vaccine is now available for immunocompromised patients. It is advised that if you had Pfizer vaccine you should get Coca[employer]a booster.  If you had a Moderna vaccine then you should get a Moderna booster. Johnson and Wynetta Emery does not have a booster vaccine at this time.  Please see the following web sites for updated information.   [email or web address]://[email or web address]/[email or web address]-Vaccination-Patient-Resources.[email or web address]  [email or web address]://[email or web address]/About-Us/[email or web address]-Releases/ID/1159

## 2019-12-22 ENCOUNTER — Other Ambulatory Visit: Payer: Self-pay | Admitting: Physician Assistant

## 2019-12-22 ENCOUNTER — Other Ambulatory Visit (HOSPITAL_COMMUNITY): Payer: Self-pay | Admitting: Family Medicine

## 2019-12-22 MED FILL — VALSARTAN-HCTZ 160-25 MG TA: 160-25 | 90 days supply | Qty: 90 | Fill #0

## 2019-12-22 MED FILL — PARoxetine HCL 20 MG TABS: 20 | 90 days supply | Qty: 90 | Fill #1

## 2019-12-22 MED FILL — MONTELUKAST SOD 10 MG TAB: 10 | 90 days supply | Qty: 90 | Fill #1

## 2019-12-22 NOTE — Telephone Encounter (Signed)
Last Visit: 11/11/2019 Next Visit: 02/09/2020 Labs: 11/03/2019 CBC/CMP WNL   Current Dose per office note on 11/11/2019: Arava 20 mg 1 tablet by mouth daily. DX: Rheumatoid arthritis involving multiple sites with positive rheumatoid factor Patient started Orencia on 12/18/2019.   Okay to refill Arava?

## 2019-12-23 ENCOUNTER — Other Ambulatory Visit: Payer: Self-pay

## 2019-12-23 ENCOUNTER — Ambulatory Visit (HOSPITAL_BASED_OUTPATIENT_CLINIC_OR_DEPARTMENT_OTHER): Payer: 59 | Admitting: Pharmacist

## 2019-12-23 ENCOUNTER — Other Ambulatory Visit: Payer: Self-pay | Admitting: Internal Medicine

## 2019-12-23 DIAGNOSIS — Z79899 Other long term (current) drug therapy: Secondary | ICD-10-CM

## 2019-12-23 MED ORDER — ORENCIA CLICKJECT 125 MG/ML ~~LOC~~ SOAJ: 125.0000 mg | SUBCUTANEOUS | 0 refills | Status: DC

## 2019-12-23 MED FILL — ORENCIA CLICKJECT 125 MG/ML: 125 | 28 days supply | Qty: 4 | Fill #0

## 2019-12-23 NOTE — Progress Notes (Signed)
° °  S: Patient presents for review of their specialty medication therapy.  Patient is currently taking Orencia for RA. Patient is managed by Dr. Estanislado Pandy for this.   Adherence: first injection taken last Friday (12/18/19).  FDA-approved dosing: SubQ: 125 mg subQ once weekly.   Dose adjustments: Renal impairment: none Hepatic impairment: none Toxicity: discontinue if serious infection develops  Drug-drug interactions: none identified   Screenings: TB screening: completed Hepatitis Screening: completed Blood glucose: Orencia contains maltose which make falsely elevate glucose levels  Monitoring: S/sx of infection: none currently  S/sx of hypersensitivity: none currently  Other adverse effects: none   O:     Lab Results  Component Value Date   WBC 5.9 09/09/2019   HGB 11.8 09/09/2019   HCT 36.1 09/09/2019   MCV 90.9 09/09/2019   PLT 276 09/09/2019      Chemistry      Component Value Date/Time   NA 139 09/09/2019 1443   K 3.5 09/09/2019 1443   CL 103 09/09/2019 1443   CO2 27 09/09/2019 1443   BUN 13 09/09/2019 1443   CREATININE 0.88 09/09/2019 1443      Component Value Date/Time   CALCIUM 9.4 09/09/2019 1443   ALKPHOS 59 10/23/2016 0905   AST 19 09/09/2019 1443   ALT 14 09/09/2019 1443   BILITOT 0.7 09/09/2019 1443       A/P: 1. Medication review: Patient currently on [place] for the treatment of rheumatoid arthritis and is tolerating it well. Reviewed the medication with the patient, including the following: Orencia is a selective T-cell costimulation blocker indicated for rheumatoid arthritis. Patient educated on purpose, proper use and potential adverse effects of Orencia. The most common adverse effects are infections, headache, and injection site reactions. There is a possible adverse effect of increased risk of malignancy but it is not fully understood if this is due to the drug or the disease state itself. The patient was instructed to avoid use of live  vaccinations without the approval of a physician. IV: Infuse over 30 minutes. Administer through a 0.2 to 1.2 micron low protein-binding filter. SubQ: Allow prefilled syringe and autoinjector to warm to room temperature (for 30 to 60 minutes and 30 minutes, respectively) prior to administration. Inject into the front of the thigh (preferred), abdomen (except for 2-inch area around the navel), or the outer area of the upper arms (if administered by a caregiver). Rotate injection sites (?1 inch apart); do not administer into tender, bruised, red, or hard skin. No recommendations for any changes.  Benard Halsted, PharmD, [place] [phone]

## 2019-12-23 NOTE — Telephone Encounter (Signed)
Patient had her new start visit on 12/18/2019.

## 2019-12-23 NOTE — Telephone Encounter (Signed)
Patient called stating she received her co-pay card and requesting the prescription of Orencia to be sent to [facility].

## 2020-01-14 ENCOUNTER — Other Ambulatory Visit: Payer: Self-pay | Admitting: *Deleted

## 2020-01-14 DIAGNOSIS — Z79899 Other long term (current) drug therapy: Secondary | ICD-10-CM | POA: Diagnosis not present

## 2020-01-15 LAB — COMPLETE METABOLIC PANEL WITH GFR
AG Ratio: 2 (calc) (ref 1.0–2.5)
ALT: 19 U/L (ref 6–29)
AST: 24 U/L (ref 10–35)
Albumin: 4.6 g/dL (ref 3.6–5.1)
Alkaline phosphatase (APISO): 56 U/L (ref 37–153)
BUN: 10 mg/dL (ref 7–25)
CO2: 28 mmol/L (ref 20–32)
Calcium: 9.8 mg/dL (ref 8.6–10.4)
Chloride: 100 mmol/L (ref 98–110)
Creat: 0.68 mg/dL (ref 0.50–1.05)
GFR, Est African American: 111 mL/min/{1.73_m2} (ref >=60)
GFR, Est Non African American: 96 mL/min/{1.73_m2} (ref >=60)
Globulin: 2.3 g/dL (calc) (ref 1.9–3.7)
Glucose, Bld: 87 mg/dL (ref 65–139)
Potassium: 4.1 mmol/L (ref 3.5–5.3)
Sodium: 138 mmol/L (ref 135–146)
Total Bilirubin: 0.8 mg/dL (ref 0.2–1.2)
Total Protein: 6.9 g/dL (ref 6.1–8.1)

## 2020-01-15 LAB — CBC WITH DIFFERENTIAL/PLATELET
Absolute Monocytes: 715 cells/uL (ref 200–950)
Basophils Absolute: 60 cells/uL (ref 0–200)
Basophils Relative: 1.2 %
Eosinophils Absolute: 200 cells/uL (ref 15–500)
Eosinophils Relative: 4 %
HCT: 39.9 % (ref 35.0–45.0)
Hemoglobin: 13.2 g/dL (ref 11.7–15.5)
Lymphs Abs: 1220 cells/uL (ref 850–3900)
MCH: 30.5 pg (ref 27.0–33.0)
MCHC: 33.1 g/dL (ref 32.0–36.0)
MCV: 92.1 fL (ref 80.0–100.0)
MPV: 9.9 fL (ref 7.5–12.5)
Monocytes Relative: 14.3 %
Neutro Abs: 2805 cells/uL (ref 1500–7800)
Neutrophils Relative %: 56.1 %
Platelets: 292 10*3/uL (ref 140–400)
RBC: 4.33 10*6/uL (ref 3.80–5.10)
RDW: 12 % (ref 11.0–15.0)
Total Lymphocyte: 24.4 %
WBC: 5 10*3/uL (ref 3.8–10.8)

## 2020-01-15 NOTE — Progress Notes (Signed)
CBC and CMP are normal.

## 2020-01-21 MED FILL — LEFLUNOMIDE 20 MG TABLET: 20 | 90 days supply | Qty: 90 | Fill #0

## 2020-01-21 MED FILL — ORENCIA CLICKJECT 125 MG/ML: 125 | 28 days supply | Qty: 4 | Fill #1

## 2020-01-25 ENCOUNTER — Telehealth: Payer: Self-pay

## 2020-01-25 NOTE — Telephone Encounter (Signed)
Left message to advise patient a 90 day supply was sent on 12/22/2019. Patient advised to contact the office if she had any issues with the prescription.

## 2020-01-25 NOTE — Telephone Encounter (Signed)
Patient called requesting prescription refill of Leflunomide to be sent to [facility].

## 2020-01-26 NOTE — Progress Notes (Signed)
Office Visit Note  Patient: Kara Campbell             Date of Birth: Jan 22, 1961           MRN: [id number]             PCP: Fanny Bien, MD Referring: Fanny Bien, MD Visit Date: 02/09/2020 Occupation: @GUAROCC @  Subjective:  Medication monitoring   History of Present Illness: Kara Campbell is a 59 y.o. female  With history of seropositive rheumatoid arthritis and osteoarthritis. She is on orencia 125 mg sq injections every week (started on 12/18/19), PLQ 200 mg BID M-F, and arava 20 mg daily.  She has noticed clinical improvement on triple therapy.  She continues have intermittent pain and swelling in her right second MCP joint but overall her symptoms have started to improve.  Her discomfort is typically aggravated by typing for prolonged periods of time at work.  She denies any other joint pain or joint swelling at this time.  She denies any morning stiffness or difficulty with ADLs.   Activities of Daily Living:  Patient reports morning stiffness for  0 minutes.   Patient Denies nocturnal pain.  Difficulty dressing/grooming: Denies Difficulty climbing stairs: Denies Difficulty getting out of chair: Denies Difficulty using hands for taps, buttons, cutlery, and/or writing: Denies  Review of Systems  Constitutional: Negative for fatigue.  HENT: Negative for mouth sores, mouth dryness and nose dryness.   Eyes: Negative for pain, itching and dryness.  Respiratory: Negative for shortness of breath and difficulty breathing.   Cardiovascular: Negative for chest pain and palpitations.  Gastrointestinal: Negative for blood in stool, constipation and diarrhea.  Endocrine: Negative for increased urination.  Genitourinary: Negative for difficulty urinating.  Musculoskeletal: Positive for joint swelling. Negative for arthralgias, joint pain, myalgias, morning stiffness, muscle tenderness and myalgias.  Skin: Negative for color change, rash and redness.  Allergic/Immunologic:  Negative for susceptible to infections.  Neurological: Negative for dizziness, numbness, headaches, memory loss and weakness.  Hematological: Negative for bruising/bleeding tendency.  Psychiatric/Behavioral: Negative for confusion.    PMFS History:  Patient Active Problem List   Diagnosis Date Noted  . History of hypertension 10/16/2016  . Essential hypertension 09/11/2016  . Rheumatoid arthritis involving multiple sites with positive rheumatoid factor ([place]) 01/23/2016  . High risk medication use 01/23/2016  . Primary osteoarthritis of both hands 01/23/2016  . Primary osteoarthritis of both feet 01/23/2016  . Malignant melanoma ([place]) 01/23/2016  . Basal cell carcinoma 01/23/2016  . Anxiety 01/23/2016  . Chest congestion 06/07/2015    Past Medical History:  Diagnosis Date  . Arthritis   . Melanoma ([place]) rhe  . Rheumatoid arthritis(714.0)     Family History  Problem Relation Age of Onset  . Cancer Mother        breast  . Breast cancer Mother 65  . Rheum arthritis Other    Past Surgical History:  Procedure Laterality Date  . CESAREAN SECTION    . INJECTION KNEE Right    Fluid removed from knee x2   . MELANOMA EXCISION     Social History   Social History Narrative  . Not on file   Immunization History  Administered Date(s) Administered  . PFIZER SARS-COV-2 Vaccination 03/01/2019, 03/22/2019, 11/24/2019     Objective: Vital Signs: BP 132/87 (BP Location: Right Arm, Patient Position: Sitting, Cuff Size: Normal)   Pulse 65   Resp 15   Ht 5\' 5"  (1.651 m)   Wt 165  lb (74.8 kg)   BMI 27.46 kg/m    Physical Exam Vitals and nursing note reviewed.  Constitutional:      Appearance: She is well-developed.  HENT:     Head: Normocephalic and atraumatic.  Eyes:     Conjunctiva/sclera: Conjunctivae normal.  Pulmonary:     Effort: Pulmonary effort is normal.  Abdominal:     Palpations: Abdomen is soft.  Musculoskeletal:     Cervical back: Normal range of motion.    Skin:    General: Skin is warm and dry.     Capillary Refill: Capillary refill takes less than 2 seconds.  Neurological:     Mental Status: She is alert and oriented to person, place, and time.  Psychiatric:        Behavior: Behavior normal.      Musculoskeletal Exam: C-spine, thoracic spine, and lumbar spine good ROM.  No midline spinal tenderness.  No SI joint tenderness.  Shoulder joints, elbow joints, and wrist joints good ROM with no discomfort.  Synovial thickening and mild synovitis of the right 2nd MCP joint.  No other tenderness or synovitis of MCP joints at this time.  Complete fist formation bilaterally.  Hip joints good ROM with no discomfort.  Knee joints good ROM with no warmth or effusion.  Ankle joints good ROM with no tenderness or inflammation.  Pes cavus noted bilaterally.  Hammertoes noted.  Thickening of bilateral 1st MTP joints noted.   CDAI Exam: CDAI Score: 0.2  Patient Global: 1 mm; Provider Global: 1 mm Swollen: 0 ; Tender: 0  Joint Exam 02/09/2020   No joint exam has been documented for this visit   There is currently no information documented on the homunculus. Go to the Rheumatology activity and complete the homunculus joint exam.  Investigation: No additional findings.  Imaging: No results found.  Recent Labs: Lab Results  Component Value Date   WBC 5.0 01/14/2020   HGB 13.2 01/14/2020   PLT 292 01/14/2020   NA 138 01/14/2020   K 4.1 01/14/2020   CL 100 01/14/2020   CO2 28 01/14/2020   GLUCOSE 87 01/14/2020   BUN 10 01/14/2020   CREATININE 0.68 01/14/2020   BILITOT 0.8 01/14/2020   ALKPHOS 59 10/23/2016   AST 24 01/14/2020   ALT 19 01/14/2020   PROT 6.9 01/14/2020   ALBUMIN 4.2 10/23/2016   CALCIUM 9.8 01/14/2020   GFRAA 111 01/14/2020   QFTBGOLDPLUS NEGATIVE 11/11/2019    Speciality Comments: PLQ Eye exam: 04/13/19 WNL @ [facility] Follow up in 1 year  Procedures:  No procedures performed Allergies: Amoxil [amoxicillin]    Assessment / Plan:     Visit Diagnoses: Rheumatoid arthritis involving multiple sites with positive rheumatoid factor ([place]) Erosive disease - Erosive disease: She has synovial thickening and mild synovitis of the right second MCP joint.  She has noticed clinical improvement since starting on Orencia on 12/18/2019.  She is currently on Orencia 125 mg subcutaneous injections once weekly, Plaquenil 200 mg 1 tablet by mouth twice daily Monday through Friday, and Arava 20 mg 1 tablet by mouth daily.  She is tolerating these medications without any side effects.  She experiences occasional discomfort and stiffness in both hands especially her right hand when typing for prolonged periods of time at work.  She has no other joint pain or inflammation at this time.  She has not experienced any morning stiffness, difficulty with ADLs, or nocturnal pain.  She will continue on the current  treatment regimen.  We discussed that if she continues to notice clinical improvement we will discuss discontinuing Plaquenil in the future.  She will follow-up in the office in 3 months to assess her full response to triple therapy.   High risk medication use - Orencia 125 mg sq injections once weekly (started on 12/18/19), arava 20 mg 1 tablet by mouth daily, and plaquenil 200 mg 1 tablet by mouth twice daily M-F. CBC and CMP WNL on 01/14/20.  She will be due to update lab work in February and every 3 months.  Standing orders for CBC and CMP remain in place. TB gold negative on 11/11/19 and will continue to be monitored yearly. PLQ Eye exam: 04/13/19 WNL @ [facility] Follow up in 1 year.  She was given a Plaquenil eye exam form to take with her to her upcoming appointment. She has received all 3 covid-19 vaccine doses.  She has not had any recent infections.  We discussed the importance of holding [place] if she develops signs or symptoms of an infection and to resume once the infection has completely cleared.  Primary  osteoarthritis of both hands: She has PIP and DIP thickening consistent with osteoarthritis of both hands.  She experiences occasional discomfort and stiffness in both hands which is typically aggravated by typing for prolonged periods of time while at work.  Primary osteoarthritis of both feet: Hammertoes noted.  Thickening of bilateral 1st MTP joints.  Clinical findings and x-ray findings consistent with severe osteoarthritis and severe rheumatoid arthritis.  Pes cavus noted bilaterally.  She has no joint tenderness or inflammation noted on exam.   Osteopenia of multiple sites: DEXA performed on 10/23/2016 T-score -2.2.  She is due to update DEXA-ordered by gynecologist. She will continue to take vitamin D 2,000 units daily.   Other medical conditions are listed as follows:  History of basal cell cancer: Followed closely by her dermatologist, Dr. Ubaldo Glassing.  History of melanoma - Dr. Ubaldo Glassing  History of squamous cell carcinoma  History of hypertension  Anxiety  Orders: No orders of the defined types were placed in this encounter.  No orders of the defined types were placed in this encounter.    Follow-Up Instructions: Return in about 3 months (around 05/09/2020) for Rheumatoid arthritis.   Ofilia Neas, PA-C  Note - This record has been created using Dragon software.  Chart creation errors have been sought, but may not always  have been located. Such creation errors do not reflect on  the standard of medical care.

## 2020-02-09 ENCOUNTER — Telehealth: Payer: Self-pay | Admitting: *Deleted

## 2020-02-09 ENCOUNTER — Encounter: Payer: Self-pay | Admitting: Physician Assistant

## 2020-02-09 ENCOUNTER — Other Ambulatory Visit: Payer: Self-pay

## 2020-02-09 ENCOUNTER — Ambulatory Visit: Payer: 59 | Admitting: Physician Assistant

## 2020-02-09 VITALS — BP 132/87 | HR 65 | Resp 15 | Ht 65.0 in | Wt 165.0 lb

## 2020-02-09 DIAGNOSIS — Z85820 Personal history of malignant melanoma of skin: Secondary | ICD-10-CM

## 2020-02-09 DIAGNOSIS — Z8679 Personal history of other diseases of the circulatory system: Secondary | ICD-10-CM

## 2020-02-09 DIAGNOSIS — M8589 Other specified disorders of bone density and structure, multiple sites: Secondary | ICD-10-CM | POA: Diagnosis not present

## 2020-02-09 DIAGNOSIS — M19072 Primary osteoarthritis, left ankle and foot: Secondary | ICD-10-CM

## 2020-02-09 DIAGNOSIS — M19071 Primary osteoarthritis, right ankle and foot: Secondary | ICD-10-CM

## 2020-02-09 DIAGNOSIS — M0579 Rheumatoid arthritis with rheumatoid factor of multiple sites without organ or systems involvement: Secondary | ICD-10-CM

## 2020-02-09 DIAGNOSIS — F419 Anxiety disorder, unspecified: Secondary | ICD-10-CM

## 2020-02-09 DIAGNOSIS — Z79899 Other long term (current) drug therapy: Secondary | ICD-10-CM | POA: Diagnosis not present

## 2020-02-09 DIAGNOSIS — M19041 Primary osteoarthritis, right hand: Secondary | ICD-10-CM

## 2020-02-09 DIAGNOSIS — Z8589 Personal history of malignant neoplasm of other organs and systems: Secondary | ICD-10-CM | POA: Diagnosis not present

## 2020-02-09 DIAGNOSIS — Z85828 Personal history of other malignant neoplasm of skin: Secondary | ICD-10-CM

## 2020-02-09 DIAGNOSIS — M19042 Primary osteoarthritis, left hand: Secondary | ICD-10-CM

## 2020-02-09 NOTE — Patient Instructions (Signed)
Standing Labs We placed an order today for your standing lab work.   Please have your standing labs drawn in February and every 3 months    If possible, please have your labs drawn 2 weeks prior to your appointment so that the provider can discuss your results at your appointment.  We have open lab daily Monday through Thursday from 8:30-12:30 PM and 1:30-4:30 PM and Friday from 8:30-12:30 PM and 1:30-4:00 PM at the office of Dr. Shaili Deveshwar, [facility].   Please be advised, patients with office appointments requiring lab work will take precedents over walk-in lab work.  If possible, please come for your lab work on Monday and Friday afternoons, as you may experience shorter wait times. The office is located at [address], [address], [place], [place] [address] No appointment is necessary.   Labs are drawn by [facility]. Please bring your co-pay at the time of your lab draw.  You may receive a bill from [facility] for your lab work.  If you wish to have your labs drawn at another location, please call the office 24 hours in advance to send orders.  If you have any questions regarding directions or hours of operation,  please call [phone].   As a reminder, please drink plenty of water prior to coming for your lab work. Thanks!   

## 2020-02-09 NOTE — Telephone Encounter (Signed)
Spoke with patient and advised we have reached out to the pharmacy and she has one refill left. Patient advised to contact them to set up delivery. Patient expressed understanding.

## 2020-02-15 MED FILL — ORENCIA CLICKJECT 125 MG/ML: 125 | 28 days supply | Qty: 4 | Fill #2

## 2020-02-25 DIAGNOSIS — Z01419 Encounter for gynecological examination (general) (routine) without abnormal findings: Secondary | ICD-10-CM | POA: Diagnosis not present

## 2020-02-25 DIAGNOSIS — Z6827 Body mass index (BMI) 27.0-27.9, adult: Secondary | ICD-10-CM | POA: Diagnosis not present

## 2020-02-25 DIAGNOSIS — Z1231 Encounter for screening mammogram for malignant neoplasm of breast: Secondary | ICD-10-CM | POA: Diagnosis not present

## 2020-03-07 ENCOUNTER — Other Ambulatory Visit: Payer: Self-pay | Admitting: Physician Assistant

## 2020-03-08 ENCOUNTER — Other Ambulatory Visit: Payer: Self-pay | Admitting: Pharmacist

## 2020-03-08 MED ORDER — ORENCIA CLICKJECT 125 MG/ML ~~LOC~~ SOAJ: 125.0000 mg | SUBCUTANEOUS | 0 refills | Status: DC

## 2020-03-08 NOTE — Telephone Encounter (Signed)
Last Visit: 02/09/2020 Next Visit: 05/05/2020 Labs: 01/14/2020 CBC and CMP are normal. TB Gold:  11/11/2019 negative   Current Dose per office note on 02/09/2020: Orencia 125 mg sq injections once weekly  MN:[id number] arthritis involving multiple sites with positive rheumatoid factor   Okay to refill orencia?

## 2020-03-10 ENCOUNTER — Telehealth: Payer: Self-pay | Admitting: *Deleted

## 2020-03-10 NOTE — Telephone Encounter (Signed)
Received DEXA results from [facility].   Date of Scan: 02/25/2020 Lowest T-score and site measured: -1.3 Left Femoral Neck Significant changes in BMD and site measured (5% and above): n/a  Current Regimen: Vitamin D 2000 units daily.   Recommendation: Continue Vitamin D 2000 units daily and recommend resistive exercises.   Patient advised of results and recommendations.

## 2020-03-15 ENCOUNTER — Ambulatory Visit: Payer: 59 | Admitting: Rheumatology

## 2020-03-16 MED FILL — ORENCIA CLICKJECT 125 MG/ML: 125 | 28 days supply | Qty: 4 | Fill #0

## 2020-03-24 MED FILL — PARoxetine HCL 20 MG TABS: 20 | 90 days supply | Qty: 90 | Fill #2

## 2020-03-25 MED FILL — MONTELUKAST SOD 10 MG TAB: 10 | 90 days supply | Qty: 90 | Fill #2

## 2020-03-29 ENCOUNTER — Other Ambulatory Visit: Payer: Self-pay | Admitting: Rheumatology

## 2020-03-29 ENCOUNTER — Other Ambulatory Visit: Payer: Self-pay | Admitting: Physician Assistant

## 2020-03-29 DIAGNOSIS — M0579 Rheumatoid arthritis with rheumatoid factor of multiple sites without organ or systems involvement: Secondary | ICD-10-CM

## 2020-03-29 MED FILL — VALSARTAN-HCTZ 160-25 MG TA: 160-25 | 30 days supply | Qty: 30 | Fill #0

## 2020-03-29 MED FILL — HYDROXYCHLOROQUINE 200 MG T: 200 | 84 days supply | Qty: 120 | Fill #0

## 2020-03-29 NOTE — Telephone Encounter (Signed)
Last Visit: 02/09/2020 Next Visit: 05/05/2020 Labs: 01/14/2020 CBC and CMP are normal PLQ Eye exam: 04/13/19 WNL   Current Dose per office note on 02/09/2020: plaquenil 200 mg 1 tablet by mouth twice daily M-F.  UG:AYGEFUWTKT arthritis involving multiple sites with positive rheumatoid factor   Okay to refill per Dr. Estanislado Pandy

## 2020-03-30 ENCOUNTER — Other Ambulatory Visit (HOSPITAL_COMMUNITY): Payer: Self-pay | Admitting: Family Medicine

## 2020-03-30 DIAGNOSIS — G4700 Insomnia, unspecified: Secondary | ICD-10-CM | POA: Diagnosis not present

## 2020-03-30 DIAGNOSIS — M05742 Rheumatoid arthritis with rheumatoid factor of left hand without organ or systems involvement: Secondary | ICD-10-CM | POA: Diagnosis not present

## 2020-03-30 DIAGNOSIS — Z23 Encounter for immunization: Secondary | ICD-10-CM | POA: Diagnosis not present

## 2020-03-30 DIAGNOSIS — I10 Essential (primary) hypertension: Secondary | ICD-10-CM | POA: Diagnosis not present

## 2020-03-30 DIAGNOSIS — M069 Rheumatoid arthritis, unspecified: Secondary | ICD-10-CM | POA: Diagnosis not present

## 2020-03-30 DIAGNOSIS — J309 Allergic rhinitis, unspecified: Secondary | ICD-10-CM | POA: Diagnosis not present

## 2020-03-30 DIAGNOSIS — M05741 Rheumatoid arthritis with rheumatoid factor of right hand without organ or systems involvement: Secondary | ICD-10-CM | POA: Diagnosis not present

## 2020-03-30 DIAGNOSIS — F331 Major depressive disorder, recurrent, moderate: Secondary | ICD-10-CM | POA: Diagnosis not present

## 2020-03-30 DIAGNOSIS — F411 Generalized anxiety disorder: Secondary | ICD-10-CM | POA: Diagnosis not present

## 2020-04-12 MED FILL — ORENCIA CLICKJECT 125 MG/ML: 125 | 28 days supply | Qty: 4 | Fill #1

## 2020-04-22 NOTE — Progress Notes (Signed)
Office Visit Note  Patient: Kara Campbell             Date of Birth: [birth year]           MRN: [id number]             PCP: Fanny Bien, MD Referring: Fanny Bien, MD Visit Date: 05/05/2020 Occupation: @GUAROCC @  Subjective:  Right SI joint pain   History of Present Illness: Kara Campbell is a 60 y.o. female with history of rheumatoid arthritis and osteoarthritis.  She states she is doing very well on combination of Orencia, Arava and Plaquenil.  She will schedule eye examination this month.  She denies any joint pain but notices thickening in some of her MCP joints.  She has been having discomfort in her right SI joint for the last few days.  She states it could be the way she has been planning on her back.  There is no lower back pain or radiculopathy.  Activities of Daily Living:  Patient reports morning stiffness for 0 minutes.   Patient Reports nocturnal pain.  Difficulty dressing/grooming: Denies Difficulty climbing stairs: Denies Difficulty getting out of chair: Denies Difficulty using hands for taps, buttons, cutlery, and/or writing: Denies  Review of Systems  Constitutional: Positive for fatigue.  HENT: Negative for mouth sores, mouth dryness and nose dryness.   Eyes: Negative for pain, itching and dryness.  Respiratory: Negative for shortness of breath and difficulty breathing.   Cardiovascular: Negative for chest pain and palpitations.  Gastrointestinal: Negative for blood in stool, constipation and diarrhea.  Endocrine: Negative for increased urination.  Genitourinary: Negative for difficulty urinating.  Musculoskeletal: Positive for arthralgias, joint pain and joint swelling. Negative for myalgias, morning stiffness, muscle tenderness and myalgias.  Skin: Negative for color change, rash and redness.  Allergic/Immunologic: Negative for susceptible to infections.  Neurological: Negative for dizziness, numbness, headaches, memory loss and weakness.   Hematological: Negative for bruising/bleeding tendency.  Psychiatric/Behavioral: Negative for confusion and sleep disturbance.    PMFS History:  Patient Active Problem List   Diagnosis Date Noted  . History of hypertension 10/16/2016  . Essential hypertension 09/11/2016  . Rheumatoid arthritis involving multiple sites with positive rheumatoid factor ([place]) 01/23/2016  . High risk medication use 01/23/2016  . Primary osteoarthritis of both hands 01/23/2016  . Primary osteoarthritis of both feet 01/23/2016  . Malignant melanoma ([place]) 01/23/2016  . Basal cell carcinoma 01/23/2016  . Anxiety 01/23/2016  . Chest congestion 06/07/2015    Past Medical History:  Diagnosis Date  . Arthritis   . Melanoma ([place]) rhe  . Rheumatoid arthritis(714.0)     Family History  Problem Relation Age of Onset  . Cancer Mother        breast  . Breast cancer Mother 1  . Rheum arthritis Other    Past Surgical History:  Procedure Laterality Date  . CESAREAN SECTION    . INJECTION KNEE Right    Fluid removed from knee x2   . MELANOMA EXCISION    . SKIN BIOPSY     back of right leg, right buttocks, right chest, left cheek    Social History   Social History Narrative  . Not on file   Immunization History  Administered Date(s) Administered  . PFIZER(Purple Top)SARS-COV-2 Vaccination 03/01/2019, 03/22/2019, 11/24/2019     Objective: Vital Signs: BP 126/84 (BP Location: Right Arm, Patient Position: Sitting, Cuff Size: Normal)   Pulse (!) 59   Resp 13   Ht  5' 4.5" (1.638 m)   Wt 163 lb 3.2 oz (74 kg)   BMI 27.58 kg/m    Physical Exam Vitals and nursing note reviewed.  Constitutional:      Appearance: She is well-developed and well-nourished.  HENT:     Head: Normocephalic and atraumatic.  Eyes:     Extraocular Movements: EOM normal.     Conjunctiva/sclera: Conjunctivae normal.  Cardiovascular:     Rate and Rhythm: Normal rate and regular rhythm.     Pulses: Intact distal pulses.      Heart sounds: Normal heart sounds.  Pulmonary:     Effort: Pulmonary effort is normal.     Breath sounds: Normal breath sounds.  Abdominal:     General: Bowel sounds are normal.     Palpations: Abdomen is soft.  Musculoskeletal:     Cervical back: Normal range of motion.  Lymphadenopathy:     Cervical: No cervical adenopathy.  Skin:    General: Skin is warm and dry.     Capillary Refill: Capillary refill takes less than 2 seconds.  Neurological:     Mental Status: She is alert and oriented to person, place, and time.  Psychiatric:        Mood and Affect: Mood and affect normal.        Behavior: Behavior normal.      Musculoskeletal Exam: C-spine was in good range of motion.  She has mild tenderness over right SI joint.  Shoulder joints, elbow joints, wrist joints with good range of motion.  She has synovial thickening over MCP joints with no synovitis.  DIP and PIP prominence was noted.  Hip joints with good range of motion.  Knee joints with good range of motion.  She has bilateral pes cavus and hammertoes.  No synovitis was noted.  CDAI Exam: CDAI Score: 0  Patient Global: 0 mm; Provider Global: 0 mm Swollen: 0 ; Tender: 0  Joint Exam 05/05/2020   No joint exam has been documented for this visit   There is currently no information documented on the homunculus. Go to the Rheumatology activity and complete the homunculus joint exam.  Investigation: No additional findings.  Imaging: No results found.  Recent Labs: Lab Results  Component Value Date   WBC 5.0 01/14/2020   HGB 13.2 01/14/2020   PLT 292 01/14/2020   NA 138 01/14/2020   K 4.1 01/14/2020   CL 100 01/14/2020   CO2 28 01/14/2020   GLUCOSE 87 01/14/2020   BUN 10 01/14/2020   CREATININE 0.68 01/14/2020   BILITOT 0.8 01/14/2020   ALKPHOS 59 10/23/2016   AST 24 01/14/2020   ALT 19 01/14/2020   PROT 6.9 01/14/2020   ALBUMIN 4.2 10/23/2016   CALCIUM 9.8 01/14/2020   GFRAA 111 01/14/2020   QFTBGOLDPLUS  NEGATIVE 11/11/2019    Speciality Comments: PLQ Eye exam: 04/13/19 WNL @ [facility] Follow up in 1 year  Procedures:  No procedures performed Allergies: Amoxil [amoxicillin]   Assessment / Plan:     Visit Diagnoses: Rheumatoid arthritis involving multiple sites with positive rheumatoid factor ([place]) Erosive disease - Erosive disease.  She is clinically doing much better on the current combination of medications.  She had no synovitis on examination.  She continues to have some synovial thickening.  She denies any joint discomfort.  We discussed tapering Plaquenil and see if she can get off Plaquenil.  If she has recurrence of symptoms then we can go back on Plaquenil.  She will get eye examination this month.  High risk medication use - Orencia 125 mg sq injections once weekly (started on 12/18/19), arava 20 mg 1 tablet by mouth daily,plaquenil 200 mg 1 tablet by mouth twice daily M-F. - Plan: CBC with Differential/Platelet, COMPLETE METABOLIC PANEL WITH GFR today and then every 3 months to monitor for drug toxicity.  Primary osteoarthritis of both hands-joint protection muscle strengthening was discussed.  She has bilateral PIP and DIP thickening.  Primary osteoarthritis of both feet -she has bilateral pes cavus and hammertoes.  Use of orthotics was emphasized.  Osteopenia of multiple sites - DEXA performed on 10/23/2016 T-score -2.2.  She is due to update DEXA-ordered by gynecologist.  Her most recent bone density from February 25, 2020 showed a T score of -1.3LFN.  DEXA results were reviewed.  History of basal cell cancer - Followed closely by her dermatologist, Dr. Ubaldo Glassing.  History of squamous cell carcinoma  History of melanoma - Dr. Ubaldo Glassing  History of hypertension-blood pressure is normal today.  Anxiety  Orders: Orders Placed This Encounter  Procedures  . CBC with Differential/Platelet  . COMPLETE METABOLIC PANEL WITH GFR   No orders of the defined types were placed in  this encounter.    Follow-Up Instructions: Return in about 5 months (around 10/02/2020) for Rheumatoid arthritis.   Bo Merino, MD  Note - This record has been created using Editor, commissioning.  Chart creation errors have been sought, but may not always  have been located. Such creation errors do not reflect on  the standard of medical care.

## 2020-04-26 ENCOUNTER — Other Ambulatory Visit: Payer: Self-pay | Admitting: Physician Assistant

## 2020-04-26 MED FILL — LEFLUNOMIDE 20 MG TABLET: 20 | 90 days supply | Qty: 90 | Fill #0

## 2020-04-26 MED FILL — VALSARTAN-HCTZ 160-25 MG TA: 160-25 | 90 days supply | Qty: 90 | Fill #0

## 2020-04-26 NOTE — Telephone Encounter (Signed)
Last Visit: 02/09/2020,  Next Visit: 05/05/2020 Labs: 01/14/2020, CBC and CMP are normal.  Current Dose per office note 02/09/2020, Arava 20 mg 1 tablet by mouth daily DX:  Rheumatoid arthritis involving multiple sites with positive rheumatoid factor ([place]) Erosive disease   Last Fill: 12/22/2019  Okay to refill Arava?

## 2020-04-26 NOTE — Telephone Encounter (Signed)
We will update CBC and CMP at her upcoming follow up visit on 05/05/20.

## 2020-04-26 NOTE — Telephone Encounter (Signed)
Orders pending.

## 2020-05-04 DIAGNOSIS — D225 Melanocytic nevi of trunk: Secondary | ICD-10-CM | POA: Diagnosis not present

## 2020-05-04 DIAGNOSIS — D485 Neoplasm of uncertain behavior of skin: Secondary | ICD-10-CM | POA: Diagnosis not present

## 2020-05-04 DIAGNOSIS — D2272 Melanocytic nevi of left lower limb, including hip: Secondary | ICD-10-CM | POA: Diagnosis not present

## 2020-05-04 DIAGNOSIS — D2262 Melanocytic nevi of left upper limb, including shoulder: Secondary | ICD-10-CM | POA: Diagnosis not present

## 2020-05-04 DIAGNOSIS — C44319 Basal cell carcinoma of skin of other parts of face: Secondary | ICD-10-CM | POA: Diagnosis not present

## 2020-05-04 DIAGNOSIS — D2271 Melanocytic nevi of right lower limb, including hip: Secondary | ICD-10-CM | POA: Diagnosis not present

## 2020-05-04 DIAGNOSIS — C44722 Squamous cell carcinoma of skin of right lower limb, including hip: Secondary | ICD-10-CM | POA: Diagnosis not present

## 2020-05-04 DIAGNOSIS — Z85828 Personal history of other malignant neoplasm of skin: Secondary | ICD-10-CM | POA: Diagnosis not present

## 2020-05-04 DIAGNOSIS — Z85820 Personal history of malignant melanoma of skin: Secondary | ICD-10-CM | POA: Diagnosis not present

## 2020-05-04 DIAGNOSIS — C44529 Squamous cell carcinoma of skin of other part of trunk: Secondary | ICD-10-CM | POA: Diagnosis not present

## 2020-05-04 DIAGNOSIS — D2261 Melanocytic nevi of right upper limb, including shoulder: Secondary | ICD-10-CM | POA: Diagnosis not present

## 2020-05-04 DIAGNOSIS — L918 Other hypertrophic disorders of the skin: Secondary | ICD-10-CM | POA: Diagnosis not present

## 2020-05-04 DIAGNOSIS — L570 Actinic keratosis: Secondary | ICD-10-CM | POA: Diagnosis not present

## 2020-05-05 ENCOUNTER — Other Ambulatory Visit: Payer: Self-pay

## 2020-05-05 ENCOUNTER — Encounter: Payer: Self-pay | Admitting: Rheumatology

## 2020-05-05 ENCOUNTER — Ambulatory Visit: Payer: 59 | Admitting: Rheumatology

## 2020-05-05 VITALS — BP 126/84 | HR 59 | Resp 13 | Ht 64.5 in | Wt 163.2 lb

## 2020-05-05 DIAGNOSIS — F419 Anxiety disorder, unspecified: Secondary | ICD-10-CM

## 2020-05-05 DIAGNOSIS — Z79899 Other long term (current) drug therapy: Secondary | ICD-10-CM | POA: Diagnosis not present

## 2020-05-05 DIAGNOSIS — M0579 Rheumatoid arthritis with rheumatoid factor of multiple sites without organ or systems involvement: Secondary | ICD-10-CM | POA: Diagnosis not present

## 2020-05-05 DIAGNOSIS — M19041 Primary osteoarthritis, right hand: Secondary | ICD-10-CM

## 2020-05-05 DIAGNOSIS — M19042 Primary osteoarthritis, left hand: Secondary | ICD-10-CM

## 2020-05-05 DIAGNOSIS — M8589 Other specified disorders of bone density and structure, multiple sites: Secondary | ICD-10-CM | POA: Diagnosis not present

## 2020-05-05 DIAGNOSIS — Z85828 Personal history of other malignant neoplasm of skin: Secondary | ICD-10-CM

## 2020-05-05 DIAGNOSIS — Z85820 Personal history of malignant melanoma of skin: Secondary | ICD-10-CM

## 2020-05-05 DIAGNOSIS — Z8589 Personal history of malignant neoplasm of other organs and systems: Secondary | ICD-10-CM | POA: Diagnosis not present

## 2020-05-05 DIAGNOSIS — M19071 Primary osteoarthritis, right ankle and foot: Secondary | ICD-10-CM | POA: Diagnosis not present

## 2020-05-05 DIAGNOSIS — M19072 Primary osteoarthritis, left ankle and foot: Secondary | ICD-10-CM

## 2020-05-05 DIAGNOSIS — Z8679 Personal history of other diseases of the circulatory system: Secondary | ICD-10-CM

## 2020-05-05 NOTE — Patient Instructions (Signed)
Standing Labs We placed an order today for your standing lab work.   Please have your standing labs drawn in May and every 3 months  If possible, please have your labs drawn 2 weeks prior to your appointment so that the provider can discuss your results at your appointment.  We have open lab daily Monday through Thursday from 1:30-4:30 PM and Friday from 1:30-4:00 PM at the office of Dr. Bo Merino, [facility].   Please be advised, all patients with office appointments requiring lab work will take precedents over walk-in lab work.  If possible, please come for your lab work on Monday and Friday afternoons, as you may experience shorter wait times. The office is located at [address], [place], [place], [place] [address] No appointment is necessary.   Labs are drawn by [facility]. Please bring your co-pay at the time of your lab draw.  You may receive a bill from [facility] for your lab work.  If you wish to have your labs drawn at another location, please call the office 24 hours in advance to send orders.  If you have any questions regarding directions or hours of operation,  please call [phone].   As a reminder, please drink plenty of water prior to coming for your lab work. Thanks!  COVID-19 vaccine recommendations:   COVID-19 vaccine is recommended for everyone (unless you are allergic to a vaccine component), even if you are on a medication that suppresses your immune system.   If you are on Methotrexate, Cellcept (mycophenolate), Rinvoq, Morrie Sheldon, and Olumiant- hold the medication for 1 week after each vaccine. Hold Methotrexate for 2 weeks after the single dose COVID-19 vaccine.   If you are on Orencia subcutaneous injection - hold medication one week prior to and one week after the first COVID-19 vaccine dose (only).   It is recommended that patients on immunosuppressive therapy should get COVID-19 vaccine first 3 doses 1 month apart and a fourth dose (  booster) 6 months after the third dose.  Do not take Tylenol or any anti-inflammatory medications (NSAIDs) 24 hours prior to the COVID-19 vaccination.   There is no direct evidence about the efficacy of the COVID-19 vaccine in individuals who are on medications that suppress the immune system.   Even if you are fully vaccinated, and you are on any medications that suppress your immune system, please continue to wear a mask, maintain at least six feet social distance and practice hand hygiene.   If you develop a COVID-19 infection, please contact your PCP or our office to determine if you need monoclonal antibody infusion.  The booster vaccine is now available for immunocompromised patients.   Please see the following web sites for updated information.   [email or web address]

## 2020-05-09 MED FILL — ORENCIA CLICKJECT 125 MG/ML: 125 | 28 days supply | Qty: 4 | Fill #2

## 2020-05-12 ENCOUNTER — Other Ambulatory Visit (HOSPITAL_COMMUNITY): Payer: Self-pay | Admitting: Dermatology

## 2020-05-12 DIAGNOSIS — C44319 Basal cell carcinoma of skin of other parts of face: Secondary | ICD-10-CM | POA: Diagnosis not present

## 2020-05-12 DIAGNOSIS — Z85828 Personal history of other malignant neoplasm of skin: Secondary | ICD-10-CM | POA: Diagnosis not present

## 2020-05-12 DIAGNOSIS — Z85820 Personal history of malignant melanoma of skin: Secondary | ICD-10-CM | POA: Diagnosis not present

## 2020-05-12 MED FILL — DOXYCYCLINE HYCLATE 100 MG: 100 | 5 days supply | Qty: 10 | Fill #0

## 2020-05-19 ENCOUNTER — Other Ambulatory Visit: Payer: Self-pay

## 2020-05-19 DIAGNOSIS — Z79899 Other long term (current) drug therapy: Secondary | ICD-10-CM | POA: Diagnosis not present

## 2020-05-20 LAB — CBC WITH DIFFERENTIAL/PLATELET
Absolute Monocytes: 826 cells/uL (ref 200–950)
Basophils Absolute: 63 cells/uL (ref 0–200)
Basophils Relative: 0.9 %
Eosinophils Absolute: 161 cells/uL (ref 15–500)
Eosinophils Relative: 2.3 %
HCT: 39.1 % (ref 35.0–45.0)
Hemoglobin: 13.3 g/dL (ref 11.7–15.5)
Lymphs Abs: 1834 cells/uL (ref 850–3900)
MCH: 31.2 pg (ref 27.0–33.0)
MCHC: 34 g/dL (ref 32.0–36.0)
MCV: 91.8 fL (ref 80.0–100.0)
MPV: 10.3 fL (ref 7.5–12.5)
Monocytes Relative: 11.8 %
Neutro Abs: 4116 cells/uL (ref 1500–7800)
Neutrophils Relative %: 58.8 %
Platelets: 306 10*3/uL (ref 140–400)
RBC: 4.26 10*6/uL (ref 3.80–5.10)
RDW: 11.9 % (ref 11.0–15.0)
Total Lymphocyte: 26.2 %
WBC: 7 10*3/uL (ref 3.8–10.8)

## 2020-05-20 LAB — COMPLETE METABOLIC PANEL WITH GFR
AG Ratio: 2.1 (calc) (ref 1.0–2.5)
ALT: 20 U/L (ref 6–29)
AST: 21 U/L (ref 10–35)
Albumin: 4.5 g/dL (ref 3.6–5.1)
Alkaline phosphatase (APISO): 52 U/L (ref 37–153)
BUN: 18 mg/dL (ref 7–25)
CO2: 28 mmol/L (ref 20–32)
Calcium: 9.5 mg/dL (ref 8.6–10.4)
Chloride: 100 mmol/L (ref 98–110)
Creat: 0.93 mg/dL (ref 0.50–1.05)
GFR, Est African American: 78 mL/min/{1.73_m2} (ref >=60)
GFR, Est Non African American: 67 mL/min/{1.73_m2} (ref >=60)
Globulin: 2.1 g/dL (calc) (ref 1.9–3.7)
Glucose, Bld: 77 mg/dL (ref 65–99)
Potassium: 4.3 mmol/L (ref 3.5–5.3)
Sodium: 137 mmol/L (ref 135–146)
Total Bilirubin: 0.5 mg/dL (ref 0.2–1.2)
Total Protein: 6.6 g/dL (ref 6.1–8.1)

## 2020-05-20 NOTE — Progress Notes (Signed)
CBC and CMP are normal.

## 2020-06-02 ENCOUNTER — Other Ambulatory Visit: Payer: Self-pay | Admitting: Rheumatology

## 2020-06-02 DIAGNOSIS — L820 Inflamed seborrheic keratosis: Secondary | ICD-10-CM | POA: Diagnosis not present

## 2020-06-02 DIAGNOSIS — D485 Neoplasm of uncertain behavior of skin: Secondary | ICD-10-CM | POA: Diagnosis not present

## 2020-06-02 DIAGNOSIS — L570 Actinic keratosis: Secondary | ICD-10-CM | POA: Diagnosis not present

## 2020-06-02 DIAGNOSIS — L988 Other specified disorders of the skin and subcutaneous tissue: Secondary | ICD-10-CM | POA: Diagnosis not present

## 2020-06-02 DIAGNOSIS — Z85828 Personal history of other malignant neoplasm of skin: Secondary | ICD-10-CM | POA: Diagnosis not present

## 2020-06-02 DIAGNOSIS — Z85820 Personal history of malignant melanoma of skin: Secondary | ICD-10-CM | POA: Diagnosis not present

## 2020-06-02 NOTE — Telephone Encounter (Signed)
Next Visit: 10/06/2020  Last Visit: 05/05/2020  Last Fill: 03/08/2020  HY:IFOYDXAJOI arthritis involving multiple sites with positive rheumatoid factor   Current Dose per office note on 05/05/2020: Orencia 125 mg sq injections once weekly  Labs: 05/19/2020 CBC and CMP are normal.  TB Gold:11/11/2019 negative   Okay to refill orencia?

## 2020-06-03 ENCOUNTER — Other Ambulatory Visit: Payer: Self-pay | Admitting: Pharmacist

## 2020-06-03 MED ORDER — ORENCIA CLICKJECT 125 MG/ML ~~LOC~~ SOAJ: 125.0000 mg | SUBCUTANEOUS | 0 refills | Status: DC

## 2020-06-06 MED FILL — ORENCIA CLICKJECT 125 MG/ML: 125 | 28 days supply | Qty: 4 | Fill #0

## 2020-06-07 ENCOUNTER — Other Ambulatory Visit (HOSPITAL_COMMUNITY): Payer: Self-pay

## 2020-06-24 ENCOUNTER — Other Ambulatory Visit (HOSPITAL_COMMUNITY): Payer: Self-pay

## 2020-06-24 MED FILL — Paroxetine HCl Tab 20 MG: ORAL | 90 days supply | Qty: 90 | Fill #0 | Status: AC

## 2020-06-24 MED FILL — Montelukast Sodium Tab 10 MG (Base Equiv): ORAL | 90 days supply | Qty: 90 | Fill #0 | Status: AC

## 2020-07-01 ENCOUNTER — Other Ambulatory Visit (HOSPITAL_COMMUNITY): Payer: Self-pay

## 2020-07-01 MED FILL — Abatacept Subcutaneous Soln Auto-Injector 125 MG/ML: SUBCUTANEOUS | 28 days supply | Qty: 4 | Fill #0 | Status: AC

## 2020-07-06 ENCOUNTER — Other Ambulatory Visit (HOSPITAL_COMMUNITY): Payer: Self-pay

## 2020-07-28 ENCOUNTER — Other Ambulatory Visit (HOSPITAL_COMMUNITY): Payer: Self-pay

## 2020-07-28 ENCOUNTER — Other Ambulatory Visit: Payer: Self-pay

## 2020-07-28 DIAGNOSIS — Z79899 Other long term (current) drug therapy: Secondary | ICD-10-CM

## 2020-07-28 MED ORDER — LEFLUNOMIDE 20 MG PO TABS
ORAL_TABLET | Freq: Every day | ORAL | 0 refills | Status: DC
  Filled 2020-07-28: qty 90, 90d supply, fill #0

## 2020-07-28 MED FILL — Valsartan-Hydrochlorothiazide Tab 160-25 MG: ORAL | 90 days supply | Qty: 90 | Fill #0 | Status: AC

## 2020-07-28 NOTE — Telephone Encounter (Signed)
Patient came in the office this morning for labwork and requested prescription refill of Garza-Salinas II.

## 2020-07-28 NOTE — Telephone Encounter (Signed)
Next Visit: 10/06/2020  Last Visit: 05/05/2020  Last Fill: 04/26/2020  DX:  Rheumatoid arthritis involving multiple sites with positive rheumatoid factor   Current Dose per office note 05/05/2020, arava 20 mg 1 tablet by mouth daily  Labs: 07/28/2020, pending results, 05/19/2020, CBC and CMP are normal.  Okay to refill arava?

## 2020-07-29 ENCOUNTER — Other Ambulatory Visit (HOSPITAL_COMMUNITY): Payer: Self-pay

## 2020-07-29 LAB — COMPLETE METABOLIC PANEL WITH GFR
AG Ratio: 2 (calc) (ref 1.0–2.5)
ALT: 19 U/L (ref 6–29)
AST: 21 U/L (ref 10–35)
Albumin: 4.3 g/dL (ref 3.6–5.1)
Alkaline phosphatase (APISO): 47 U/L (ref 37–153)
BUN: 13 mg/dL (ref 7–25)
CO2: 26 mmol/L (ref 20–32)
Calcium: 9.4 mg/dL (ref 8.6–10.4)
Chloride: 101 mmol/L (ref 98–110)
Creat: 0.67 mg/dL (ref 0.50–1.05)
GFR, Est African American: 112 mL/min/{1.73_m2} (ref >=60)
GFR, Est Non African American: 96 mL/min/{1.73_m2} (ref >=60)
Globulin: 2.1 g/dL (calc) (ref 1.9–3.7)
Glucose, Bld: 88 mg/dL (ref 65–99)
Potassium: 4.2 mmol/L (ref 3.5–5.3)
Sodium: 137 mmol/L (ref 135–146)
Total Bilirubin: 0.5 mg/dL (ref 0.2–1.2)
Total Protein: 6.4 g/dL (ref 6.1–8.1)

## 2020-07-29 LAB — CBC WITH DIFFERENTIAL/PLATELET
Absolute Monocytes: 546 cells/uL (ref 200–950)
Basophils Absolute: 62 cells/uL (ref 0–200)
Basophils Relative: 1.4 %
Eosinophils Absolute: 110 cells/uL (ref 15–500)
Eosinophils Relative: 2.5 %
HCT: 38 % (ref 35.0–45.0)
Hemoglobin: 12.5 g/dL (ref 11.7–15.5)
Lymphs Abs: 1109 cells/uL (ref 850–3900)
MCH: 30.4 pg (ref 27.0–33.0)
MCHC: 32.9 g/dL (ref 32.0–36.0)
MCV: 92.5 fL (ref 80.0–100.0)
MPV: 9.9 fL (ref 7.5–12.5)
Monocytes Relative: 12.4 %
Neutro Abs: 2574 cells/uL (ref 1500–7800)
Neutrophils Relative %: 58.5 %
Platelets: 279 10*3/uL (ref 140–400)
RBC: 4.11 10*6/uL (ref 3.80–5.10)
RDW: 12.2 % (ref 11.0–15.0)
Total Lymphocyte: 25.2 %
WBC: 4.4 10*3/uL (ref 3.8–10.8)

## 2020-07-29 MED FILL — Abatacept Subcutaneous Soln Auto-Injector 125 MG/ML: SUBCUTANEOUS | 28 days supply | Qty: 4 | Fill #1 | Status: AC

## 2020-07-29 NOTE — Progress Notes (Signed)
CBC and CMP normal

## 2020-08-01 ENCOUNTER — Other Ambulatory Visit (HOSPITAL_COMMUNITY): Payer: Self-pay

## 2020-08-03 ENCOUNTER — Other Ambulatory Visit (HOSPITAL_COMMUNITY): Payer: Self-pay

## 2020-08-12 ENCOUNTER — Other Ambulatory Visit (HOSPITAL_COMMUNITY): Payer: Self-pay

## 2020-08-12 MED ORDER — CARESTART COVID-19 HOME TEST VI KIT
PACK | 0 refills | Status: DC
  Filled 2020-08-12: qty 4, 4d supply, fill #0

## 2020-08-16 ENCOUNTER — Other Ambulatory Visit (HOSPITAL_COMMUNITY): Payer: Self-pay

## 2020-08-16 ENCOUNTER — Telehealth: Payer: Self-pay

## 2020-08-16 DIAGNOSIS — U071 COVID-19: Secondary | ICD-10-CM | POA: Diagnosis not present

## 2020-08-16 MED ORDER — PAXLOVID 20 X 150 MG & 10 X 100MG PO TBPK
ORAL_TABLET | ORAL | 0 refills | Status: DC
  Filled 2020-08-16: qty 30, 5d supply, fill #0

## 2020-08-16 NOTE — Telephone Encounter (Signed)
Patient called stating she tested positive for Covid yesterday, 08/15/20.  Patient states she started having symptoms on Saturday, 08/13/20 and took two home tests which were both negative, but yesterday her supervisor wanted her to have the PCR through [facility] which was positive.  Patient states Dr. Estanislado Pandy told her to contact the office so they could refer her for monoclonal antibody infusion.  Patient states her friend Ria Comment who is a Marine scientist at the infusion center states they are only doing them on Monday/Wednesday/Friday.  Patient states she would like to go on Wednesday.

## 2020-08-16 NOTE — Telephone Encounter (Signed)
Reviewed the following with patient. Patient advised to contact PCP for treatment. Patient expressed understanding.   If you test POSITIVE for COVID19 and have MILD to MODERATE symptoms: o First, call your PCP if you would like to receive COVID19 treatment AND o Hold your medications during the infection and for at least 1 week after your symptoms have resolved: - Injectable medication (Benlysta, Cimzia, Cosentyx, Enbrel, Humira, Orencia, Remicade, Simponi, Stelara, Taltz, Tremfya) - Methotrexate - Leflunomide (Arava) - Mycophenolate (Cellcept) - Morrie Sheldon, Olumiant, or Rinvoq o If you take Actemra or Kevzara, you DO NOT need to hold these for COVID19 infection.

## 2020-08-19 DIAGNOSIS — U071 COVID-19: Secondary | ICD-10-CM | POA: Diagnosis not present

## 2020-08-23 ENCOUNTER — Other Ambulatory Visit (HOSPITAL_COMMUNITY): Payer: Self-pay

## 2020-08-23 ENCOUNTER — Other Ambulatory Visit: Payer: Self-pay | Admitting: Internal Medicine

## 2020-08-23 NOTE — Telephone Encounter (Signed)
Requested medication (s) are due for refill today: yes  Requested medication (s) are on the active medication list: yes   Last refill: 07/29/2020  Future visit scheduled: no  Notes to clinic: this refill cannot be delegated    Requested Prescriptions  Pending Prescriptions Disp Refills   Abatacept (ORENCIA CLICKJECT) 314 MG/ML SOAJ 12 mL 0    Sig: INJECT 125 MG INTO THE SKIN ONCE A WEEK.      Not Delegated - Analgesics:  Antirheumatic Agents - abatacept & hydroxychloroquine Failed - 08/23/2020  1:45 PM      Failed - This refill cannot be delegated      Failed - Valid encounter within last 6 months    Recent Outpatient Visits           8 months ago Encounter for medication review   [place], RPH-CPP   3 years ago Essential hypertension   [facility] at [facility], Ines Bloomer, MD   5 years ago Cough   Primary Care at [place], [place]       Future Appointments             In 1 month Bo Merino, MD [facility]

## 2020-08-24 ENCOUNTER — Other Ambulatory Visit (HOSPITAL_COMMUNITY): Payer: Self-pay

## 2020-08-25 ENCOUNTER — Other Ambulatory Visit: Payer: Self-pay | Admitting: Rheumatology

## 2020-08-25 ENCOUNTER — Other Ambulatory Visit (HOSPITAL_COMMUNITY): Payer: Self-pay

## 2020-08-29 ENCOUNTER — Other Ambulatory Visit (HOSPITAL_COMMUNITY): Payer: Self-pay

## 2020-08-29 MED ORDER — ORENCIA CLICKJECT 125 MG/ML ~~LOC~~ SOAJ
125.0000 mg | SUBCUTANEOUS | 0 refills | Status: DC
  Filled 2020-08-29: qty 4, 28d supply, fill #0

## 2020-08-29 NOTE — Telephone Encounter (Signed)
Next Visit: 10/06/2020   Last Visit: 05/05/2020   Last Fill: 06/03/2020   UO:[id number] arthritis involving multiple sites with positive rheumatoid factor    Current Dose per office note on 05/05/2020: Orencia 125 mg sq injections once weekly   Labs: 07/28/2020 CBC and CMP are normal.   TB Gold:11/11/2019 negative   Okay to refill Orencia?

## 2020-09-05 ENCOUNTER — Other Ambulatory Visit (HOSPITAL_COMMUNITY): Payer: Self-pay

## 2020-09-13 ENCOUNTER — Other Ambulatory Visit (HOSPITAL_COMMUNITY): Payer: Self-pay

## 2020-09-14 ENCOUNTER — Other Ambulatory Visit (HOSPITAL_COMMUNITY): Payer: Self-pay

## 2020-09-14 ENCOUNTER — Other Ambulatory Visit: Payer: Self-pay | Admitting: Pharmacist

## 2020-09-14 MED ORDER — ORENCIA CLICKJECT 125 MG/ML ~~LOC~~ SOAJ
125.0000 mg | SUBCUTANEOUS | 0 refills | Status: DC
  Filled 2020-09-14: qty 12, 84d supply, fill #0
  Filled 2020-09-30: qty 4, 28d supply, fill #0
  Filled 2020-11-04: qty 4, 28d supply, fill #1
  Filled 2020-12-09: qty 4, 28d supply, fill #2

## 2020-09-22 ENCOUNTER — Other Ambulatory Visit (HOSPITAL_COMMUNITY): Payer: Self-pay

## 2020-09-22 MED ORDER — PAROXETINE HCL 20 MG PO TABS
ORAL_TABLET | ORAL | 0 refills | Status: DC
  Filled 2020-09-22: qty 30, 30d supply, fill #0

## 2020-09-22 MED ORDER — MONTELUKAST SODIUM 10 MG PO TABS
ORAL_TABLET | ORAL | 0 refills | Status: DC
  Filled 2020-09-22: qty 30, 30d supply, fill #0

## 2020-09-22 NOTE — Progress Notes (Deleted)
Office Visit Note  Patient: Kara Campbell             Date of Birth: 1960/12/25           MRN: [id number]             PCP: Fanny Bien, MD Referring: Fanny Bien, MD Visit Date: 10/06/2020 Occupation: @GUAROCC @  Subjective:  No chief complaint on file.   History of Present Illness: Kara Campbell is a 60 y.o. female ***   Activities of Daily Living:  Patient reports morning stiffness for *** {minute/hour:19697}.   Patient {ACTIONS;DENIES/REPORTS:21021675::"Denies"} nocturnal pain.  Difficulty dressing/grooming: {ACTIONS;DENIES/REPORTS:21021675::"Denies"} Difficulty climbing stairs: {ACTIONS;DENIES/REPORTS:21021675::"Denies"} Difficulty getting out of chair: {ACTIONS;DENIES/REPORTS:21021675::"Denies"} Difficulty using hands for taps, buttons, cutlery, and/or writing: {ACTIONS;DENIES/REPORTS:21021675::"Denies"}  No Rheumatology ROS completed.   PMFS History:  Patient Active Problem List   Diagnosis Date Noted   History of hypertension 10/16/2016   Essential hypertension 09/11/2016   Rheumatoid arthritis involving multiple sites with positive rheumatoid factor ([place]) 01/23/2016   High risk medication use 01/23/2016   Primary osteoarthritis of both hands 01/23/2016   Primary osteoarthritis of both feet 01/23/2016   Malignant melanoma ([place]) 01/23/2016   Basal cell carcinoma 01/23/2016   Anxiety 01/23/2016   Chest congestion 06/07/2015    Past Medical History:  Diagnosis Date   Arthritis    Melanoma ([place]) rhe   Rheumatoid arthritis(714.0)     Family History  Problem Relation Age of Onset   Cancer Mother        breast   Breast cancer Mother 42   Rheum arthritis Other    Past Surgical History:  Procedure Laterality Date   CESAREAN SECTION     INJECTION KNEE Right    Fluid removed from knee x2    MELANOMA EXCISION     SKIN BIOPSY     back of right leg, right buttocks, right chest, left cheek    Social History   Social History Narrative   Not  on file   Immunization History  Administered Date(s) Administered   PFIZER(Purple Top)SARS-COV-2 Vaccination 03/01/2019, 03/22/2019, 11/24/2019     Objective: Vital Signs: There were no vitals taken for this visit.   Physical Exam   Musculoskeletal Exam: ***  CDAI Exam: CDAI Score: -- Patient Global: --; Provider Global: -- Swollen: --; Tender: -- Joint Exam 10/06/2020   No joint exam has been documented for this visit   There is currently no information documented on the homunculus. Go to the Rheumatology activity and complete the homunculus joint exam.  Investigation: No additional findings.  Imaging: No results found.  Recent Labs: Lab Results  Component Value Date   WBC 4.4 07/28/2020   HGB 12.5 07/28/2020   PLT 279 07/28/2020   NA 137 07/28/2020   K 4.2 07/28/2020   CL 101 07/28/2020   CO2 26 07/28/2020   GLUCOSE 88 07/28/2020   BUN 13 07/28/2020   CREATININE 0.67 07/28/2020   BILITOT 0.5 07/28/2020   ALKPHOS 59 10/23/2016   AST 21 07/28/2020   ALT 19 07/28/2020   PROT 6.4 07/28/2020   ALBUMIN 4.2 10/23/2016   CALCIUM 9.4 07/28/2020   GFRAA 112 07/28/2020   QFTBGOLDPLUS NEGATIVE 11/11/2019    Speciality Comments: PLQ Eye exam: 04/13/19 WNL @ [facility] Follow up in 1 year  Procedures:  No procedures performed Allergies: Amoxil [amoxicillin]   Assessment / Plan:     Visit Diagnoses: No diagnosis found.  Orders: No orders of the  defined types were placed in this encounter.  No orders of the defined types were placed in this encounter.   Face-to-face time spent with patient was *** minutes. Greater than 50% of time was spent in counseling and coordination of care.  Follow-Up Instructions: No follow-ups on file.   Earnestine Mealing, CMA  Note - This record has been created using Editor, commissioning.  Chart creation errors have been sought, but may not always  have been located. Such creation errors do not reflect on  the standard of  medical care.

## 2020-09-26 ENCOUNTER — Other Ambulatory Visit (HOSPITAL_COMMUNITY): Payer: Self-pay

## 2020-09-30 ENCOUNTER — Other Ambulatory Visit (HOSPITAL_COMMUNITY): Payer: Self-pay

## 2020-10-05 NOTE — Progress Notes (Signed)
Office Visit Note  Patient: Kara Campbell             Date of Birth: 02/17/1961           MRN: [id number]             PCP: Fanny Bien, MD Referring: Fanny Bien, MD Visit Date: 10/11/2020 Occupation: '@GUAROCC'$ @  Subjective:  Medication monitoring   History of Present Illness: Kara Campbell is a 60 y.o. female with seropositive rheumatoid arthritis and osteoarthritis.  She is on orencia 125 mg sq injections once weekly and arava 20 mg 1 tablet by mouth daily.  She discontinued Plaquenil in March 2022.  She has not noticed any increased joint pain or joint swelling since discontinuing Plaquenil.  She has been experiencing some increased discomfort in both feet at night which she attributes to being on her feet for long periods of time while at work.  She has occasional pain and stiffness in her right hand but states that the joint swelling has improved. She tested positive for COVID-19 on 08/15/2020.  She was treated with Paxil bid by her PCP.  She continues to have a lingering cough due to postnasal drip but denies any other residual symptoms.  She had Orencia during the infection but continued on [place] as prescribed.  She denies any other infections recently.    Activities of Daily Living:  Patient reports morning stiffness for 0 minutes.   Patient Denies nocturnal pain.  Difficulty dressing/grooming: Denies Difficulty climbing stairs: Denies Difficulty getting out of chair: Denies Difficulty using hands for taps, buttons, cutlery, and/or writing: Denies  Review of Systems  Constitutional:  Positive for fatigue.  HENT:  Negative for mouth sores, mouth dryness and nose dryness.   Eyes:  Negative for pain, itching and dryness.  Respiratory:  Negative for shortness of breath and difficulty breathing.   Cardiovascular:  Negative for chest pain and palpitations.  Gastrointestinal:  Negative for blood in stool, constipation and diarrhea.  Endocrine: Negative for increased  urination.  Genitourinary:  Negative for difficulty urinating.  Musculoskeletal:  Positive for joint pain, joint pain, joint swelling, myalgias and myalgias. Negative for morning stiffness and muscle tenderness.  Skin:  Negative for color change, rash and redness.  Allergic/Immunologic: Negative for susceptible to infections.  Neurological:  Negative for dizziness, numbness, headaches, memory loss and weakness.  Hematological:  Negative for bruising/bleeding tendency.  Psychiatric/Behavioral:  Negative for confusion.    PMFS History:  Patient Active Problem List   Diagnosis Date Noted   History of hypertension 10/16/2016   Essential hypertension 09/11/2016   Rheumatoid arthritis involving multiple sites with positive rheumatoid factor ([place]) 01/23/2016   High risk medication use 01/23/2016   Primary osteoarthritis of both hands 01/23/2016   Primary osteoarthritis of both feet 01/23/2016   Malignant melanoma ([place]) 01/23/2016   Basal cell carcinoma 01/23/2016   Anxiety 01/23/2016   Chest congestion 06/07/2015    Past Medical History:  Diagnosis Date   Arthritis    COVID-19    Melanoma ([place]) rhe   Rheumatoid arthritis(714.0)     Family History  Problem Relation Age of Onset   Cancer Mother        breast   Breast cancer Mother 54   Rheum arthritis Other    Past Surgical History:  Procedure Laterality Date   CESAREAN SECTION     INJECTION KNEE Right    Fluid removed from knee x2    MELANOMA EXCISION  SKIN BIOPSY     back of right leg, right buttocks, right chest, left cheek    Social History   Social History Narrative   Not on file   Immunization History  Administered Date(s) Administered   PFIZER(Purple Top)SARS-COV-2 Vaccination 03/01/2019, 03/22/2019, 11/24/2019     Objective: Vital Signs: BP 126/77 (BP Location: Left Arm, Patient Position: Sitting, Cuff Size: Normal)   Pulse 68   Ht 5' 4.5" (1.638 m)   Wt 161 lb 9.6 oz (73.3 kg)   BMI 27.31 kg/m     Physical Exam Vitals and nursing note reviewed.  Constitutional:      Appearance: She is well-developed.  HENT:     Head: Normocephalic and atraumatic.  Eyes:     Conjunctiva/sclera: Conjunctivae normal.  Pulmonary:     Effort: Pulmonary effort is normal.  Abdominal:     Palpations: Abdomen is soft.  Musculoskeletal:     Cervical back: Normal range of motion.  Skin:    General: Skin is warm and dry.     Capillary Refill: Capillary refill takes less than 2 seconds.  Neurological:     Mental Status: She is alert and oriented to person, place, and time.  Psychiatric:        Behavior: Behavior normal.     Musculoskeletal Exam: C-spine, thoracic spine, and lumbar spine good ROM.  Shoulder joints, elbow joints, wrist joints, MCPs, PIPs, and DIPs good ROM with no synovitis.  Thickening of the right 2nd and 3rd MCP joints.  PIP and DIP thickening consistent with osteoarthritis of both hands.  Hip joints have good ROM with no discomfort.  Knee joints have good ROM with no discomfort.  No warmth or effusion of knee joints. Ankle joints have good ROM with no tenderness or joint swelling. Hammertoes noted. No tenderness over MTP joints.   CDAI Exam: CDAI Score: 0.2  Patient Global: 1 mm; Provider Global: 1 mm Swollen: 0 ; Tender: 0  Joint Exam 10/11/2020   No joint exam has been documented for this visit   There is currently no information documented on the homunculus. Go to the Rheumatology activity and complete the homunculus joint exam.  Investigation: No additional findings.  Imaging: No results found.  Recent Labs: Lab Results  Component Value Date   WBC 4.4 07/28/2020   HGB 12.5 07/28/2020   PLT 279 07/28/2020   NA 137 07/28/2020   K 4.2 07/28/2020   CL 101 07/28/2020   CO2 26 07/28/2020   GLUCOSE 88 07/28/2020   BUN 13 07/28/2020   CREATININE 0.67 07/28/2020   BILITOT 0.5 07/28/2020   ALKPHOS 59 10/23/2016   AST 21 07/28/2020   ALT 19 07/28/2020   PROT 6.4  07/28/2020   ALBUMIN 4.2 10/23/2016   CALCIUM 9.4 07/28/2020   GFRAA 112 07/28/2020   QFTBGOLDPLUS NEGATIVE 11/11/2019    Speciality Comments: PLQ Eye exam: 04/13/19 WNL @ [facility] Follow up in 1 year  Procedures:  No procedures performed Allergies: Amoxil [amoxicillin]   Assessment / Plan:     Visit Diagnoses: Rheumatoid arthritis involving multiple sites with positive rheumatoid factor ([place]) Erosive disease: She has no joint tenderness or synovitis on exam.  She has synovial thickening over the right second and third MCP joints but no synovitis was noted.  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  Overall she has noticed  significant clinical improvement on Orencia 125 mg sq injections once weekly and Arava 20 mg 1 tablet by mouth daily.  She discontinued Plaquenil in March 2022 due to noticed clinical improvement on combination therapy with [place] and [place].  She has not noticed any increased joint pain or swelling since discontinuing Plaquenil.  X-rays of both hands and feet were updated on 10/12/2019: findings were consistent with erosive rheumatoid arthritis and osteoarthritis.  Radiographic progression was noted in the right hand when compared to x-rays from 2017.  She remain on the current medication regimen.  She does not need any refills at this time.  She was advised to notify us if she develops increased joint pain or joint swelling.  She will follow-up in the office in 5 months.  High risk medication use - Orencia 125 mg sq injections once weekly (started on 12/18/19) and arava 20 mg 1 tablet by mouth daily.  Discontinued Plaquenil in March 2022 due to clinically doing well on combination therapy with [place] and [place].  CBC and CMP were within normal limits on 07/28/2020.  Orders for CBC and CMP were released today.  Her next lab work will be due in November  and every 3 months to monitor for drug toxicity.  TB Gold negative on 11/11/2019.  Future order for TB gold will be placed today.- Plan: CBC with Differential/Platelet, COMPLETE METABOLIC PANEL WITH GFR She was diagnosed with COVID-19 on 08/15/2020.  She was prescribed Paxil but by her PCP.  She had Orencia during the infection but continued on [place] as prescribed.  We discussed the importance of holding [place] whenever she develops signs or symptoms of an infection and to resume only once the infection is completely cleared.  She voiced understanding.  Primary osteoarthritis of both hands: She has PIP and DIP thickening consistent with osteoarthritis of both hands.  She was able to make a complete fist bilaterally.  No tenderness or synovitis was noted on examination today.  Discussed the importance of joint protection and muscle strengthening.  Primary osteoarthritis of both feet: X-rays of both feet were updated on 10/12/2019 and results were consistent with erosive rheumatoid arthritis and osteoarthritis overlap.  She has been experiencing some increased discomfort in both feet at night.  Hammertoes were noted on examination today.  No tenderness or synovitis over MTP joints noted.  We discussed the importance of wearing proper fitting shoes and to avoid going barefoot.  Osteopenia of multiple sites: DEXA updated on 02/25/20: LFN BMD 0.707 with T-score -1.3.  She takes vitamin D 2000 units daily.  Other medical conditions are listed as follows:   History of basal cell cancer  History of squamous cell carcinoma  History of melanoma  History of hypertension: BP was 126/77 today in the office.   Anxiety  Orders: Orders Placed This Encounter  Procedures   CBC with Differential/Platelet   COMPLETE METABOLIC PANEL WITH GFR   No orders of the defined types were placed in this encounter.    Follow-Up Instructions: Return in about 5 months (around 03/13/2021) for Rheumatoid arthritis,  Osteoarthritis.   Ofilia Neas, PA-C  Note - This record has been created using Dragon software.  Chart creation errors have been sought, but may not always  have been located. Such creation errors do not reflect on  the standard of medical care.

## 2020-10-06 ENCOUNTER — Ambulatory Visit: Payer: 59 | Admitting: Rheumatology

## 2020-10-06 DIAGNOSIS — Z8679 Personal history of other diseases of the circulatory system: Secondary | ICD-10-CM

## 2020-10-06 DIAGNOSIS — M8589 Other specified disorders of bone density and structure, multiple sites: Secondary | ICD-10-CM

## 2020-10-06 DIAGNOSIS — Z8589 Personal history of malignant neoplasm of other organs and systems: Secondary | ICD-10-CM

## 2020-10-06 DIAGNOSIS — M19041 Primary osteoarthritis, right hand: Secondary | ICD-10-CM

## 2020-10-06 DIAGNOSIS — M0579 Rheumatoid arthritis with rheumatoid factor of multiple sites without organ or systems involvement: Secondary | ICD-10-CM

## 2020-10-06 DIAGNOSIS — Z79899 Other long term (current) drug therapy: Secondary | ICD-10-CM

## 2020-10-06 DIAGNOSIS — Z85820 Personal history of malignant melanoma of skin: Secondary | ICD-10-CM

## 2020-10-06 DIAGNOSIS — M19072 Primary osteoarthritis, left ankle and foot: Secondary | ICD-10-CM

## 2020-10-06 DIAGNOSIS — Z85828 Personal history of other malignant neoplasm of skin: Secondary | ICD-10-CM

## 2020-10-06 DIAGNOSIS — F419 Anxiety disorder, unspecified: Secondary | ICD-10-CM

## 2020-10-07 ENCOUNTER — Other Ambulatory Visit (HOSPITAL_COMMUNITY): Payer: Self-pay

## 2020-10-10 ENCOUNTER — Other Ambulatory Visit (HOSPITAL_COMMUNITY): Payer: Self-pay

## 2020-10-11 ENCOUNTER — Ambulatory Visit: Payer: 59 | Admitting: Physician Assistant

## 2020-10-11 ENCOUNTER — Other Ambulatory Visit: Payer: Self-pay

## 2020-10-11 ENCOUNTER — Encounter: Payer: Self-pay | Admitting: Physician Assistant

## 2020-10-11 VITALS — BP 126/77 | HR 68 | Ht 64.5 in | Wt 161.6 lb

## 2020-10-11 DIAGNOSIS — M0579 Rheumatoid arthritis with rheumatoid factor of multiple sites without organ or systems involvement: Secondary | ICD-10-CM

## 2020-10-11 DIAGNOSIS — Z79899 Other long term (current) drug therapy: Secondary | ICD-10-CM

## 2020-10-11 DIAGNOSIS — Z85820 Personal history of malignant melanoma of skin: Secondary | ICD-10-CM

## 2020-10-11 DIAGNOSIS — M8589 Other specified disorders of bone density and structure, multiple sites: Secondary | ICD-10-CM

## 2020-10-11 DIAGNOSIS — M19041 Primary osteoarthritis, right hand: Secondary | ICD-10-CM | POA: Diagnosis not present

## 2020-10-11 DIAGNOSIS — Z85828 Personal history of other malignant neoplasm of skin: Secondary | ICD-10-CM

## 2020-10-11 DIAGNOSIS — Z8589 Personal history of malignant neoplasm of other organs and systems: Secondary | ICD-10-CM

## 2020-10-11 DIAGNOSIS — M19071 Primary osteoarthritis, right ankle and foot: Secondary | ICD-10-CM

## 2020-10-11 DIAGNOSIS — F419 Anxiety disorder, unspecified: Secondary | ICD-10-CM

## 2020-10-11 DIAGNOSIS — Z8679 Personal history of other diseases of the circulatory system: Secondary | ICD-10-CM

## 2020-10-11 DIAGNOSIS — M19072 Primary osteoarthritis, left ankle and foot: Secondary | ICD-10-CM

## 2020-10-11 DIAGNOSIS — M19042 Primary osteoarthritis, left hand: Secondary | ICD-10-CM

## 2020-10-11 DIAGNOSIS — Z111 Encounter for screening for respiratory tuberculosis: Secondary | ICD-10-CM

## 2020-10-11 NOTE — Patient Instructions (Signed)

## 2020-10-12 LAB — COMPLETE METABOLIC PANEL WITH GFR
AG Ratio: 1.8 (calc) (ref 1.0–2.5)
ALT: 21 U/L (ref 6–29)
AST: 21 U/L (ref 10–35)
Albumin: 4.2 g/dL (ref 3.6–5.1)
Alkaline phosphatase (APISO): 56 U/L (ref 37–153)
BUN: 14 mg/dL (ref 7–25)
CO2: 29 mmol/L (ref 20–32)
Calcium: 9.3 mg/dL (ref 8.6–10.4)
Chloride: 104 mmol/L (ref 98–110)
Creat: 0.77 mg/dL (ref 0.50–1.05)
Globulin: 2.4 g/dL (calc) (ref 1.9–3.7)
Glucose, Bld: 95 mg/dL (ref 65–99)
Potassium: 4.2 mmol/L (ref 3.5–5.3)
Sodium: 140 mmol/L (ref 135–146)
Total Bilirubin: 0.6 mg/dL (ref 0.2–1.2)
Total Protein: 6.6 g/dL (ref 6.1–8.1)
eGFR: 88 mL/min/{1.73_m2} (ref >=60)

## 2020-10-12 LAB — CBC WITH DIFFERENTIAL/PLATELET
Absolute Monocytes: 516 cells/uL (ref 200–950)
Basophils Absolute: 52 cells/uL (ref 0–200)
Basophils Relative: 1.3 %
Eosinophils Absolute: 140 cells/uL (ref 15–500)
Eosinophils Relative: 3.5 %
HCT: 38.2 % (ref 35.0–45.0)
Hemoglobin: 12.7 g/dL (ref 11.7–15.5)
Lymphs Abs: 1176 cells/uL (ref 850–3900)
MCH: 31 pg (ref 27.0–33.0)
MCHC: 33.2 g/dL (ref 32.0–36.0)
MCV: 93.2 fL (ref 80.0–100.0)
MPV: 9.9 fL (ref 7.5–12.5)
Monocytes Relative: 12.9 %
Neutro Abs: 2116 cells/uL (ref 1500–7800)
Neutrophils Relative %: 52.9 %
Platelets: 277 10*3/uL (ref 140–400)
RBC: 4.1 10*6/uL (ref 3.80–5.10)
RDW: 12.4 % (ref 11.0–15.0)
Total Lymphocyte: 29.4 %
WBC: 4 10*3/uL (ref 3.8–10.8)

## 2020-10-12 NOTE — Progress Notes (Signed)
CBC and CMP WNL

## 2020-10-15 ENCOUNTER — Other Ambulatory Visit (HOSPITAL_COMMUNITY): Payer: Self-pay

## 2020-10-24 ENCOUNTER — Other Ambulatory Visit (HOSPITAL_COMMUNITY): Payer: Self-pay

## 2020-10-24 MED ORDER — PAROXETINE HCL 20 MG PO TABS
ORAL_TABLET | ORAL | 3 refills | Status: DC
  Filled 2020-10-24: qty 90, 90d supply, fill #0
  Filled 2021-01-17: qty 90, 90d supply, fill #1

## 2020-10-25 ENCOUNTER — Other Ambulatory Visit (HOSPITAL_COMMUNITY): Payer: Self-pay

## 2020-10-25 MED ORDER — MONTELUKAST SODIUM 10 MG PO TABS
ORAL_TABLET | ORAL | 0 refills | Status: DC
  Filled 2020-10-25: qty 30, 30d supply, fill #0

## 2020-10-27 ENCOUNTER — Other Ambulatory Visit (HOSPITAL_COMMUNITY): Payer: Self-pay

## 2020-10-27 MED FILL — Valsartan-Hydrochlorothiazide Tab 160-25 MG: ORAL | 90 days supply | Qty: 90 | Fill #1 | Status: AC

## 2020-11-03 ENCOUNTER — Other Ambulatory Visit (HOSPITAL_COMMUNITY): Payer: Self-pay

## 2020-11-04 ENCOUNTER — Other Ambulatory Visit (HOSPITAL_COMMUNITY): Payer: Self-pay

## 2020-11-05 ENCOUNTER — Other Ambulatory Visit: Payer: Self-pay | Admitting: Rheumatology

## 2020-11-05 ENCOUNTER — Other Ambulatory Visit (HOSPITAL_COMMUNITY): Payer: Self-pay

## 2020-11-06 DIAGNOSIS — T7840XA Allergy, unspecified, initial encounter: Secondary | ICD-10-CM | POA: Diagnosis not present

## 2020-11-06 DIAGNOSIS — R21 Rash and other nonspecific skin eruption: Secondary | ICD-10-CM | POA: Diagnosis not present

## 2020-11-07 MED ORDER — LEFLUNOMIDE 20 MG PO TABS
ORAL_TABLET | Freq: Every day | ORAL | 0 refills | Status: DC
  Filled 2020-11-11: qty 90, 90d supply, fill #0

## 2020-11-07 NOTE — Telephone Encounter (Signed)
Next Visit: 03/22/2021  Last Visit: 10/11/2020  Last Fill: 07/28/2020   DX: Rheumatoid arthritis involving multiple sites with positive rheumatoid factor   Current Dose per office note 10/11/2020: arava 20 mg 1 tablet by mouth daily  Labs: 10/11/2020 CBC and CMP WNL  Okay to refill Arava?

## 2020-11-11 ENCOUNTER — Other Ambulatory Visit (HOSPITAL_COMMUNITY): Payer: Self-pay

## 2020-11-12 ENCOUNTER — Other Ambulatory Visit (HOSPITAL_COMMUNITY): Payer: Self-pay

## 2020-11-15 ENCOUNTER — Other Ambulatory Visit (HOSPITAL_COMMUNITY): Payer: Self-pay

## 2020-11-28 ENCOUNTER — Other Ambulatory Visit (HOSPITAL_COMMUNITY): Payer: Self-pay

## 2020-11-28 DIAGNOSIS — Z0000 Encounter for general adult medical examination without abnormal findings: Secondary | ICD-10-CM | POA: Diagnosis not present

## 2020-11-28 MED ORDER — MONTELUKAST SODIUM 10 MG PO TABS
ORAL_TABLET | ORAL | 0 refills | Status: DC
  Filled 2020-11-28: qty 30, 30d supply, fill #0

## 2020-11-30 DIAGNOSIS — Z1211 Encounter for screening for malignant neoplasm of colon: Secondary | ICD-10-CM | POA: Diagnosis not present

## 2020-11-30 DIAGNOSIS — Z0000 Encounter for general adult medical examination without abnormal findings: Secondary | ICD-10-CM | POA: Diagnosis not present

## 2020-12-09 ENCOUNTER — Other Ambulatory Visit (HOSPITAL_COMMUNITY): Payer: Self-pay

## 2020-12-14 ENCOUNTER — Other Ambulatory Visit (HOSPITAL_COMMUNITY): Payer: Self-pay

## 2020-12-27 ENCOUNTER — Telehealth: Payer: Self-pay

## 2020-12-27 NOTE — Telephone Encounter (Signed)
She may have elbow tendinitis.  Please a schedule an appointment for evaluation.

## 2020-12-27 NOTE — Telephone Encounter (Signed)
Patient left a voicemail stating her right elbow is painful and is not sure if it is related to her RA or if she should see an orthopedic doctor.  Patient requested a return call.

## 2020-12-27 NOTE — Telephone Encounter (Signed)
Patient states her right elbow has been hurting for about 2 weeks. Patient states the pain increased when picking up a gallon of tea. Patient states she has been using Sal[place] pas patch which is not helping. Patient states she does not recall an injury. Please advise.

## 2020-12-30 ENCOUNTER — Other Ambulatory Visit (HOSPITAL_COMMUNITY): Payer: Self-pay

## 2020-12-30 MED ORDER — MONTELUKAST SODIUM 10 MG PO TABS
ORAL_TABLET | ORAL | 0 refills | Status: DC
  Filled 2020-12-30: qty 30, 30d supply, fill #0

## 2021-01-03 ENCOUNTER — Other Ambulatory Visit: Payer: Self-pay

## 2021-01-03 ENCOUNTER — Ambulatory Visit: Payer: 59 | Attending: Family Medicine | Admitting: Pharmacist

## 2021-01-03 DIAGNOSIS — Z79899 Other long term (current) drug therapy: Secondary | ICD-10-CM

## 2021-01-03 NOTE — Progress Notes (Signed)
   S: Patient presents for review of their specialty medication therapy.  Patient is currently taking Orencia for RA. Patient is managed by Dr. Estanislado Pandy for this.   Adherence: confirmed  FDA-approved dosing: SubQ: 125 mg subQ once weekly.   Dose adjustments: Renal impairment: none Hepatic impairment: none Toxicity: discontinue if serious infection develops  Drug-drug interactions: none identified   Screenings: TB screening: completed Hepatitis Screening: completed Blood glucose: Orencia contains maltose which make falsely elevate glucose levels  Monitoring: S/sx of infection: none currently  S/sx of hypersensitivity: none currently  Other adverse effects: none   O:     Lab Results  Component Value Date   WBC 4.0 10/11/2020   HGB 12.7 10/11/2020   HCT 38.2 10/11/2020   MCV 93.2 10/11/2020   PLT 277 10/11/2020      Chemistry      Component Value Date/Time   NA 140 10/11/2020 0833   K 4.2 10/11/2020 0833   CL 104 10/11/2020 0833   CO2 29 10/11/2020 0833   BUN 14 10/11/2020 0833   CREATININE 0.77 10/11/2020 0833      Component Value Date/Time   CALCIUM 9.3 10/11/2020 0833   ALKPHOS 59 10/23/2016 0905   AST 21 10/11/2020 0833   ALT 21 10/11/2020 0833   BILITOT 0.6 10/11/2020 0833     A/P: 1. Medication review: Patient currently on [place] for the treatment of rheumatoid arthritis and is tolerating it well. Reviewed the medication with the patient, including the following: Orencia is a selective T-cell costimulation blocker indicated for rheumatoid arthritis. Patient educated on purpose, proper use and potential adverse effects of Orencia. The most common adverse effects are infections, headache, and injection site reactions. There is a possible adverse effect of increased risk of malignancy but it is not fully understood if this is due to the drug or the disease state itself. The patient was instructed to avoid use of live vaccinations without the approval of a  physician. IV: Infuse over 30 minutes. Administer through a 0.2 to 1.2 micron low protein-binding filter. SubQ: Allow prefilled syringe and autoinjector to warm to room temperature (for 30 to 60 minutes and 30 minutes, respectively) prior to administration. Inject into the front of the thigh (preferred), abdomen (except for 2-inch area around the navel), or the outer area of the upper arms (if administered by a caregiver). Rotate injection sites (?1 inch apart); do not administer into tender, bruised, red, or hard skin. No recommendations for any changes.  Benard Halsted, PharmD, [place], [place] [phone]

## 2021-01-03 NOTE — Progress Notes (Signed)
Office Visit Note  Patient: Kara Campbell             Date of Birth: Oct 10, 1960           MRN: [id number]             PCP: Fanny Bien, MD Referring: Fanny Bien, MD Visit Date: 01/05/2021 Occupation: @GUAROCC @  Subjective:  Right elbow and left foot pain.   History of Present Illness: Kara Campbell is a 60 y.o. female with a history of rheumatoid arthritis and osteoarthritis.  She has been taking Orencia and [place] on a regular basis.  She states for the last 1 month she has been experiencing discomfort in her right elbow and her left foot.  He has not noticed any joint swelling.  Activities of Daily Living:  Patient reports morning stiffness for 0 minutes.   Patient Denies nocturnal pain.  Difficulty dressing/grooming: Denies Difficulty climbing stairs: Denies Difficulty getting out of chair: Denies Difficulty using hands for taps, buttons, cutlery, and/or writing: Denies  Review of Systems  Constitutional:  Negative for fatigue.  HENT:  Negative for mouth sores, mouth dryness and nose dryness.   Eyes:  Negative for pain, itching and dryness.  Respiratory:  Negative for shortness of breath and difficulty breathing.   Cardiovascular:  Negative for chest pain and palpitations.  Gastrointestinal:  Negative for blood in stool, constipation and diarrhea.  Endocrine: Negative for increased urination.  Genitourinary:  Negative for difficulty urinating.  Musculoskeletal:  Positive for joint pain and joint pain. Negative for joint swelling, myalgias, morning stiffness, muscle tenderness and myalgias.  Skin:  Negative for color change, rash and sensitivity to sunlight.  Allergic/Immunologic: Negative for susceptible to infections.  Neurological:  Negative for dizziness, numbness, headaches, memory loss and weakness.  Hematological:  Negative for bruising/bleeding tendency and swollen glands.  Psychiatric/Behavioral:  Negative for depressed mood, confusion and sleep  disturbance. The patient is not nervous/anxious.    PMFS History:  Patient Active Problem List   Diagnosis Date Noted   History of hypertension 10/16/2016   Essential hypertension 09/11/2016   Rheumatoid arthritis involving multiple sites with positive rheumatoid factor ([place]) 01/23/2016   High risk medication use 01/23/2016   Primary osteoarthritis of both hands 01/23/2016   Primary osteoarthritis of both feet 01/23/2016   Malignant melanoma ([place]) 01/23/2016   Basal cell carcinoma 01/23/2016   Anxiety 01/23/2016   Chest congestion 06/07/2015    Past Medical History:  Diagnosis Date   Arthritis    COVID-19    Melanoma ([place]) rhe   Rheumatoid arthritis(714.0)     Family History  Problem Relation Age of Onset   Cancer Mother        breast   Breast cancer Mother 1   Rheum arthritis Other    Past Surgical History:  Procedure Laterality Date   CESAREAN SECTION     INJECTION KNEE Right    Fluid removed from knee x2    MELANOMA EXCISION     SKIN BIOPSY     back of right leg, right buttocks, right chest, left cheek    Social History   Social History Narrative   Not on file   Immunization History  Administered Date(s) Administered   PFIZER(Purple Top)SARS-COV-2 Vaccination 03/01/2019, 03/22/2019, 11/24/2019     Objective: Vital Signs: BP (!) 146/85 (BP Location: Left Arm, Patient Position: Sitting, Cuff Size: Normal)   Pulse 62   Ht 5\' 5"  (1.651 m)   Wt 164 lb (  74.4 kg)   BMI 27.29 kg/m    Physical Exam Vitals and nursing note reviewed.  Constitutional:      Appearance: She is well-developed.  HENT:     Head: Normocephalic and atraumatic.  Eyes:     Conjunctiva/sclera: Conjunctivae normal.  Cardiovascular:     Rate and Rhythm: Normal rate and regular rhythm.     Heart sounds: Normal heart sounds.  Pulmonary:     Effort: Pulmonary effort is normal.     Breath sounds: Normal breath sounds.  Abdominal:     General: Bowel sounds are normal.     Palpations:  Abdomen is soft.  Musculoskeletal:     Cervical back: Normal range of motion.  Lymphadenopathy:     Cervical: No cervical adenopathy.  Skin:    General: Skin is warm and dry.     Capillary Refill: Capillary refill takes less than 2 seconds.  Neurological:     Mental Status: She is alert and oriented to person, place, and time.  Psychiatric:        Behavior: Behavior normal.     Musculoskeletal Exam: C-spine was in good range of motion.  Shoulder joints, elbow joints, wrist joints, MCPs PIPs and DIPs with good range of motion with no synovitis.  She is synovial thickening over right second MCP joint.  She had tenderness on palpation over right lateral epicondyle region consistent with lateral epicondylitis.  Hip joints and knee joints in good range of motion.  She had bilateral pes cavus and hammertoes.  She had discomfort over the left dorsal spur.  CDAI Exam: CDAI Score: 0.2  Patient Global: 1 mm; Provider Global: 1 mm Swollen: 0 ; Tender: 0  Joint Exam 01/05/2021   No joint exam has been documented for this visit   There is currently no information documented on the homunculus. Go to the Rheumatology activity and complete the homunculus joint exam.  Investigation: No additional findings.  Imaging: No results found.  Recent Labs: Lab Results  Component Value Date   WBC 4.0 10/11/2020   HGB 12.7 10/11/2020   PLT 277 10/11/2020   NA 140 10/11/2020   K 4.2 10/11/2020   CL 104 10/11/2020   CO2 29 10/11/2020   GLUCOSE 95 10/11/2020   BUN 14 10/11/2020   CREATININE 0.77 10/11/2020   BILITOT 0.6 10/11/2020   ALKPHOS 59 10/23/2016   AST 21 10/11/2020   ALT 21 10/11/2020   PROT 6.6 10/11/2020   ALBUMIN 4.2 10/23/2016   CALCIUM 9.3 10/11/2020   GFRAA 112 07/28/2020   QFTBGOLDPLUS NEGATIVE 11/11/2019    Speciality Comments: PLQ Eye exam: 04/13/19 WNL @ [facility] Follow up in 1 year Orencia started October 2021.  Plaquenil discontinued due to  improvement  Procedures:  No procedures performed Allergies: Amoxil [amoxicillin]   Assessment / Plan:     Visit Diagnoses: Rheumatoid arthritis involving multiple sites with positive rheumatoid factor ([place]) Erosive disease-she is clinically doing well on the combination of Orencia and Arava.  She had no synovitis on my examination.  She has been tolerating medications well.  High risk medication use - Orencia 125 mg subcutaneous weekly started December 18, 2019, Arava 20 mg p.o. daily.  (Plaquenil discontinued March 2022-improvement).TB Gold 11/11/2019 - Plan: CBC with Differential/Platelet, COMPLETE METABOLIC PANEL WITH GFR, QuantiFERON-TB Gold Plus today and then every 3 months to monitor for drug toxicity.  She has been advised to hold [place] in case she develops an infection.  She  may restart after the infection resolves.  Information regarding realization was placed in the AVS.  Primary osteoarthritis of both hands-joint protection muscle strengthening was discussed.  She had right second MCP thickening.  Lateral epicondylitis, right elbow-she had tenderness on palpation over right lateral epicondyle region.  A handout on exercises was given.  I advised topical Voltaren gel.  We also discussed possible use of cortisone injection in the future.  Primary osteoarthritis of both feet - X-ray showed rheumatoid arthritis and osteoarthritis overlap with erosive changes.  She has been having discomfort in the left foot over the dorsal spur.  No synovitis was noted.  Pes cavus of both feet-she is bilateral pes cavus and hammertoes which is causing discomfort.  I advised her to schedule an appointment with the podiatrist to get orthotics.  Osteopenia of multiple sites - DEXA updated on 02/25/20: LFN BMD 0.707 with T-score -1.3.  Use of calcium and vitamin D was emphasized.  Other medical problems are listed as follows:  History of melanoma  History of basal cell cancer  History of  squamous cell carcinoma  History of hypertension-blood pressure was mildly elevated today.  She did continue to monitor it.  Anxiety  Orders: Orders Placed This Encounter  Procedures   CBC with Differential/Platelet   COMPLETE METABOLIC PANEL WITH GFR   QuantiFERON-TB Gold Plus    No orders of the defined types were placed in this encounter.    Follow-Up Instructions: Return for Rheumatoid arthritis, Osteoarthritis.   Bo Merino, MD  Note - This record has been created using Editor, commissioning.  Chart creation errors have been sought, but may not always  have been located. Such creation errors do not reflect on  the standard of medical care.

## 2021-01-05 ENCOUNTER — Ambulatory Visit: Payer: 59 | Admitting: Rheumatology

## 2021-01-05 ENCOUNTER — Other Ambulatory Visit: Payer: Self-pay

## 2021-01-05 ENCOUNTER — Encounter: Payer: Self-pay | Admitting: Rheumatology

## 2021-01-05 VITALS — BP 146/85 | HR 62 | Ht 65.0 in | Wt 164.0 lb

## 2021-01-05 DIAGNOSIS — Z85828 Personal history of other malignant neoplasm of skin: Secondary | ICD-10-CM | POA: Diagnosis not present

## 2021-01-05 DIAGNOSIS — M8589 Other specified disorders of bone density and structure, multiple sites: Secondary | ICD-10-CM

## 2021-01-05 DIAGNOSIS — M0579 Rheumatoid arthritis with rheumatoid factor of multiple sites without organ or systems involvement: Secondary | ICD-10-CM | POA: Diagnosis not present

## 2021-01-05 DIAGNOSIS — M19041 Primary osteoarthritis, right hand: Secondary | ICD-10-CM | POA: Diagnosis not present

## 2021-01-05 DIAGNOSIS — M7711 Lateral epicondylitis, right elbow: Secondary | ICD-10-CM | POA: Diagnosis not present

## 2021-01-05 DIAGNOSIS — Z79899 Other long term (current) drug therapy: Secondary | ICD-10-CM

## 2021-01-05 DIAGNOSIS — Z85820 Personal history of malignant melanoma of skin: Secondary | ICD-10-CM | POA: Diagnosis not present

## 2021-01-05 DIAGNOSIS — M19071 Primary osteoarthritis, right ankle and foot: Secondary | ICD-10-CM | POA: Diagnosis not present

## 2021-01-05 DIAGNOSIS — M19072 Primary osteoarthritis, left ankle and foot: Secondary | ICD-10-CM

## 2021-01-05 DIAGNOSIS — Z8679 Personal history of other diseases of the circulatory system: Secondary | ICD-10-CM

## 2021-01-05 DIAGNOSIS — Z8589 Personal history of malignant neoplasm of other organs and systems: Secondary | ICD-10-CM

## 2021-01-05 DIAGNOSIS — Q6671 Congenital pes cavus, right foot: Secondary | ICD-10-CM | POA: Diagnosis not present

## 2021-01-05 DIAGNOSIS — F419 Anxiety disorder, unspecified: Secondary | ICD-10-CM

## 2021-01-05 DIAGNOSIS — M19042 Primary osteoarthritis, left hand: Secondary | ICD-10-CM

## 2021-01-05 DIAGNOSIS — Q6672 Congenital pes cavus, left foot: Secondary | ICD-10-CM

## 2021-01-05 NOTE — Patient Instructions (Signed)
Standing Labs We placed an order today for your standing lab work.   Please have your standing labs drawn in January and every 3 months  If possible, please have your labs drawn 2 weeks prior to your appointment so that the provider can discuss your results at your appointment.  Please note that you may see your imaging and lab results in [facility] before we have reviewed them. We may be awaiting multiple results to interpret others before contacting you. Please allow our office up to 72 hours to thoroughly review all of the results before contacting the office for clarification of your results.  We have open lab daily: Monday through Thursday from 1:30-4:30 PM and Friday from 1:30-4:00 PM at the office of Dr. Bo Merino, [facility].   Please be advised, all patients with office appointments requiring lab work will take precedent over walk-in lab work.  If possible, please come for your lab work on Monday and Friday afternoons, as you may experience shorter wait times. The office is located at [address] [place], [place], [place] [address] No appointment is necessary.   Labs are drawn by [facility]. Please bring your co-pay at the time of your lab draw.  You may receive a bill from [facility] for your lab work.  If you wish to have your labs drawn at another location, please call the office 24 hours in advance to send orders.  If you have any questions regarding directions or hours of operation,  please call [phone].   As a reminder, please drink plenty of water prior to coming for your lab work. Thanks!  Vaccines You are taking a medication(s) that can suppress your immune system.  The following immunizations are recommended: Flu annually Covid-19  Td/Tdap (tetanus, diphtheria, pertussis) every 10 years Pneumonia (Prevnar 15 then Pneumovax 23 at least 1 year apart.  Alternatively, can take Prevnar 20 without needing additional dose) Shingrix: 2 doses from 4 weeks  to 6 months apart  Please check with your PCP to make sure you are up to date.  If you have signs or symptoms of an infection or start antibiotics: First, call your PCP for workup of your infection. Hold your medication through the infection, until you complete your antibiotics, and until symptoms resolve if you take the following: Injectable medication (Actemra, Benlysta, Cimzia, Cosentyx, Enbrel, Humira, Kevzara, Orencia, Remicade, Simponi, Stelara, Taltz, Tremfya) Methotrexate Leflunomide (Arava) Mycophenolate (Cellcept) Roma Kayser, or Rinvoq   Elbow and Forearm Exercises Ask your health care provider which exercises are safe for you. Do exercises exactly as told by your health care provider and adjust them as directed. It is normal to feel mild stretching, pulling, tightness, or discomfort as you do these exercises. Stop right away if you feel sudden pain or your pain gets worse. Do not begin these exercises until told by your health care provider. Range-of-motion exercises These exercises warm up your muscles and joints and improve the movement and flexibility of your injured elbow and forearm. The exercises also help to relieve pain, numbness, and tingling. These exercises are done using the muscles in your injured elbow and forearm (active). Elbow flexion, active Hold your left / right arm at your side, and bend your elbow (flexion) as far as you can using only your arm muscles. Hold this position for __________ seconds. Slowly return to the starting position. Repeat __________ times. Complete this exercise __________ times a day. Elbow extension, active Hold your left / right arm at your side,  and straighten your elbow (extension) as much as you can using only your arm muscles. Hold this position for __________ seconds. Slowly return to the starting position. Repeat __________ times. Complete this exercise __________ times a day. Active forearm rotation, supination This is  an exercise in which you turn (rotate) your forearm palm up (supination). Stand or sit with your elbows at your sides. Bend your left / right elbow to a 90-degree angle (right angle). Rotate your palm up until you feel a gentle stretch on the inside of your forearm. Hold this position for __________ seconds. Slowly return to the starting position. Repeat __________ times. Complete this exercise __________ times a day. Active forearm rotation, pronation This is an exercise in which you turn (rotate) your forearm palm down (pronation). Stand or sit with your elbows at your sides. Bend your left / right elbow to a 90-degree angle (right angle). Rotate your palm down until you feel a gentle stretch on the top of your forearm. Hold this position for __________ seconds. Slowly return to the starting position. Repeat __________ times. Complete this exercise __________ times a day. Stretching exercises These exercises warm up your muscles and joints and improve the movement and flexibility of your injured elbow and forearm. These exercises also help to relieve pain, numbness, and tingling. These exercises are done using your healthy elbow and forearm to help stretch the muscles in your injured elbow and forearm (active-assisted). Elbow flexion, active-assisted  Hold your left / right arm at your side, and bend your elbow (flexion) as much as you can using your left / right arm muscles. Use your other hand to bend your left / right elbow farther. To do this, gently push up on your forearm until you feel a gentle stretch on the back of your elbow. Hold this position for __________ seconds. Slowly return to the starting position. Repeat __________ times. Complete this exercise __________ times a day. Elbow extension, active-assisted  Hold your left / right arm at your side, and straighten your elbow (extension) as much as you can using your left / right arm muscles. Use your other hand to straighten  the left / right elbow farther. To do this, gently push down on your forearm until you feel a gentle stretch on the inside of your elbow. Hold this position for __________ seconds. Slowly return to the starting position. Repeat __________ times. Complete this exercise __________ times a day. Active-assisted forearm rotation, supination This is an exercise in which you turn (rotate) your forearm palm up (supination). Sit with your left / right elbow bent in a 90-degree angle (right angle) with your forearm resting on a table. Keeping your upper body and shoulder still, rotate your forearm so your palm faces upward. Use your other hand to help rotate your forearm further until you feel a gentle to moderate stretch. Hold this position for __________ seconds. Slowly release the stretch and return to the starting position. Repeat __________ times. Complete this exercise __________ times a day. Active-assisted forearm rotation, pronation This is an exercise in which you turn (rotate) your forearm palm down (pronation). Sit with your left / right elbow bent in a 90-degree angle (right angle) with your forearm resting on a table. Keeping your upper body and shoulder still, rotate your forearm so your palm faces the tabletop. Use your other hand to help rotate your forearm further until you feel a gentle to moderate stretch. Hold this position for __________ seconds. Slowly release the stretch and return  to the starting position. Repeat __________ times. Complete this exercise __________ times a day. Passive elbow flexion, supine Lie on your back (supine position). Extend your left / right arm up in the air, bracing it with your other hand. Let your left / right hand slowly lower toward your shoulder (passive flexion), while your elbow stays pointed toward the ceiling. You should feel a gentle stretch along the back of your upper arm and elbow. If instructed by your health care provider, you may  increase the intensity of your stretch by adding a small wrist weight or hand weight. Hold this position for __________ seconds. Slowly return to the starting position. Repeat __________ times. Complete this exercise __________ times a day. Passive elbow extension, supine  Lie on your back (supine position). Make sure that you are in a comfortable position that lets you relax your arm muscles. Place a folded towel under your left / right upper arm so your elbow and shoulder are at the same height. Straighten your left / right arm so your elbow does not rest on the bed or towel. Let the weight of your hand stretch your elbow (passive extension). Keep your arm and chest muscles relaxed. You should feel a stretch on the inside of your elbow. If told by your health care provider, you may increase the intensity of your stretch by adding a small wrist weight or hand weight. Hold this position for __________ seconds. Slowly release the stretch. Repeat __________ times. Complete this exercise __________ times a day. Strengthening exercises These exercises build strength and endurance in your elbow and forearm. Endurance is the ability to use your muscles for a long time, even after they get tired. Elbow flexion, isometric  Stand or sit up straight. Bend your left / right elbow in a 90-degree angle (right angle), and keep your forearm at the height of your waist. Your thumb should be pointed toward the ceiling (neutral forearm). Place your other hand on top of your left / right forearm. Gently push down while you resist with your left / right arm (isometric flexion). Push as hard as you can with both arms without causing any pain or movement at your left / right elbow. Hold this position for __________ seconds. Slowly release the tension in both arms. Let your muscles relax completely before you repeat the exercise. Repeat __________ times. Complete this exercise __________ times a day. Elbow  extension, isometric  Stand or sit up straight. Place your left / right arm so your palm faces your abdomen and is at the height of your waist. Place your other hand on the underside of your left / right forearm. Gently push up while you resist with your left / right arm (isometric extension). Push as hard as you can with both arms without causing any pain or movement at your left / right elbow. Hold this position for __________ seconds. Slowly release the tension in both arms. Let your muscles relax completely before you repeat the exercise. Repeat __________ times. Complete this exercise __________ times a day. Elbow flexion with forearm palm up  Sit on a firm chair without armrests, or stand up. Place your left / right arm at your side with your elbow straight and your palm facing forward. Holding a __________weight or gripping a rubber exercise band or tubing, bend your elbow to bring your hand toward your shoulder (flexion). Hold this position for __________ seconds. Slowly return to the starting position. Repeat __________ times. Complete this exercise __________ times  a day. Elbow extension, active  Sit on a firm chair without armrests, or stand up. Hold a rubber exercise band or tubing in both hands. Keeping your upper arms at your sides, bring both hands up to your left / right shoulder. Keep your left / right hand just below your other hand. Straighten your left / right elbow (extension) while keeping your other arm still. Hold this position for __________ seconds. Control the resistance of the band or tubing as you return to the starting position. Repeat __________ times. Complete this exercise __________ times a day. Forearm rotation, supination  Sit with your left / right forearm supported on a table. Your elbow should be at waist height and bent at a 90-degree angle (right angle). Gently grasp a lightweight hammer. Rest your hand over the edge of the table with your palm  facing down. Without moving your left / right elbow, slowly rotate your forearm to turn your palm up toward the ceiling (supination). Hold this position for __________ seconds. Slowly return to the starting position. Repeat __________ times. Complete this exercise __________ times a day. Forearm rotation, pronation  Sit with your left / right forearm supported on a table. Keep your elbow below shoulder height. Gently grasp a lightweight hammer. Rest your hand over the edge of the table with your palm facing up. Without moving your left / right elbow, slowly rotate your forearm to turn your palm down toward the floor (pronation). Hold this position for __________seconds. Slowly return to the starting position. Repeat __________ times. Complete this exercise __________ times a day. This information is not intended to replace advice given to you by your health care provider. Make sure you discuss any questions you have with your health care provider. Document Revised: 06/19/2018 Document Reviewed: 03/19/2018 Elsevier Patient Education  [place].

## 2021-01-06 ENCOUNTER — Other Ambulatory Visit: Payer: Self-pay | Admitting: Physician Assistant

## 2021-01-06 ENCOUNTER — Other Ambulatory Visit (HOSPITAL_COMMUNITY): Payer: Self-pay

## 2021-01-06 MED ORDER — ORENCIA CLICKJECT 125 MG/ML ~~LOC~~ SOAJ
125.0000 mg | SUBCUTANEOUS | 0 refills | Status: DC
  Filled 2021-01-06: qty 4, 28d supply, fill #0
  Filled 2021-01-30: qty 4, 28d supply, fill #1

## 2021-01-06 NOTE — Progress Notes (Signed)
CBC and CMP are normal.

## 2021-01-06 NOTE — Telephone Encounter (Signed)
Next Visit: 03/22/2021  Last Visit: 01/05/2021  Last Fill: 09/14/2020  GU:[id number] arthritis involving multiple sites with positive rheumatoid factor   Current Dose per office note 01/05/2021: Orencia 125 mg subcutaneous weekly   Labs: 01/05/2021 CBC and CMP are normal.  TB Gold: 11/11/2019 Neg (Updated 01/05/2021, results are pending)   Okay to refill Orencia?

## 2021-01-08 LAB — COMPLETE METABOLIC PANEL WITH GFR
AG Ratio: 2.1 (calc) (ref 1.0–2.5)
ALT: 27 U/L (ref 6–29)
AST: 24 U/L (ref 10–35)
Albumin: 4.4 g/dL (ref 3.6–5.1)
Alkaline phosphatase (APISO): 54 U/L (ref 37–153)
BUN: 16 mg/dL (ref 7–25)
CO2: 30 mmol/L (ref 20–32)
Calcium: 9.5 mg/dL (ref 8.6–10.4)
Chloride: 104 mmol/L (ref 98–110)
Creat: 0.64 mg/dL (ref 0.50–1.05)
Globulin: 2.1 g/dL (calc) (ref 1.9–3.7)
Glucose, Bld: 80 mg/dL (ref 65–99)
Potassium: 4.1 mmol/L (ref 3.5–5.3)
Sodium: 140 mmol/L (ref 135–146)
Total Bilirubin: 0.5 mg/dL (ref 0.2–1.2)
Total Protein: 6.5 g/dL (ref 6.1–8.1)
eGFR: 101 mL/min/{1.73_m2} (ref >=60)

## 2021-01-08 LAB — QUANTIFERON-TB GOLD PLUS
Mitogen-NIL: 7.31 IU/mL
NIL: 0.03 IU/mL
QuantiFERON-TB Gold Plus: NEGATIVE
TB1-NIL: <0 IU/mL
TB2-NIL: <0 IU/mL

## 2021-01-08 LAB — CBC WITH DIFFERENTIAL/PLATELET
Absolute Monocytes: 598 cells/uL (ref 200–950)
Basophils Absolute: 52 cells/uL (ref 0–200)
Basophils Relative: 1.2 %
Eosinophils Absolute: 120 cells/uL (ref 15–500)
Eosinophils Relative: 2.8 %
HCT: 39.4 % (ref 35.0–45.0)
Hemoglobin: 13.1 g/dL (ref 11.7–15.5)
Lymphs Abs: 968 cells/uL (ref 850–3900)
MCH: 30.8 pg (ref 27.0–33.0)
MCHC: 33.2 g/dL (ref 32.0–36.0)
MCV: 92.7 fL (ref 80.0–100.0)
MPV: 10.1 fL (ref 7.5–12.5)
Monocytes Relative: 13.9 %
Neutro Abs: 2563 cells/uL (ref 1500–7800)
Neutrophils Relative %: 59.6 %
Platelets: 303 10*3/uL (ref 140–400)
RBC: 4.25 10*6/uL (ref 3.80–5.10)
RDW: 11.9 % (ref 11.0–15.0)
Total Lymphocyte: 22.5 %
WBC: 4.3 10*3/uL (ref 3.8–10.8)

## 2021-01-08 NOTE — Progress Notes (Signed)
TB Gold is negative.

## 2021-01-09 ENCOUNTER — Other Ambulatory Visit (HOSPITAL_COMMUNITY): Payer: Self-pay

## 2021-01-10 ENCOUNTER — Other Ambulatory Visit (HOSPITAL_COMMUNITY): Payer: Self-pay

## 2021-01-16 ENCOUNTER — Other Ambulatory Visit (HOSPITAL_COMMUNITY): Payer: Self-pay

## 2021-01-17 ENCOUNTER — Other Ambulatory Visit (HOSPITAL_COMMUNITY): Payer: Self-pay

## 2021-01-23 ENCOUNTER — Other Ambulatory Visit (HOSPITAL_COMMUNITY): Payer: Self-pay

## 2021-01-23 MED FILL — Valsartan-Hydrochlorothiazide Tab 160-25 MG: ORAL | 90 days supply | Qty: 90 | Fill #2 | Status: AC

## 2021-01-24 ENCOUNTER — Other Ambulatory Visit (HOSPITAL_COMMUNITY): Payer: Self-pay

## 2021-01-30 ENCOUNTER — Other Ambulatory Visit (HOSPITAL_COMMUNITY): Payer: Self-pay

## 2021-01-31 ENCOUNTER — Other Ambulatory Visit (HOSPITAL_COMMUNITY): Payer: Self-pay

## 2021-02-01 ENCOUNTER — Other Ambulatory Visit (HOSPITAL_COMMUNITY): Payer: Self-pay

## 2021-02-01 MED ORDER — MONTELUKAST SODIUM 10 MG PO TABS
ORAL_TABLET | ORAL | 11 refills | Status: DC
  Filled 2021-02-01: qty 30, 30d supply, fill #0
  Filled 2021-03-03: qty 30, 30d supply, fill #1
  Filled 2021-04-05: qty 30, 30d supply, fill #2
  Filled 2021-05-04: qty 30, 30d supply, fill #3
  Filled 2021-06-02: qty 90, 90d supply, fill #4
  Filled 2021-09-06: qty 90, 90d supply, fill #5
  Filled 2021-12-04: qty 30, 30d supply, fill #6
  Filled 2022-01-05: qty 30, 30d supply, fill #7

## 2021-02-06 ENCOUNTER — Other Ambulatory Visit (HOSPITAL_COMMUNITY): Payer: Self-pay

## 2021-02-13 ENCOUNTER — Other Ambulatory Visit (HOSPITAL_COMMUNITY): Payer: Self-pay

## 2021-02-14 ENCOUNTER — Other Ambulatory Visit: Payer: Self-pay | Admitting: Physician Assistant

## 2021-02-14 ENCOUNTER — Other Ambulatory Visit (HOSPITAL_COMMUNITY): Payer: Self-pay

## 2021-02-14 MED ORDER — LEFLUNOMIDE 20 MG PO TABS
ORAL_TABLET | Freq: Every day | ORAL | 0 refills | Status: DC
  Filled 2021-02-14: qty 90, 90d supply, fill #0

## 2021-02-14 NOTE — Telephone Encounter (Signed)
Next Visit: 03/22/2021  Last Visit: 01/05/2021  Last Fill: 11/07/2020  DX:  Rheumatoid arthritis involving multiple sites with positive rheumatoid factor   Current Dose per office note 01/05/2021: Arava 20 mg p.o. daily  Labs: 01/05/2021 CBC and CMP are normal.  Okay to refill Arava?

## 2021-02-15 ENCOUNTER — Telehealth: Payer: Self-pay

## 2021-02-15 ENCOUNTER — Other Ambulatory Visit (HOSPITAL_COMMUNITY): Payer: Self-pay

## 2021-02-15 NOTE — Telephone Encounter (Signed)
Patient advised the symptoms patient is describing appear to be consistent with tendinitis of the elbow.  She had right lateral epicondylitis during her last visit in October.  Patient we can refer her to physical therapy.  If her symptoms persist despite of physical therapy a cortisone injection can be considered.  First-line treatment is physical therapy. Patient declined referral to physical therapy at this time. Patient states she has the at home exercises she was given at there last visit and will continue doing those. She states she wanted to make sure it was not an issue with her rheumatoid arthritis. Patient will contact the office if she changes her mind regarding the PT referral.

## 2021-02-15 NOTE — Telephone Encounter (Signed)
Patient called stating she saw Dr. Estanislado Pandy last month for right elbow pain and now her left elbow is hurting.  Patient states she has never had pain in her elbows before and requested a return call to let her know if she needs to have x-rays.  Please advise.

## 2021-02-15 NOTE — Telephone Encounter (Signed)
Patient states when she was in the office a few weeks ago her right elbow was hurting. Patient states for the last week and a half she has had pain in her left elbow. Patient states she has not done anything unusual. Patient states when she goes to pick up something it will hurt. Patient states the pain is just like the pain in her right elbow. Patient states she was concerned. Patient states she is unsure that she would develop tendonitis in both elbows. Patient is on [place] and denies missing any doses. Please advise.

## 2021-02-15 NOTE — Telephone Encounter (Signed)
The symptoms patient is describing appear to be consistent with tendinitis of the elbow.  She had right lateral epicondylitis during her last visit in October.  We can refer her to physical therapy.  If her symptoms persist despite of physical therapy a cortisone injection can be considered.  First-line treatment is physical therapy.

## 2021-02-16 ENCOUNTER — Other Ambulatory Visit: Payer: Self-pay | Admitting: Pharmacist

## 2021-02-16 ENCOUNTER — Other Ambulatory Visit (HOSPITAL_COMMUNITY): Payer: Self-pay

## 2021-02-16 MED ORDER — ORENCIA CLICKJECT 125 MG/ML ~~LOC~~ SOAJ
125.0000 mg | SUBCUTANEOUS | 0 refills | Status: DC
Start: 2021-02-16 — End: 2021-05-23
  Filled 2021-02-16: qty 12, 84d supply, fill #0
  Filled 2021-03-02: qty 4, 28d supply, fill #0
  Filled 2021-03-29: qty 4, 28d supply, fill #1
  Filled 2021-04-26: qty 4, 28d supply, fill #2

## 2021-02-20 ENCOUNTER — Other Ambulatory Visit (HOSPITAL_COMMUNITY): Payer: Self-pay

## 2021-03-01 ENCOUNTER — Other Ambulatory Visit (HOSPITAL_COMMUNITY): Payer: Self-pay

## 2021-03-02 ENCOUNTER — Other Ambulatory Visit (HOSPITAL_COMMUNITY): Payer: Self-pay

## 2021-03-03 ENCOUNTER — Other Ambulatory Visit (HOSPITAL_COMMUNITY): Payer: Self-pay

## 2021-03-07 ENCOUNTER — Other Ambulatory Visit (HOSPITAL_COMMUNITY): Payer: Self-pay

## 2021-03-08 ENCOUNTER — Other Ambulatory Visit (HOSPITAL_COMMUNITY): Payer: Self-pay

## 2021-03-08 NOTE — Progress Notes (Signed)
Office Visit Note  Patient: Kara Campbell             Date of Birth: [birth year]           MRN: [id number]             PCP: Fanny Bien, MD Referring: Fanny Bien, MD Visit Date: 03/22/2021 Occupation: @GUAROCC @  Subjective:  Medication management.   History of Present Illness: Kara Campbell is a 60 y.o. female with history of seropositive rheumatoid arthritis and osteoarthritis.  She denies any joint pain or joint swelling.  She has been tolerating Orencia and leflunomide without any side effects.  She is off-and-on discomfort in her left lateral epicondyle region.  It is doing better since the last visit.  She is planning to schedule an appointment with the podiatrist regarding hammertoes.  She also has an appointment coming up with Dr. Ubaldo Glassing for skin check.  Activities of Daily Living:  Patient reports morning stiffness for 0 minutes.   Patient Denies nocturnal pain.  Difficulty dressing/grooming: Denies Difficulty climbing stairs: Denies Difficulty getting out of chair: Denies Difficulty using hands for taps, buttons, cutlery, and/or writing: Denies  Review of Systems  Constitutional:  Positive for fatigue.  HENT:  Negative for mouth sores, mouth dryness and nose dryness.   Eyes:  Negative for pain, itching and dryness.  Respiratory:  Negative for shortness of breath and difficulty breathing.   Cardiovascular:  Negative for chest pain and palpitations.  Gastrointestinal:  Negative for blood in stool, constipation and diarrhea.  Endocrine: Negative for increased urination.  Genitourinary:  Negative for difficulty urinating.  Musculoskeletal:  Negative for joint pain, joint pain, joint swelling, myalgias, morning stiffness, muscle tenderness and myalgias.  Skin:  Negative for color change, rash and redness.  Allergic/Immunologic: Negative for susceptible to infections.  Neurological:  Negative for dizziness, numbness, headaches, memory loss and weakness.   Hematological:  Negative for bruising/bleeding tendency.  Psychiatric/Behavioral:  Negative for confusion.    PMFS History:  Patient Active Problem List   Diagnosis Date Noted   History of hypertension 10/16/2016   Essential hypertension 09/11/2016   Rheumatoid arthritis involving multiple sites with positive rheumatoid factor ([facility]) 01/23/2016   High risk medication use 01/23/2016   Primary osteoarthritis of both hands 01/23/2016   Primary osteoarthritis of both feet 01/23/2016   Malignant melanoma ([facility]) 01/23/2016   Basal cell carcinoma 01/23/2016   Anxiety 01/23/2016   Chest congestion 06/07/2015    Past Medical History:  Diagnosis Date   Arthritis    COVID-19    Melanoma ([place]) rhe   Rheumatoid arthritis(714.0)     Family History  Problem Relation Age of Onset   Cancer Mother        breast   Breast cancer Mother 79   Rheum arthritis Other    Past Surgical History:  Procedure Laterality Date   CESAREAN SECTION     INJECTION KNEE Right    Fluid removed from knee x2    MELANOMA EXCISION     SKIN BIOPSY     back of right leg, right buttocks, right chest, left cheek    Social History   Social History Narrative   Not on file   Immunization History  Administered Date(s) Administered   PFIZER(Purple Top)SARS-COV-2 Vaccination 03/01/2019, 03/22/2019, 11/24/2019     Objective: Vital Signs: BP (!) 143/90 (BP Location: Right Arm, Patient Position: Sitting, Cuff Size: Normal)    Pulse 65    Ht 5\' 5"  (  1.651 m)    Wt 161 lb 12.8 oz (73.4 kg)    BMI 26.92 kg/m    Physical Exam Vitals and nursing note reviewed.  Constitutional:      Appearance: She is well-developed.  HENT:     Head: Normocephalic and atraumatic.  Eyes:     Conjunctiva/sclera: Conjunctivae normal.  Cardiovascular:     Rate and Rhythm: Normal rate and regular rhythm.     Heart sounds: Normal heart sounds.  Pulmonary:     Effort: Pulmonary effort is normal.     Breath sounds: Normal breath sounds.   Abdominal:     General: Bowel sounds are normal.     Palpations: Abdomen is soft.  Musculoskeletal:     Cervical back: Normal range of motion.  Lymphadenopathy:     Cervical: No cervical adenopathy.  Skin:    General: Skin is warm and dry.     Capillary Refill: Capillary refill takes less than 2 seconds.  Neurological:     Mental Status: She is alert and oriented to person, place, and time.  Psychiatric:        Behavior: Behavior normal.     Musculoskeletal Exam: C-spine was in good range of motion.  Shoulder joints, elbow joints wrist joints, MCPs PIPs PIPs and DIPs with good range of motion.  She had MCP thickening but no synovitis.  She had DIP and PIP prominence.  Hip joints and knee joints in good range of motion with no synovitis.  She had bilateral pes cavus and hammertoes.  CDAI Exam: CDAI Score: 0.2  Patient Global: 1 mm; Provider Global: 1 mm Swollen: 0 ; Tender: 0  Joint Exam 03/22/2021   No joint exam has been documented for this visit   There is currently no information documented on the homunculus. Go to the Rheumatology activity and complete the homunculus joint exam.  Investigation: No additional findings.  Imaging: No results found.  Recent Labs: Lab Results  Component Value Date   WBC 4.3 01/05/2021   HGB 13.1 01/05/2021   PLT 303 01/05/2021   NA 140 01/05/2021   K 4.1 01/05/2021   CL 104 01/05/2021   CO2 30 01/05/2021   GLUCOSE 80 01/05/2021   BUN 16 01/05/2021   CREATININE 0.64 01/05/2021   BILITOT 0.5 01/05/2021   ALKPHOS 59 10/23/2016   AST 24 01/05/2021   ALT 27 01/05/2021   PROT 6.5 01/05/2021   ALBUMIN 4.2 10/23/2016   CALCIUM 9.5 01/05/2021   GFRAA 112 07/28/2020   QFTBGOLDPLUS NEGATIVE 01/05/2021    Speciality Comments: PLQ Eye exam: 04/13/19 WNL @ [facility] Follow up in 1 year Orencia started October 2021.  Plaquenil discontinued due to improvement  Procedures:  No procedures performed Allergies: Amoxil [amoxicillin]    Assessment / Plan:     Visit Diagnoses: Rheumatoid arthritis involving multiple sites with positive rheumatoid factor (HCC) Erosive disease - Erosive disease-she had no synovitis on examination.  She denies any history of joint swelling or joint pain.  She has been tolerating medications well.  We will repeat x-rays in August 2023.  High risk medication use - Orencia 125 mg subcutaneous weekly started December 18, 2019, Arava 20 mg p.o. daily.  (Plaquenil discontinued March 2022-improvement). - Plan: CBC with Differential/Platelet, COMPLETE METABOLIC PANEL WITH GFR today and then every 3 months to monitor for drug toxicity.  Her labs from January 05, 2021 which included CBC with differential, CMP with GFR and TB Gold were negative.  Information  right immunization was placed in the AVS.  She has been advised to hold Orencia and reevaluate in case she develops an infection and resume after the infection resolves.  Primary osteoarthritis of both hands-joint protection muscle strengthening was discussed.  Lateral epicondylitis, right elbow-she has intermittent symptoms.  I offered a tennis elbow brace which she declined.  She was given a handout on exercises which she will continue.  Use of topical Voltaren gel was also discussed.  Primary osteoarthritis of both feet - X-ray showed rheumatoid arthritis and osteoarthritis overlap with erosive changes.  She had no synovitis on examination.  Hammertoes was noted.  Pes cavus of both feet-wearing shoes with arch support were discussed.  She is planning to schedule an appoint with a podiatrist for orthotics.  Osteopenia of multiple sites - DEXA updated on 02/25/20: LFN BMD 0.707 with T-score -1.3.  Use of calcium rich diet and vitamin D supplement was discussed.  History of melanoma-she is followed by oncology and dermatology.  History of hypertension-blood pressure was elevated today.  She has been monitoring her blood pressure.  History of basal cell  cancer  History of squamous cell carcinoma  Anxiety  Orders: Orders Placed This Encounter  Procedures   CBC with Differential/Platelet   COMPLETE METABOLIC PANEL WITH GFR   No orders of the defined types were placed in this encounter.    Follow-Up Instructions: Return in about 5 months (around 08/20/2021) for Rheumatoid arthritis.   Bo Merino, MD  Note - This record has been created using Editor, commissioning.  Chart creation errors have been sought, but may not always  have been located. Such creation errors do not reflect on  the standard of medical care.

## 2021-03-09 ENCOUNTER — Other Ambulatory Visit (HOSPITAL_COMMUNITY): Payer: Self-pay

## 2021-03-22 ENCOUNTER — Other Ambulatory Visit: Payer: Self-pay

## 2021-03-22 ENCOUNTER — Encounter: Payer: Self-pay | Admitting: Rheumatology

## 2021-03-22 ENCOUNTER — Ambulatory Visit: Payer: 59 | Admitting: Rheumatology

## 2021-03-22 VITALS — BP 143/90 | HR 65 | Ht 65.0 in | Wt 161.8 lb

## 2021-03-22 DIAGNOSIS — Q6671 Congenital pes cavus, right foot: Secondary | ICD-10-CM

## 2021-03-22 DIAGNOSIS — M19072 Primary osteoarthritis, left ankle and foot: Secondary | ICD-10-CM

## 2021-03-22 DIAGNOSIS — M8589 Other specified disorders of bone density and structure, multiple sites: Secondary | ICD-10-CM | POA: Diagnosis not present

## 2021-03-22 DIAGNOSIS — Z79899 Other long term (current) drug therapy: Secondary | ICD-10-CM | POA: Diagnosis not present

## 2021-03-22 DIAGNOSIS — Z85820 Personal history of malignant melanoma of skin: Secondary | ICD-10-CM

## 2021-03-22 DIAGNOSIS — Z8589 Personal history of malignant neoplasm of other organs and systems: Secondary | ICD-10-CM

## 2021-03-22 DIAGNOSIS — M19041 Primary osteoarthritis, right hand: Secondary | ICD-10-CM

## 2021-03-22 DIAGNOSIS — Z8679 Personal history of other diseases of the circulatory system: Secondary | ICD-10-CM

## 2021-03-22 DIAGNOSIS — M7711 Lateral epicondylitis, right elbow: Secondary | ICD-10-CM | POA: Diagnosis not present

## 2021-03-22 DIAGNOSIS — M19071 Primary osteoarthritis, right ankle and foot: Secondary | ICD-10-CM | POA: Diagnosis not present

## 2021-03-22 DIAGNOSIS — F419 Anxiety disorder, unspecified: Secondary | ICD-10-CM

## 2021-03-22 DIAGNOSIS — M0579 Rheumatoid arthritis with rheumatoid factor of multiple sites without organ or systems involvement: Secondary | ICD-10-CM

## 2021-03-22 DIAGNOSIS — Z85828 Personal history of other malignant neoplasm of skin: Secondary | ICD-10-CM

## 2021-03-22 DIAGNOSIS — Q6672 Congenital pes cavus, left foot: Secondary | ICD-10-CM

## 2021-03-22 DIAGNOSIS — M19042 Primary osteoarthritis, left hand: Secondary | ICD-10-CM

## 2021-03-22 LAB — COMPLETE METABOLIC PANEL WITH GFR
AG Ratio: 2 (calc) (ref 1.0–2.5)
ALT: 39 U/L — ABNORMAL HIGH (ref 6–29)
AST: 28 U/L (ref 10–35)
Albumin: 4.6 g/dL (ref 3.6–5.1)
Alkaline phosphatase (APISO): 61 U/L (ref 37–153)
BUN: 11 mg/dL (ref 7–25)
CO2: 33 mmol/L — ABNORMAL HIGH (ref 20–32)
Calcium: 9.9 mg/dL (ref 8.6–10.4)
Chloride: 101 mmol/L (ref 98–110)
Creat: 0.7 mg/dL (ref 0.50–1.05)
Globulin: 2.3 g/dL (calc) (ref 1.9–3.7)
Glucose, Bld: 93 mg/dL (ref 65–99)
Potassium: 4.1 mmol/L (ref 3.5–5.3)
Sodium: 140 mmol/L (ref 135–146)
Total Bilirubin: 0.7 mg/dL (ref 0.2–1.2)
Total Protein: 6.9 g/dL (ref 6.1–8.1)
eGFR: 99 mL/min/{1.73_m2} (ref >=60)

## 2021-03-22 LAB — CBC WITH DIFFERENTIAL/PLATELET
Absolute Monocytes: 554 cells/uL (ref 200–950)
Basophils Absolute: 59 cells/uL (ref 0–200)
Basophils Relative: 1.2 %
Eosinophils Absolute: 152 cells/uL (ref 15–500)
Eosinophils Relative: 3.1 %
HCT: 41.2 % (ref 35.0–45.0)
Hemoglobin: 13.7 g/dL (ref 11.7–15.5)
Lymphs Abs: 1132 cells/uL (ref 850–3900)
MCH: 30.9 pg (ref 27.0–33.0)
MCHC: 33.3 g/dL (ref 32.0–36.0)
MCV: 93 fL (ref 80.0–100.0)
MPV: 10.2 fL (ref 7.5–12.5)
Monocytes Relative: 11.3 %
Neutro Abs: 3004 cells/uL (ref 1500–7800)
Neutrophils Relative %: 61.3 %
Platelets: 313 10*3/uL (ref 140–400)
RBC: 4.43 10*6/uL (ref 3.80–5.10)
RDW: 12 % (ref 11.0–15.0)
Total Lymphocyte: 23.1 %
WBC: 4.9 10*3/uL (ref 3.8–10.8)

## 2021-03-22 NOTE — Patient Instructions (Signed)
Standing Labs We placed an order today for your standing lab work.   Please have your standing labs drawn in April and every 3 months  If possible, please have your labs drawn 2 weeks prior to your appointment so that the provider can discuss your results at your appointment.  Please note that you may see your imaging and lab results in [place] before we have reviewed them. We may be awaiting multiple results to interpret others before contacting you. Please allow our office up to 72 hours to thoroughly review all of the results before contacting the office for clarification of your results.  We have open lab daily: Monday through Thursday from 1:30-4:30 PM and Friday from 1:30-4:00 PM at the office of Dr. Bo Merino, [facility].   Please be advised, all patients with office appointments requiring lab work will take precedent over walk-in lab work.  If possible, please come for your lab work on Monday and Friday afternoons, as you may experience shorter wait times. The office is located at [address] [place], [place], [place] [address] No appointment is necessary.   Labs are drawn by [facility]. Please bring your co-pay at the time of your lab draw.  You may receive a bill from [place] for your lab work.  If you wish to have your labs drawn at another location, please call the office 24 hours in advance to send orders.  If you have any questions regarding directions or hours of operation,  please call [phone].   As a reminder, please drink plenty of water prior to coming for your lab work. Thanks!   Vaccines You are taking a medication(s) that can suppress your immune system.  The following immunizations are recommended: Flu annually Covid-19  Td/Tdap (tetanus, diphtheria, pertussis) every 10 years Pneumonia (Prevnar 15 then Pneumovax 23 at least 1 year apart.  Alternatively, can take Prevnar 20 without needing additional dose) Shingrix: 2 doses from 4 weeks  to 6 months apart  Please check with your PCP to make sure you are up to date.   If you have signs or symptoms of an infection or start antibiotics: First, call your PCP for workup of your infection. Hold your medication through the infection, until you complete your antibiotics, and until symptoms resolve if you take the following: Injectable medication (Actemra, Benlysta, Cimzia, Cosentyx, Enbrel, Humira, Kevzara, Orencia, Remicade, Simponi, Stelara, Taltz, Tremfya) Methotrexate Leflunomide (Arava) Mycophenolate (Cellcept) Morrie Sheldon, Olumiant, or Rinvoq

## 2021-03-23 DIAGNOSIS — D485 Neoplasm of uncertain behavior of skin: Secondary | ICD-10-CM | POA: Diagnosis not present

## 2021-03-23 DIAGNOSIS — L570 Actinic keratosis: Secondary | ICD-10-CM | POA: Diagnosis not present

## 2021-03-23 DIAGNOSIS — D2271 Melanocytic nevi of right lower limb, including hip: Secondary | ICD-10-CM | POA: Diagnosis not present

## 2021-03-23 DIAGNOSIS — D2272 Melanocytic nevi of left lower limb, including hip: Secondary | ICD-10-CM | POA: Diagnosis not present

## 2021-03-23 DIAGNOSIS — D225 Melanocytic nevi of trunk: Secondary | ICD-10-CM | POA: Diagnosis not present

## 2021-03-23 DIAGNOSIS — L821 Other seborrheic keratosis: Secondary | ICD-10-CM | POA: Diagnosis not present

## 2021-03-23 DIAGNOSIS — Z85820 Personal history of malignant melanoma of skin: Secondary | ICD-10-CM | POA: Diagnosis not present

## 2021-03-23 DIAGNOSIS — Z85828 Personal history of other malignant neoplasm of skin: Secondary | ICD-10-CM | POA: Diagnosis not present

## 2021-03-23 DIAGNOSIS — L814 Other melanin hyperpigmentation: Secondary | ICD-10-CM | POA: Diagnosis not present

## 2021-03-23 NOTE — Progress Notes (Signed)
CBC is normal.  ALT is mildly elevated.  Please advise patient to avoid NSAIDs, Tylenol and alcohol use.  We will continue to monitor labs.

## 2021-03-29 ENCOUNTER — Other Ambulatory Visit (HOSPITAL_COMMUNITY): Payer: Self-pay

## 2021-04-05 ENCOUNTER — Other Ambulatory Visit (HOSPITAL_COMMUNITY): Payer: Self-pay

## 2021-04-25 ENCOUNTER — Other Ambulatory Visit (HOSPITAL_COMMUNITY): Payer: Self-pay

## 2021-04-26 ENCOUNTER — Other Ambulatory Visit (HOSPITAL_COMMUNITY): Payer: Self-pay

## 2021-04-26 MED ORDER — PAROXETINE HCL 20 MG PO TABS
20.0000 mg | ORAL_TABLET | Freq: Every morning | ORAL | 3 refills | Status: DC
  Filled 2021-04-26: qty 90, 90d supply, fill #0
  Filled 2021-07-27: qty 90, 90d supply, fill #1
  Filled 2021-10-25: qty 90, 90d supply, fill #2
  Filled 2022-01-24: qty 90, 90d supply, fill #3

## 2021-04-27 ENCOUNTER — Other Ambulatory Visit (HOSPITAL_COMMUNITY): Payer: Self-pay

## 2021-04-28 ENCOUNTER — Other Ambulatory Visit (HOSPITAL_COMMUNITY): Payer: Self-pay

## 2021-05-01 ENCOUNTER — Other Ambulatory Visit (HOSPITAL_COMMUNITY): Payer: Self-pay

## 2021-05-02 ENCOUNTER — Other Ambulatory Visit (HOSPITAL_COMMUNITY): Payer: Self-pay

## 2021-05-02 MED ORDER — VALSARTAN-HYDROCHLOROTHIAZIDE 160-25 MG PO TABS
ORAL_TABLET | ORAL | 0 refills | Status: DC
  Filled 2021-05-02: qty 90, 90d supply, fill #0

## 2021-05-03 ENCOUNTER — Other Ambulatory Visit (HOSPITAL_COMMUNITY): Payer: Self-pay

## 2021-05-04 ENCOUNTER — Other Ambulatory Visit (HOSPITAL_COMMUNITY): Payer: Self-pay

## 2021-05-17 ENCOUNTER — Other Ambulatory Visit (HOSPITAL_COMMUNITY): Payer: Self-pay

## 2021-05-17 ENCOUNTER — Other Ambulatory Visit: Payer: Self-pay | Admitting: Physician Assistant

## 2021-05-17 MED ORDER — LEFLUNOMIDE 20 MG PO TABS
ORAL_TABLET | Freq: Every day | ORAL | 0 refills | Status: DC
  Filled 2021-05-17: qty 90, 90d supply, fill #0

## 2021-05-17 NOTE — Telephone Encounter (Signed)
Next Visit: 08/21/2021 ? ?Last Visit: 03/22/2021 ? ?Labs: 03/22/2021 CBC is normal.  ALT is mildly elevated.  ? ?Current Dose per office note 03/22/2021: Arava 20 mg p.o. daily ? ?DX: Rheumatoid arthritis involving multiple sites with positive rheumatoid factor  ? ?Last Fill: 02/14/2021 ? ?Okay to refill Arava?  ?

## 2021-05-18 ENCOUNTER — Other Ambulatory Visit (HOSPITAL_COMMUNITY): Payer: Self-pay

## 2021-05-23 ENCOUNTER — Other Ambulatory Visit (HOSPITAL_COMMUNITY): Payer: Self-pay

## 2021-05-23 ENCOUNTER — Other Ambulatory Visit: Payer: Self-pay | Admitting: Pharmacist

## 2021-05-23 ENCOUNTER — Other Ambulatory Visit: Payer: Self-pay | Admitting: Rheumatology

## 2021-05-23 MED ORDER — ORENCIA CLICKJECT 125 MG/ML ~~LOC~~ SOAJ
125.0000 mg | SUBCUTANEOUS | 0 refills | Status: DC
  Filled 2021-05-23: qty 12, 84d supply, fill #0

## 2021-05-23 MED ORDER — ORENCIA CLICKJECT 125 MG/ML ~~LOC~~ SOAJ
125.0000 mg | SUBCUTANEOUS | 0 refills | Status: DC
  Filled 2021-05-23: qty 4, 28d supply, fill #0
  Filled 2021-06-23 – 2021-07-03 (×2): qty 4, 28d supply, fill #1
  Filled 2021-07-27: qty 4, 28d supply, fill #2

## 2021-05-23 NOTE — Telephone Encounter (Signed)
Next Visit: 08/21/2021 ?  ?Last Visit: 03/22/2021 ?  ?Labs: 03/22/2021 CBC is normal.  ALT is mildly elevated.  ? ?TB Gold: 01/05/2021 Neg ?  ?Current Dose per office note 03/22/2021: Orencia 125 mg subcutaneous weekly ? ?DX: Rheumatoid arthritis involving multiple sites with positive rheumatoid factor  ?  ?Last Fill: 02/16/2021 ? ?Okay to refill Orencia?  ?

## 2021-05-29 ENCOUNTER — Telehealth: Payer: Self-pay

## 2021-05-29 ENCOUNTER — Other Ambulatory Visit (HOSPITAL_COMMUNITY): Payer: Self-pay

## 2021-05-29 NOTE — Telephone Encounter (Signed)
Received notification from [facility] stating that a new PA was required. ? ?Submitted a Prior Authorization request to [facility] for [facility] via CoverMyMeds. Will update once we receive a response. ? ? ?Key: [id number] ?

## 2021-06-01 ENCOUNTER — Other Ambulatory Visit (HOSPITAL_COMMUNITY): Payer: Self-pay

## 2021-06-01 NOTE — Telephone Encounter (Signed)
Received notification from [facility] regarding a prior authorization for [facility]. Authorization has been APPROVED from 05/31/2021 to 05/31/2022. Approval letter sent to scan center. Updated [employer] with current Prior Authorization information. ? ?Authorization # [id number] ? ?

## 2021-06-01 NOTE — Telephone Encounter (Signed)
Received notification from [facility] regarding a prior authorization for [facility]. Authorization has been APPROVED from 05/31/21 to 05/31/22.  ? ?Patient must continue to fill through [place]: [phone]  ? ?Authorization # [id number] ? ?Knox Saliva, PharmD, MPH, BCPS ?Clinical Pharmacist (Rheumatology and Pulmonology) ?

## 2021-06-02 ENCOUNTER — Other Ambulatory Visit (HOSPITAL_COMMUNITY): Payer: Self-pay

## 2021-06-09 ENCOUNTER — Other Ambulatory Visit (HOSPITAL_COMMUNITY): Payer: Self-pay

## 2021-06-23 ENCOUNTER — Other Ambulatory Visit (HOSPITAL_COMMUNITY): Payer: Self-pay

## 2021-06-27 ENCOUNTER — Other Ambulatory Visit (HOSPITAL_COMMUNITY): Payer: Self-pay

## 2021-06-27 DIAGNOSIS — G4700 Insomnia, unspecified: Secondary | ICD-10-CM | POA: Diagnosis not present

## 2021-06-27 DIAGNOSIS — F411 Generalized anxiety disorder: Secondary | ICD-10-CM | POA: Diagnosis not present

## 2021-06-27 DIAGNOSIS — I10 Essential (primary) hypertension: Secondary | ICD-10-CM | POA: Diagnosis not present

## 2021-06-27 DIAGNOSIS — J309 Allergic rhinitis, unspecified: Secondary | ICD-10-CM | POA: Diagnosis not present

## 2021-06-27 DIAGNOSIS — F331 Major depressive disorder, recurrent, moderate: Secondary | ICD-10-CM | POA: Diagnosis not present

## 2021-06-27 DIAGNOSIS — M05742 Rheumatoid arthritis with rheumatoid factor of left hand without organ or systems involvement: Secondary | ICD-10-CM | POA: Diagnosis not present

## 2021-06-27 DIAGNOSIS — M05741 Rheumatoid arthritis with rheumatoid factor of right hand without organ or systems involvement: Secondary | ICD-10-CM | POA: Diagnosis not present

## 2021-06-27 MED ORDER — VALSARTAN-HYDROCHLOROTHIAZIDE 160-25 MG PO TABS
ORAL_TABLET | ORAL | 3 refills | Status: DC
  Filled 2021-07-27: qty 90, 90d supply, fill #0
  Filled 2021-10-25: qty 90, 90d supply, fill #1
  Filled 2022-01-24: qty 90, 90d supply, fill #2
  Filled 2022-05-02: qty 90, 90d supply, fill #3

## 2021-06-27 MED ORDER — PAROXETINE HCL 20 MG PO TABS
ORAL_TABLET | ORAL | 3 refills | Status: DC
  Filled 2022-04-28: qty 90, 90d supply, fill #0

## 2021-06-27 MED ORDER — MONTELUKAST SODIUM 10 MG PO TABS
10.0000 mg | ORAL_TABLET | Freq: Every day | ORAL | 3 refills | Status: DC
  Filled 2021-06-27 – 2022-02-24 (×2): qty 90, 90d supply, fill #0

## 2021-07-03 ENCOUNTER — Other Ambulatory Visit (HOSPITAL_COMMUNITY): Payer: Self-pay

## 2021-07-11 ENCOUNTER — Other Ambulatory Visit (HOSPITAL_COMMUNITY): Payer: Self-pay

## 2021-07-25 ENCOUNTER — Other Ambulatory Visit (HOSPITAL_COMMUNITY): Payer: Self-pay

## 2021-07-27 ENCOUNTER — Other Ambulatory Visit (HOSPITAL_COMMUNITY): Payer: Self-pay

## 2021-08-02 ENCOUNTER — Other Ambulatory Visit (HOSPITAL_COMMUNITY): Payer: Self-pay

## 2021-08-13 IMAGING — MG MM DIGITAL DIAGNOSTIC BILAT W/ TOMO W/ CAD
8 series · 8 of 24 positions shown · non-contrast
Comparison: Previous exam(s).

CLINICAL DATA: 58-year-old female with intermittent nonfocal
tenderness in the right breast. Patient has a strong family history
of breast cancer, with her mother initially diagnosed with left
breast cancer in her 60s and subsequently diagnosed with cancer in
her right breast.

EXAM:
DIGITAL DIAGNOSTIC BILATERAL MAMMOGRAM WITH CAD AND TOMO

[L MLO synth-2D]
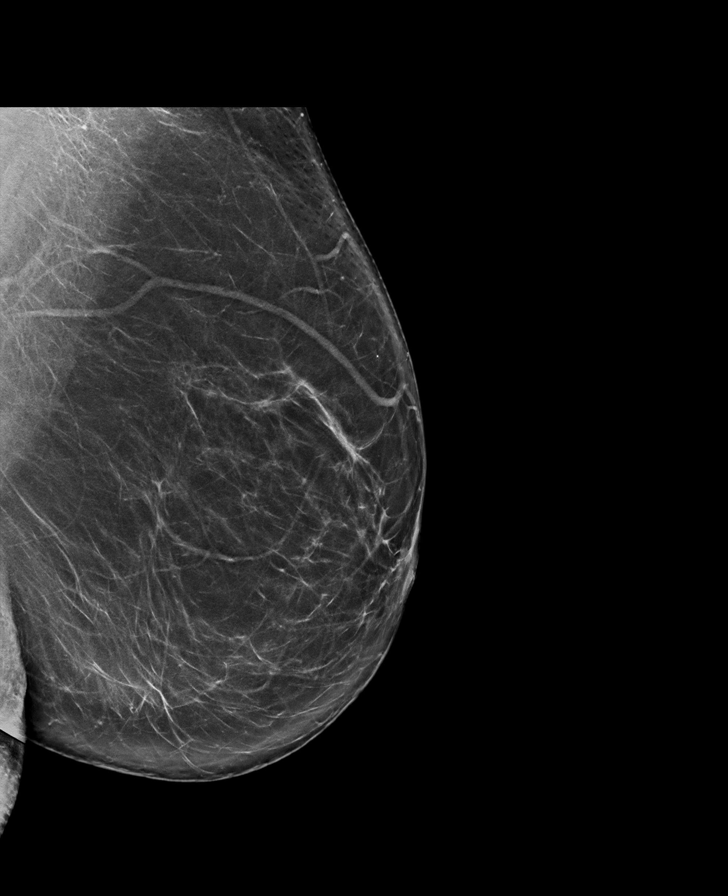

[R CC synth-2D]
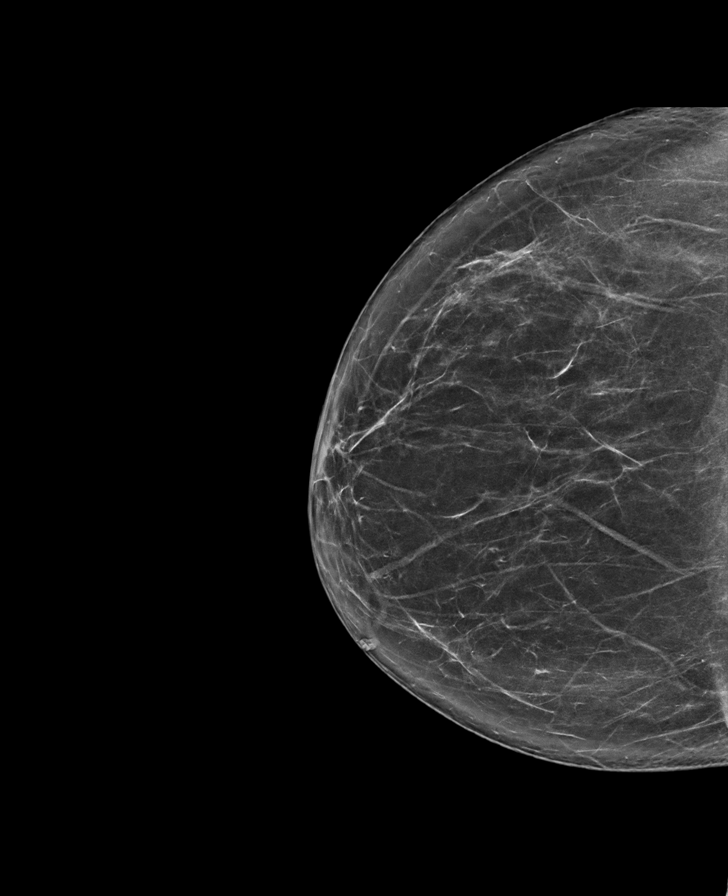

[R MLO synth-2D]
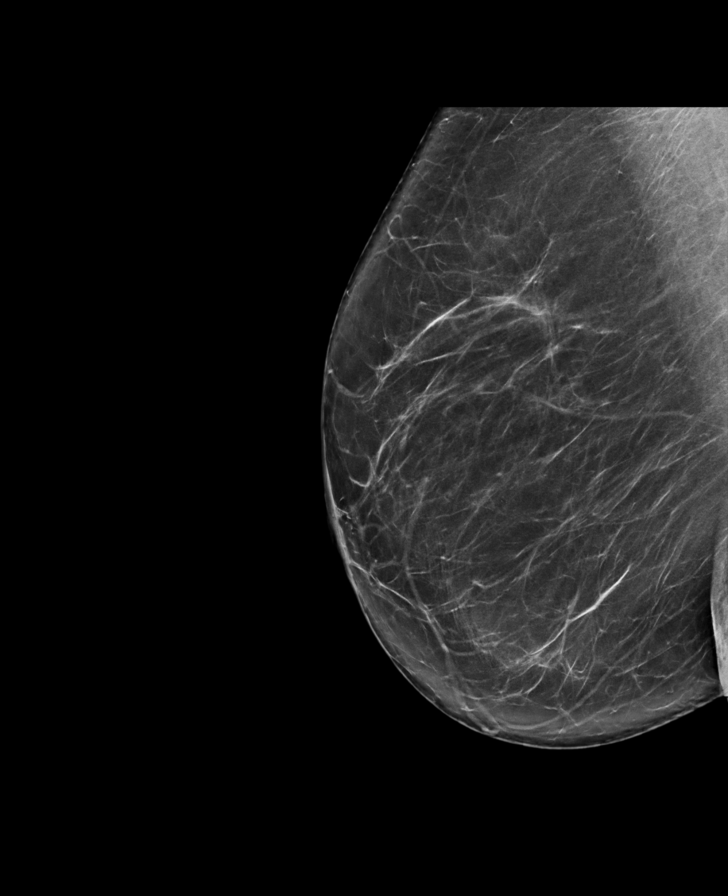

[L CC synth-2D]
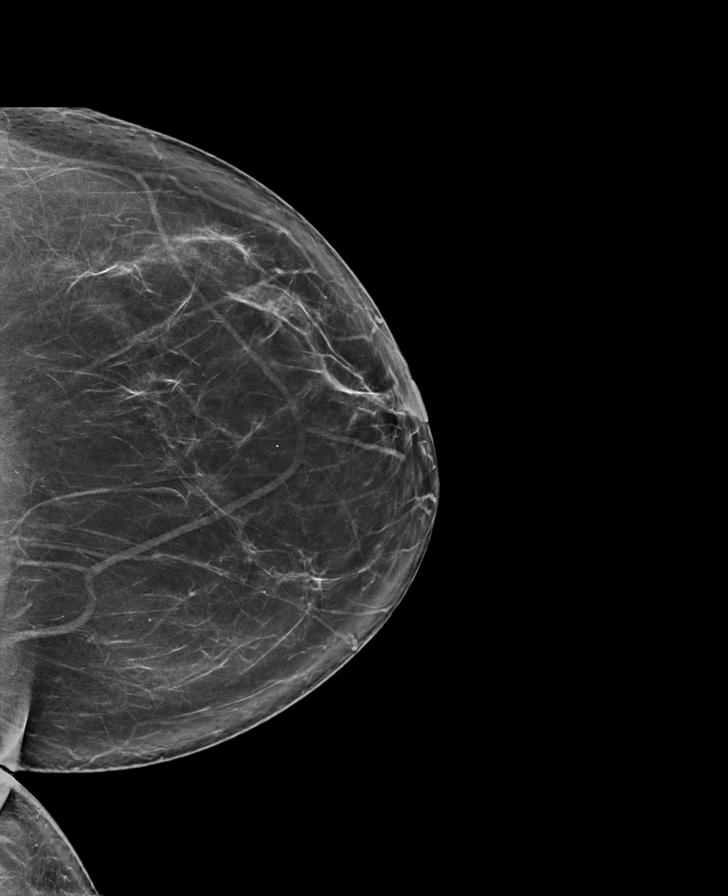

[L MLO tomo · tomo slice 45/90.0]
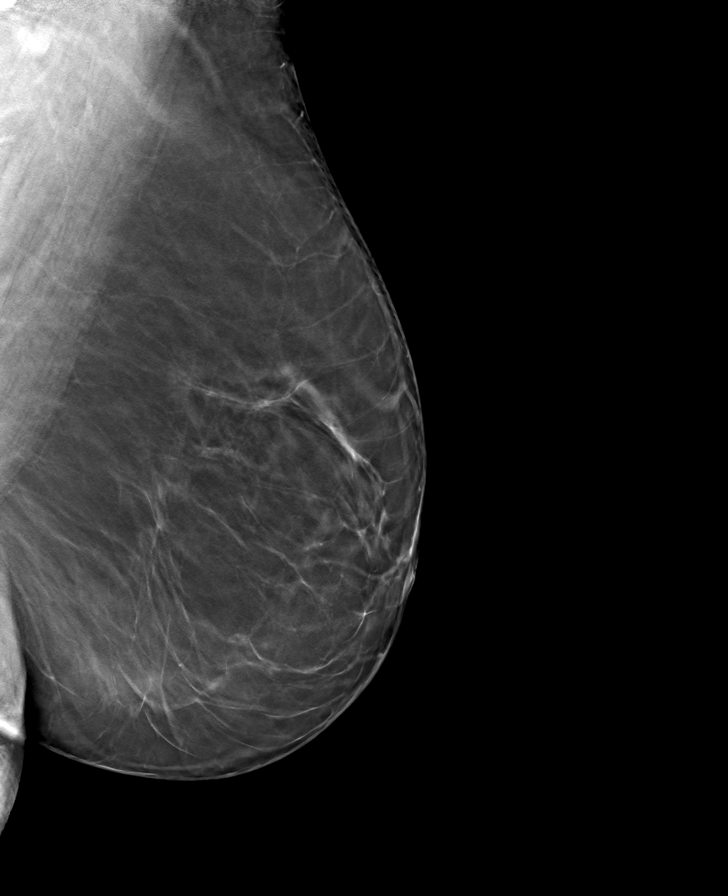

[R MLO tomo · tomo slice 44/87.0]
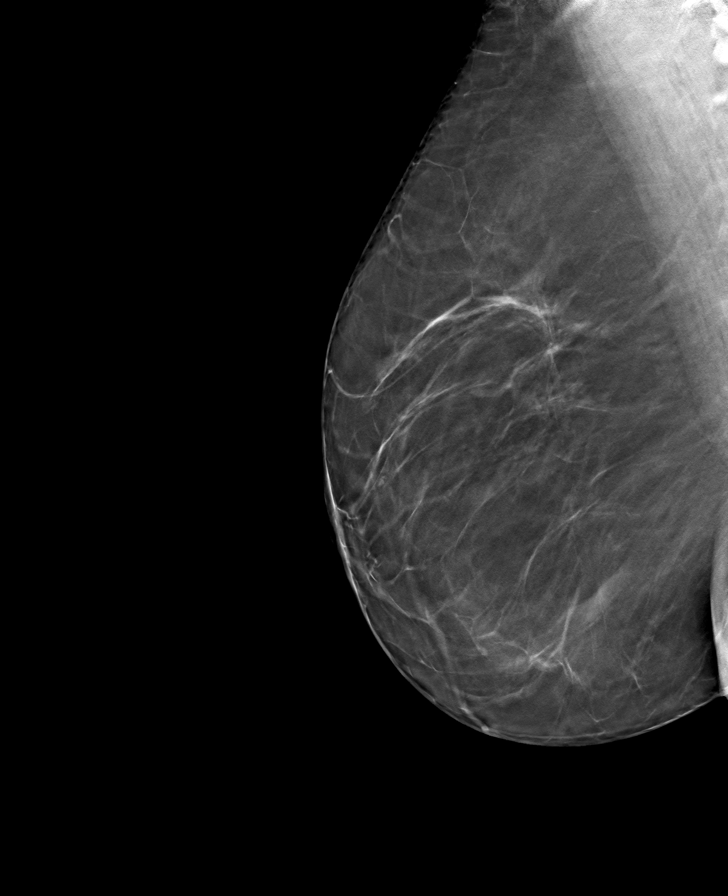

[R CC tomo · tomo slice 41/80.0]
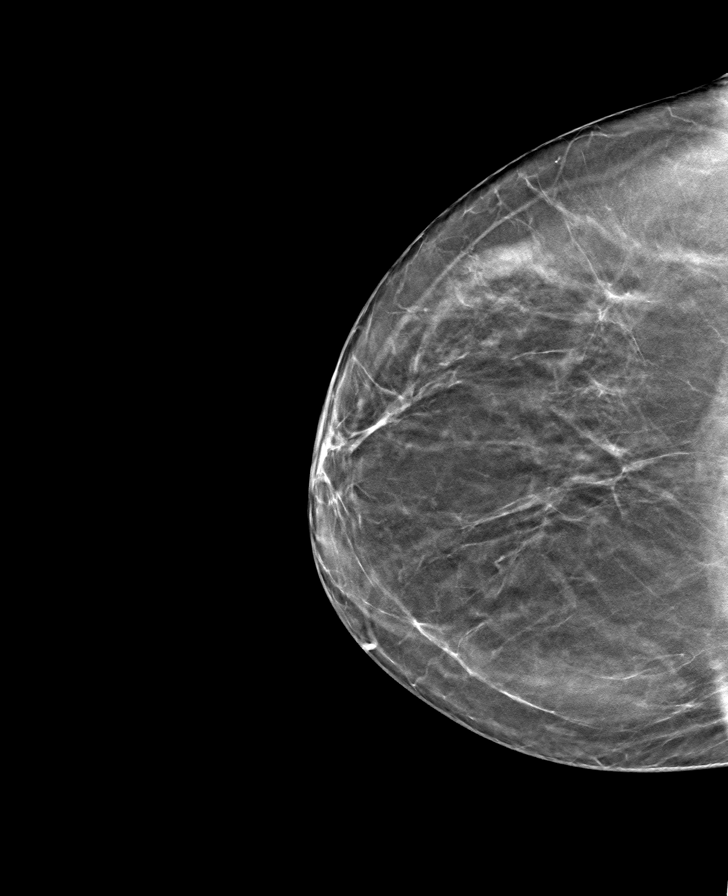

[L CC tomo · tomo slice 44/87.0]
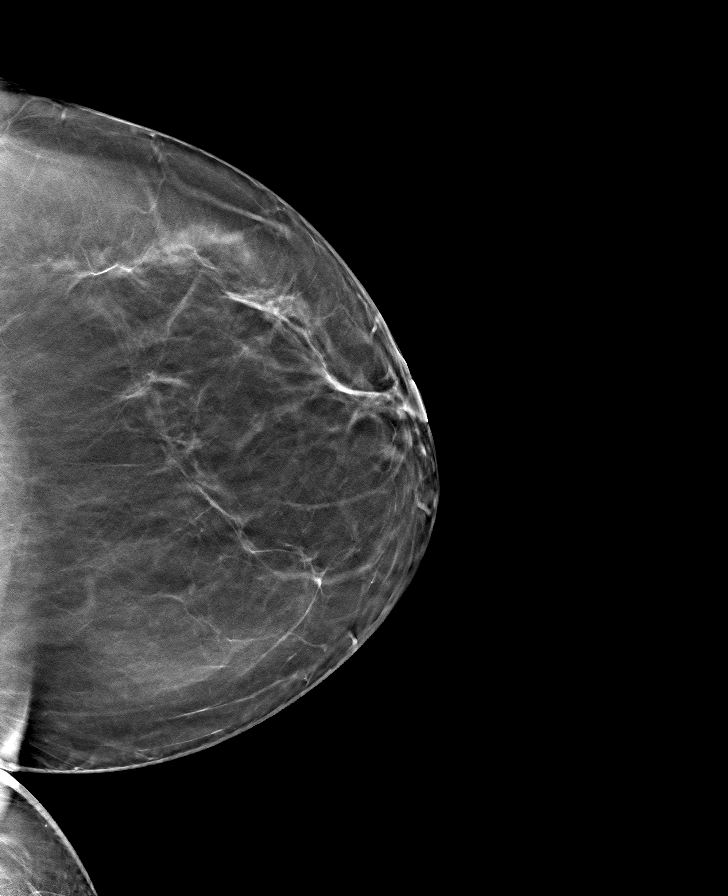

[8 of 24 positions shown; findings below may reference images not displayed]

ACR Breast Density Category b: There are scattered areas of
fibroglandular density.
FINDINGS: No suspicious masses or calcifications are seen in either breast.
There is no mammographic evidence of malignancy in either breast.

Mammographic images were processed with CAD.
IMPRESSION: No mammographic evidence of malignancy in either breast.

RECOMMENDATION:
1. Recommend further management of the nonfocal right breast pain be
based on clinical assessment.

2.  Screening mammogram in one year.(Code:EX-4-AMT)

I have discussed the findings and recommendations with the patient.
If applicable, a reminder letter will be sent to the patient
regarding the next appointment.

BI-RADS CATEGORY  1: Negative.

## 2021-08-14 ENCOUNTER — Other Ambulatory Visit (HOSPITAL_COMMUNITY): Payer: Self-pay

## 2021-08-21 ENCOUNTER — Ambulatory Visit: Payer: 59 | Admitting: Physician Assistant

## 2021-08-22 ENCOUNTER — Other Ambulatory Visit: Payer: Self-pay | Admitting: Physician Assistant

## 2021-08-22 ENCOUNTER — Other Ambulatory Visit (HOSPITAL_COMMUNITY): Payer: Self-pay

## 2021-08-23 ENCOUNTER — Other Ambulatory Visit (HOSPITAL_COMMUNITY): Payer: Self-pay

## 2021-08-24 ENCOUNTER — Other Ambulatory Visit (HOSPITAL_COMMUNITY): Payer: Self-pay

## 2021-08-24 NOTE — Progress Notes (Signed)
Office Visit Note  Patient: Kara Campbell             Date of Birth: 1960/06/08           MRN: [id number]             PCP: Fanny Bien, MD Referring: Fanny Bien, MD Visit Date: 08/31/2021 Occupation: '@GUAROCC'$ @  Subjective:  Medication monitoring   History of Present Illness: Kara Campbell is a 61 y.o. female with history of seropositive rheumatoid arthritis and osteoarthritis.  She is on Orencia 125 mg subcutaneous weekly started December 18, 2019 and Arava 20 mg p.o. daily.  She remains on combination therapy without any side effects or injection site reactions.  She has not missed any doses recently.  She has not had any recent or recurrent infections.  She denies any signs or symptoms of a rheumatoid arthritis flare.  She experiences occasional discomfort in her feet especially in the evenings which she attributes to underlying osteoarthritis and hammertoes.  She has not established care at [facility] yet but plans to.    Activities of Daily Living:  Patient reports morning stiffness for 0  none .   Patient Denies nocturnal pain.  Difficulty dressing/grooming: Denies Difficulty climbing stairs: Denies Difficulty getting out of chair: Denies Difficulty using hands for taps, buttons, cutlery, and/or writing: Denies  Review of Systems  Constitutional:  Negative for fatigue.  HENT:  Negative for mouth sores, mouth dryness and nose dryness.   Eyes:  Negative for pain, visual disturbance and dryness.  Respiratory:  Negative for cough, hemoptysis, shortness of breath and difficulty breathing.   Cardiovascular:  Negative for chest pain, palpitations, hypertension and swelling in legs/feet.  Gastrointestinal:  Negative for blood in stool, constipation and diarrhea.  Endocrine: Negative for excessive thirst and increased urination.  Genitourinary:  Negative for difficulty urinating and painful urination.  Musculoskeletal:  Negative for joint pain, joint pain,  joint swelling, myalgias, muscle weakness, morning stiffness, muscle tenderness and myalgias.  Skin:  Negative for color change, pallor, rash, hair loss, nodules/bumps, skin tightness, ulcers and sensitivity to sunlight.  Allergic/Immunologic: Negative for susceptible to infections.  Neurological:  Negative for dizziness, numbness, headaches and weakness.  Hematological:  Negative for bruising/bleeding tendency and swollen glands.  Psychiatric/Behavioral:  Negative for depressed mood and sleep disturbance. The patient is not nervous/anxious.     PMFS History:  Patient Active Problem List   Diagnosis Date Noted   History of hypertension 10/16/2016   Essential hypertension 09/11/2016   Rheumatoid arthritis involving multiple sites with positive rheumatoid factor ([place]) 01/23/2016   High risk medication use 01/23/2016   Primary osteoarthritis of both hands 01/23/2016   Primary osteoarthritis of both feet 01/23/2016   Malignant melanoma ([place]) 01/23/2016   Basal cell carcinoma 01/23/2016   Anxiety 01/23/2016   Chest congestion 06/07/2015    Past Medical History:  Diagnosis Date   Arthritis    COVID-19    Melanoma ([place]) rhe   Rheumatoid arthritis(714.0)     Family History  Problem Relation Age of Onset   Cancer Mother        breast   Breast cancer Mother 67   Rheum arthritis Other    Past Surgical History:  Procedure Laterality Date   CESAREAN SECTION     INJECTION KNEE Right    Fluid removed from knee x2    MELANOMA EXCISION     SKIN BIOPSY     back of right  leg, right buttocks, right chest, left cheek    Social History   Social History Narrative   Not on file   Immunization History  Administered Date(s) Administered   PFIZER(Purple Top)SARS-COV-2 Vaccination 03/01/2019, 03/22/2019, 11/24/2019     Objective: Vital Signs: BP 119/77 (BP Location: Left Arm, Patient Position: Sitting, Cuff Size: Normal)   Pulse 75   Resp 16   Ht '5\' 5"'$  (1.651 m)   Wt 163 lb (73.9 kg)    BMI 27.12 kg/m    Physical Exam Vitals and nursing note reviewed.  Constitutional:      Appearance: She is well-developed.  HENT:     Head: Normocephalic and atraumatic.  Eyes:     Conjunctiva/sclera: Conjunctivae normal.  Cardiovascular:     Rate and Rhythm: Normal rate and regular rhythm.     Heart sounds: Normal heart sounds.  Pulmonary:     Effort: Pulmonary effort is normal.     Breath sounds: Normal breath sounds.  Abdominal:     General: Bowel sounds are normal.     Palpations: Abdomen is soft.  Musculoskeletal:     Cervical back: Normal range of motion.  Skin:    General: Skin is warm and dry.     Capillary Refill: Capillary refill takes less than 2 seconds.  Neurological:     Mental Status: She is alert and oriented to person, place, and time.  Psychiatric:        Behavior: Behavior normal.      Musculoskeletal Exam: C-spine, thoracic spine, and lumbar spine have good ROM with no discomfort.  No midline spinal tenderness or SI joint tenderness.  Shoulder joints, elbow joints, wrist joints, MCPs, PIPs, DIPs have good range of motion with no synovitis.  Synovial thickening of MCP joints but no tenderness or synovitis noted.  PIP and DIP prominence consistent with osteoarthritis of both hands.  Hip joints have good range of motion with no groin pain.  Knee joints have good range of motion with no warmth or effusion.  Ankle joints have good range of motion with no tenderness or joint swelling.  Pes cavus noted bilaterally.  Hammertoes also noted.  CDAI Exam: CDAI Score: 0.2  Patient Global: 1 mm; Provider Global: 1 mm Swollen: 0 ; Tender: 0  Joint Exam 08/31/2021   No joint exam has been documented for this visit   There is currently no information documented on the homunculus. Go to the Rheumatology activity and complete the homunculus joint exam.  Investigation: No additional findings.  Imaging: No results found.  Recent Labs: Lab Results  Component Value  Date   WBC 4.9 03/22/2021   HGB 13.7 03/22/2021   PLT 313 03/22/2021   NA 140 03/22/2021   K 4.1 03/22/2021   CL 101 03/22/2021   CO2 33 (H) 03/22/2021   GLUCOSE 93 03/22/2021   BUN 11 03/22/2021   CREATININE 0.70 03/22/2021   BILITOT 0.7 03/22/2021   ALKPHOS 59 10/23/2016   AST 28 03/22/2021   ALT 39 (H) 03/22/2021   PROT 6.9 03/22/2021   ALBUMIN 4.2 10/23/2016   CALCIUM 9.9 03/22/2021   GFRAA 112 07/28/2020   QFTBGOLDPLUS NEGATIVE 01/05/2021    Speciality Comments: PLQ Eye exam: 04/13/19 WNL @ [facility] Follow up in 1 year Orencia started October 2021.  Plaquenil discontinued due to improvement  Procedures:  No procedures performed Allergies: Amoxil [amoxicillin]   Assessment / Plan:     Visit Diagnoses: Rheumatoid arthritis involving multiple sites with  positive rheumatoid factor (HCC) Erosive disease - Erosive disease: She has no synovitis on examination today.  She has not had any signs or symptoms of a rheumatoid arthritis flare.  She has clinically been doing well on Orencia 125 mg subcu days injections once weekly and Arava 20 mg daily.  She is tolerating combination therapy without any side effects and has not missed any doses recently.  She has not had any recent or recurrent infections.  She has synovial thickening over the right second MCP joint but no active synovitis was noted.  She experiences occasional discomfort in her feet especially in the evenings due to underlying osteoarthritic changes.  She plans on establishing care at [facility] to be fitted for orthotics.  She will remain on Orencia and Arava as combination therapy.  She was advised to notify us if she develops increased joint pain or joint swelling.  She will follow-up in the office in 5 months or sooner if needed.  High risk medication use - Orencia 125 mg subcutaneous weekly started December 18, 2019, Arava 20 mg by mouth daily.  (Plaquenil discontinued March 2022-improvement). - Plan: CBC  with Differential/Platelet, COMPLETE METABOLIC PANEL WITH GFR CBC and CMP updated on 03/22/21.  She is due to update lab work today.  Orders released.  Her next lab work will be due in September and every 3 months.  Standing orders for CBC and CMP remain in place. TB gold negative on 01/05/21.   She has not had any recent or recurrent infections. Discussed the importance of holding orencia and arava if she develops signs or symptoms of an infection and to resume once the infection has completely cleared.  No new medical conditions or recent hospitalizations.  Primary osteoarthritis of both hands: She has PIP and DIP thickening consistent with osteoarthritis of both hands.  No tenderness or inflammation noted.  Complete fist formation bilaterally.  Discussed the importance of joint protection and muscle strengthening.  Lateral epicondylitis, right elbow:  Resolved.   Primary osteoarthritis of both feet - X-ray showed rheumatoid arthritis and osteoarthritis overlap with erosive changes.  Hammertoes noted.  PIP and DIP thickening consistent with osteoarthritis of both feet.  Patient plans on establishing care at [facility] to be fitted for orthotics.   Pes cavus of both feet: Discussed the importance of wearing proper fitting shoes.  Osteopenia of multiple sites - DEXA updated on 02/25/20: LFN BMD 0.707 with T-score -1.3.  She is taking vitamin D 2000 units daily. DEXA due in December 2023.    Other medical conditions are listed as follows:   History of melanoma  History of hypertension: BP was 119/77 today in the office.   History of basal cell cancer  History of squamous cell carcinoma  Anxiety  Orders: Orders Placed This Encounter  Procedures   CBC with Differential/Platelet   COMPLETE METABOLIC PANEL WITH GFR   No orders of the defined types were placed in this encounter.  Follow-Up Instructions: Return in about 5 months (around 01/31/2022) for Rheumatoid arthritis,  Osteoarthritis.   Ofilia Neas, PA-C  Note - This record has been created using Dragon software.  Chart creation errors have been sought, but may not always  have been located. Such creation errors do not reflect on  the standard of medical care.

## 2021-08-25 ENCOUNTER — Other Ambulatory Visit (HOSPITAL_COMMUNITY): Payer: Self-pay

## 2021-08-30 ENCOUNTER — Other Ambulatory Visit (HOSPITAL_COMMUNITY): Payer: Self-pay

## 2021-08-31 ENCOUNTER — Other Ambulatory Visit: Payer: Self-pay | Admitting: Rheumatology

## 2021-08-31 ENCOUNTER — Other Ambulatory Visit (HOSPITAL_COMMUNITY): Payer: Self-pay

## 2021-08-31 ENCOUNTER — Ambulatory Visit: Payer: 59 | Admitting: Physician Assistant

## 2021-08-31 ENCOUNTER — Encounter: Payer: Self-pay | Admitting: Physician Assistant

## 2021-08-31 VITALS — BP 119/77 | HR 75 | Resp 16 | Ht 65.0 in | Wt 163.0 lb

## 2021-08-31 DIAGNOSIS — Q6672 Congenital pes cavus, left foot: Secondary | ICD-10-CM

## 2021-08-31 DIAGNOSIS — Z8679 Personal history of other diseases of the circulatory system: Secondary | ICD-10-CM | POA: Diagnosis not present

## 2021-08-31 DIAGNOSIS — M8589 Other specified disorders of bone density and structure, multiple sites: Secondary | ICD-10-CM | POA: Diagnosis not present

## 2021-08-31 DIAGNOSIS — M19042 Primary osteoarthritis, left hand: Secondary | ICD-10-CM

## 2021-08-31 DIAGNOSIS — Z79899 Other long term (current) drug therapy: Secondary | ICD-10-CM

## 2021-08-31 DIAGNOSIS — M19041 Primary osteoarthritis, right hand: Secondary | ICD-10-CM | POA: Diagnosis not present

## 2021-08-31 DIAGNOSIS — M7711 Lateral epicondylitis, right elbow: Secondary | ICD-10-CM | POA: Diagnosis not present

## 2021-08-31 DIAGNOSIS — Z85820 Personal history of malignant melanoma of skin: Secondary | ICD-10-CM | POA: Diagnosis not present

## 2021-08-31 DIAGNOSIS — M0579 Rheumatoid arthritis with rheumatoid factor of multiple sites without organ or systems involvement: Secondary | ICD-10-CM | POA: Diagnosis not present

## 2021-08-31 DIAGNOSIS — Q6671 Congenital pes cavus, right foot: Secondary | ICD-10-CM

## 2021-08-31 DIAGNOSIS — Z85828 Personal history of other malignant neoplasm of skin: Secondary | ICD-10-CM

## 2021-08-31 DIAGNOSIS — M19071 Primary osteoarthritis, right ankle and foot: Secondary | ICD-10-CM

## 2021-08-31 DIAGNOSIS — Z8589 Personal history of malignant neoplasm of other organs and systems: Secondary | ICD-10-CM

## 2021-08-31 DIAGNOSIS — M19072 Primary osteoarthritis, left ankle and foot: Secondary | ICD-10-CM

## 2021-08-31 DIAGNOSIS — F419 Anxiety disorder, unspecified: Secondary | ICD-10-CM

## 2021-08-31 MED ORDER — LEFLUNOMIDE 20 MG PO TABS
ORAL_TABLET | Freq: Every day | ORAL | 0 refills | Status: DC
  Filled 2021-08-31: qty 30, 30d supply, fill #0
  Filled 2021-09-02: qty 60, 60d supply, fill #0

## 2021-08-31 MED ORDER — ORENCIA CLICKJECT 125 MG/ML ~~LOC~~ SOAJ
125.0000 mg | SUBCUTANEOUS | 0 refills | Status: DC
  Filled 2021-08-31: qty 4, 28d supply, fill #0

## 2021-08-31 NOTE — Telephone Encounter (Signed)
Next Visit: 02/21/2022  Last Visit: 08/31/2021  Last Fill: 05/23/2021  BW:IOMBTDHRCB arthritis involving multiple sites with positive rheumatoid factor (HCC) Erosive disease   Current Dose per office note 08/31/2021: Orencia 125 mg subcutaneous weekly. Arava 20 mg by mouth daily  Labs: 03/22/2021 CBC is normal.  ALT is mildly elevated.  (Patient updated labs today.)  TB Gold: 01/05/2021 Neg    Okay to refill Orencia and [place]?

## 2021-08-31 NOTE — Patient Instructions (Signed)
Standing Labs We placed an order today for your standing lab work.   Please have your standing labs drawn in September and every 3 months   If possible, please have your labs drawn 2 weeks prior to your appointment so that the provider can discuss your results at your appointment.  Please note that you may see your imaging and lab results in MyChart before we have reviewed them. We may be awaiting multiple results to interpret others before contacting you. Please allow our office up to 72 hours to thoroughly review all of the results before contacting the office for clarification of your results.  We have open lab daily: Monday through Thursday from 1:30-4:30 PM and Friday from 1:30-4:00 PM at the office of Dr. Shaili Deveshwar, [facility].   Please be advised, all patients with office appointments requiring lab work will take precedent over walk-in lab work.  If possible, please come for your lab work on Monday and Friday afternoons, as you may experience shorter wait times. The office is located at [address], [address], [place], [place] [address] No appointment is necessary.   Labs are drawn by [facility]. Please bring your co-pay at the time of your lab draw.  You may receive a bill from [facility] for your lab work.  Please note if you are on Hydroxychloroquine and and an order has been placed for a Hydroxychloroquine level, you will need to have it drawn 4 hours or more after your last dose.  If you wish to have your labs drawn at another location, please call the office 24 hours in advance to send orders.  If you have any questions regarding directions or hours of operation,  please call [phone].   As a reminder, please drink plenty of water prior to coming for your lab work. Thanks!  

## 2021-08-31 NOTE — Telephone Encounter (Signed)
Patient called the office requesting a refill of Leflunomide '20mg'$  and Orencia '125mg'$ /ml be sent to [facility].

## 2021-09-01 ENCOUNTER — Other Ambulatory Visit (HOSPITAL_COMMUNITY): Payer: Self-pay

## 2021-09-01 LAB — CBC WITH DIFFERENTIAL/PLATELET
Absolute Monocytes: 583 cells/uL (ref 200–950)
Basophils Absolute: 49 cells/uL (ref 0–200)
Basophils Relative: 0.9 %
Eosinophils Absolute: 108 cells/uL (ref 15–500)
Eosinophils Relative: 2 %
HCT: 40.7 % (ref 35.0–45.0)
Hemoglobin: 13.5 g/dL (ref 11.7–15.5)
Lymphs Abs: 1415 cells/uL (ref 850–3900)
MCH: 30.5 pg (ref 27.0–33.0)
MCHC: 33.2 g/dL (ref 32.0–36.0)
MCV: 91.9 fL (ref 80.0–100.0)
MPV: 9.9 fL (ref 7.5–12.5)
Monocytes Relative: 10.8 %
Neutro Abs: 3245 cells/uL (ref 1500–7800)
Neutrophils Relative %: 60.1 %
Platelets: 305 10*3/uL (ref 140–400)
RBC: 4.43 10*6/uL (ref 3.80–5.10)
RDW: 12.3 % (ref 11.0–15.0)
Total Lymphocyte: 26.2 %
WBC: 5.4 10*3/uL (ref 3.8–10.8)

## 2021-09-01 LAB — COMPLETE METABOLIC PANEL WITH GFR
AG Ratio: 2 (calc) (ref 1.0–2.5)
ALT: 31 U/L — ABNORMAL HIGH (ref 6–29)
AST: 25 U/L (ref 10–35)
Albumin: 4.5 g/dL (ref 3.6–5.1)
Alkaline phosphatase (APISO): 62 U/L (ref 37–153)
BUN: 14 mg/dL (ref 7–25)
CO2: 26 mmol/L (ref 20–32)
Calcium: 9.5 mg/dL (ref 8.6–10.4)
Chloride: 104 mmol/L (ref 98–110)
Creat: 0.79 mg/dL (ref 0.50–1.05)
Globulin: 2.3 g/dL (calc) (ref 1.9–3.7)
Glucose, Bld: 88 mg/dL (ref 65–99)
Potassium: 4.2 mmol/L (ref 3.5–5.3)
Sodium: 140 mmol/L (ref 135–146)
Total Bilirubin: 0.5 mg/dL (ref 0.2–1.2)
Total Protein: 6.8 g/dL (ref 6.1–8.1)
eGFR: 86 mL/min/{1.73_m2} (ref >=60)

## 2021-09-02 ENCOUNTER — Other Ambulatory Visit (HOSPITAL_COMMUNITY): Payer: Self-pay

## 2021-09-04 ENCOUNTER — Other Ambulatory Visit (HOSPITAL_COMMUNITY): Payer: Self-pay

## 2021-09-04 ENCOUNTER — Other Ambulatory Visit: Payer: Self-pay | Admitting: Pharmacist

## 2021-09-04 MED ORDER — ORENCIA CLICKJECT 125 MG/ML ~~LOC~~ SOAJ
125.0000 mg | SUBCUTANEOUS | 0 refills | Status: DC
  Filled 2021-09-04: qty 12, 84d supply, fill #0
  Filled 2021-09-20: qty 4, 28d supply, fill #0

## 2021-09-06 ENCOUNTER — Other Ambulatory Visit (HOSPITAL_COMMUNITY): Payer: Self-pay

## 2021-09-20 ENCOUNTER — Other Ambulatory Visit (HOSPITAL_COMMUNITY): Payer: Self-pay

## 2021-09-28 ENCOUNTER — Other Ambulatory Visit (HOSPITAL_COMMUNITY): Payer: Self-pay

## 2021-10-07 ENCOUNTER — Other Ambulatory Visit (HOSPITAL_COMMUNITY): Payer: Self-pay

## 2021-10-11 ENCOUNTER — Other Ambulatory Visit (HOSPITAL_COMMUNITY): Payer: Self-pay

## 2021-10-11 ENCOUNTER — Other Ambulatory Visit: Payer: Self-pay | Admitting: Pharmacist

## 2021-10-11 DIAGNOSIS — M0579 Rheumatoid arthritis with rheumatoid factor of multiple sites without organ or systems involvement: Secondary | ICD-10-CM

## 2021-10-11 MED ORDER — ORENCIA CLICKJECT 125 MG/ML ~~LOC~~ SOAJ
125.0000 mg | SUBCUTANEOUS | 0 refills | Status: DC
  Filled 2021-10-11: qty 8, 56d supply, fill #0
  Filled 2021-10-24: qty 4, 28d supply, fill #0
  Filled 2021-11-24: qty 4, 28d supply, fill #1

## 2021-10-11 NOTE — Telephone Encounter (Signed)
Rx for Orencia '125mg'$ SQ every 7 days sent to [facility] for hospital-based cost pricing.   Total qty remaining is 8 ml  Knox Saliva, PharmD, MPH, BCPS, CPP Clinical Pharmacist (Rheumatology and Pulmonology)

## 2021-10-24 ENCOUNTER — Other Ambulatory Visit (HOSPITAL_COMMUNITY): Payer: Self-pay

## 2021-10-25 ENCOUNTER — Other Ambulatory Visit (HOSPITAL_COMMUNITY): Payer: Self-pay

## 2021-10-31 ENCOUNTER — Other Ambulatory Visit (HOSPITAL_COMMUNITY): Payer: Self-pay

## 2021-11-20 ENCOUNTER — Other Ambulatory Visit (HOSPITAL_COMMUNITY): Payer: Self-pay

## 2021-11-21 ENCOUNTER — Other Ambulatory Visit (HOSPITAL_COMMUNITY): Payer: Self-pay

## 2021-11-23 ENCOUNTER — Other Ambulatory Visit (HOSPITAL_COMMUNITY): Payer: Self-pay

## 2021-11-24 ENCOUNTER — Other Ambulatory Visit (HOSPITAL_COMMUNITY): Payer: Self-pay

## 2021-11-27 ENCOUNTER — Other Ambulatory Visit (HOSPITAL_COMMUNITY): Payer: Self-pay

## 2021-11-28 ENCOUNTER — Other Ambulatory Visit: Payer: Self-pay | Admitting: Physician Assistant

## 2021-11-28 ENCOUNTER — Other Ambulatory Visit (HOSPITAL_COMMUNITY): Payer: Self-pay

## 2021-11-28 MED ORDER — LEFLUNOMIDE 20 MG PO TABS
ORAL_TABLET | Freq: Every day | ORAL | 0 refills | Status: DC
Start: 2021-11-28 — End: 2022-02-28
  Filled 2021-11-28: qty 90, 90d supply, fill #0

## 2021-11-28 NOTE — Telephone Encounter (Signed)
Next Visit: 02/21/2022   Last Visit: 08/31/2021   Last Fill: 08/31/2021  TX:MIWOEHOZYY arthritis involving multiple sites with positive rheumatoid factor ([place]) Erosive disease    Current Dose per office note 08/31/2021:  Arava 20 mg by mouth daily   Labs: 08/31/2021 CBC is normal.  Liver function is mildly elevated but improved.  Okay to refill Arava?

## 2021-11-29 DIAGNOSIS — Z0000 Encounter for general adult medical examination without abnormal findings: Secondary | ICD-10-CM | POA: Diagnosis not present

## 2021-11-29 DIAGNOSIS — Z114 Encounter for screening for human immunodeficiency virus [HIV]: Secondary | ICD-10-CM | POA: Diagnosis not present

## 2021-12-04 ENCOUNTER — Other Ambulatory Visit (HOSPITAL_COMMUNITY): Payer: Self-pay

## 2021-12-14 ENCOUNTER — Other Ambulatory Visit (HOSPITAL_COMMUNITY): Payer: Self-pay

## 2021-12-14 DIAGNOSIS — I10 Essential (primary) hypertension: Secondary | ICD-10-CM | POA: Diagnosis not present

## 2021-12-14 DIAGNOSIS — E559 Vitamin D deficiency, unspecified: Secondary | ICD-10-CM | POA: Diagnosis not present

## 2021-12-14 DIAGNOSIS — Z7185 Encounter for immunization safety counseling: Secondary | ICD-10-CM | POA: Diagnosis not present

## 2021-12-14 MED ORDER — VALSARTAN-HYDROCHLOROTHIAZIDE 160-25 MG PO TABS
1.0000 | ORAL_TABLET | Freq: Every day | ORAL | 3 refills | Status: DC
  Filled 2021-12-14: qty 90, 90d supply, fill #0

## 2021-12-19 ENCOUNTER — Other Ambulatory Visit (HOSPITAL_COMMUNITY): Payer: Self-pay

## 2021-12-21 ENCOUNTER — Other Ambulatory Visit: Payer: Self-pay | Admitting: Rheumatology

## 2021-12-21 ENCOUNTER — Other Ambulatory Visit (HOSPITAL_COMMUNITY): Payer: Self-pay

## 2021-12-21 DIAGNOSIS — M0579 Rheumatoid arthritis with rheumatoid factor of multiple sites without organ or systems involvement: Secondary | ICD-10-CM

## 2021-12-27 ENCOUNTER — Other Ambulatory Visit: Payer: Self-pay | Admitting: *Deleted

## 2021-12-27 DIAGNOSIS — Z79899 Other long term (current) drug therapy: Secondary | ICD-10-CM

## 2021-12-27 DIAGNOSIS — Z111 Encounter for screening for respiratory tuberculosis: Secondary | ICD-10-CM | POA: Diagnosis not present

## 2021-12-27 DIAGNOSIS — Z9225 Personal history of immunosupression therapy: Secondary | ICD-10-CM | POA: Diagnosis not present

## 2021-12-28 NOTE — Progress Notes (Signed)
CBC WNL.  ALT is borderline elevated but stable.  Rest of CMP WNL.  We will continue to monitor.

## 2021-12-30 ENCOUNTER — Other Ambulatory Visit: Payer: Self-pay | Admitting: Rheumatology

## 2021-12-30 ENCOUNTER — Other Ambulatory Visit (HOSPITAL_COMMUNITY): Payer: Self-pay

## 2021-12-30 DIAGNOSIS — M0579 Rheumatoid arthritis with rheumatoid factor of multiple sites without organ or systems involvement: Secondary | ICD-10-CM

## 2022-01-01 ENCOUNTER — Other Ambulatory Visit (HOSPITAL_COMMUNITY): Payer: Self-pay

## 2022-01-01 MED ORDER — ORENCIA CLICKJECT 125 MG/ML ~~LOC~~ SOAJ
125.0000 mg | SUBCUTANEOUS | 0 refills | Status: DC
  Filled 2022-01-01: qty 4, 28d supply, fill #0
  Filled 2022-01-10: qty 4, 28d supply, fill #1
  Filled 2022-02-07 – 2022-02-19 (×2): qty 4, 28d supply, fill #2

## 2022-01-01 NOTE — Telephone Encounter (Signed)
Next Visit: 02/19/2022  Last Visit: 08/31/2021  Last Fill: 10/11/2021  KV:TXLEZVGJFT arthritis involving multiple sites with positive rheumatoid factor   Current Dose per office note 08/31/2021: Orencia 125 mg subcutaneous weekly  Labs: 12/27/2021 CBC WNL.  ALT is borderline elevated but stable.  Rest of CMP WNL.    TB Gold: 01/05/2021 neg (updated 12/27/2021, results pending)    Okay to refill Orencia?

## 2022-01-01 NOTE — Telephone Encounter (Signed)
Ok to provide 3 month supply

## 2022-01-02 LAB — COMPLETE METABOLIC PANEL WITH GFR
AG Ratio: 2.1 (calc) (ref 1.0–2.5)
ALT: 34 U/L — ABNORMAL HIGH (ref 6–29)
AST: 30 U/L (ref 10–35)
Albumin: 4.5 g/dL (ref 3.6–5.1)
Alkaline phosphatase (APISO): 59 U/L (ref 37–153)
BUN: 14 mg/dL (ref 7–25)
CO2: 30 mmol/L (ref 20–32)
Calcium: 9.7 mg/dL (ref 8.6–10.4)
Chloride: 99 mmol/L (ref 98–110)
Creat: 0.79 mg/dL (ref 0.50–1.05)
Globulin: 2.1 g/dL (calc) (ref 1.9–3.7)
Glucose, Bld: 89 mg/dL (ref 65–99)
Potassium: 3.9 mmol/L (ref 3.5–5.3)
Sodium: 137 mmol/L (ref 135–146)
Total Bilirubin: 0.7 mg/dL (ref 0.2–1.2)
Total Protein: 6.6 g/dL (ref 6.1–8.1)
eGFR: 85 mL/min/{1.73_m2} (ref >=60)

## 2022-01-02 LAB — CBC WITH DIFFERENTIAL/PLATELET
Absolute Monocytes: 726 cells/uL (ref 200–950)
Basophils Absolute: 53 cells/uL (ref 0–200)
Basophils Relative: 0.8 %
Eosinophils Absolute: 112 cells/uL (ref 15–500)
Eosinophils Relative: 1.7 %
HCT: 36.8 % (ref 35.0–45.0)
Hemoglobin: 12.7 g/dL (ref 11.7–15.5)
Lymphs Abs: 1696 cells/uL (ref 850–3900)
MCH: 31.3 pg (ref 27.0–33.0)
MCHC: 34.5 g/dL (ref 32.0–36.0)
MCV: 90.6 fL (ref 80.0–100.0)
MPV: 10 fL (ref 7.5–12.5)
Monocytes Relative: 11 %
Neutro Abs: 4013 cells/uL (ref 1500–7800)
Neutrophils Relative %: 60.8 %
Platelets: 305 10*3/uL (ref 140–400)
RBC: 4.06 10*6/uL (ref 3.80–5.10)
RDW: 12.2 % (ref 11.0–15.0)
Total Lymphocyte: 25.7 %
WBC: 6.6 10*3/uL (ref 3.8–10.8)

## 2022-01-02 LAB — QUANTIFERON-TB GOLD PLUS
Mitogen-NIL: >10 IU/mL
NIL: 0.03 IU/mL
QuantiFERON-TB Gold Plus: NEGATIVE
TB1-NIL: <0 IU/mL
TB2-NIL: <0 IU/mL

## 2022-01-02 NOTE — Progress Notes (Signed)
TB gold negative

## 2022-01-05 ENCOUNTER — Other Ambulatory Visit (HOSPITAL_COMMUNITY): Payer: Self-pay

## 2022-01-10 ENCOUNTER — Other Ambulatory Visit (HOSPITAL_COMMUNITY): Payer: Self-pay

## 2022-01-11 ENCOUNTER — Telehealth: Payer: Self-pay | Admitting: Pharmacist

## 2022-01-11 NOTE — Telephone Encounter (Signed)
Patient picked up Orencia Clickject '125mg'$ /mL pens x 2 pens. Logged out in sample binder  Knox Saliva, PharmD, MPH, BCPS, CPP Clinical Pharmacist (Rheumatology and Pulmonology)

## 2022-01-11 NOTE — Telephone Encounter (Signed)
Received message from [place] that patient accidentally left her Kara Campbell out of the refrigerator since she picked it up from pharmacy on 01/05/22. Insurance does not allow for early refill override for this reason and patient is not due for another refill until 11/13. Based off how many she had on hand at last fill she is probably going to miss injections for this week and next.  Spoke with patient. She administers Orencia on Sundays therefore will need sample for 01/14/22 and 01/21/22. She states she will plan to stop by today to pick up 2 samples.  Labeled samples in fridge but they are not yet logged out in binder  Orencia Clickject '125mg'$ /mL x 2 pens Lot: [id number] Exp: 07/2022  Knox Saliva, PharmD, MPH, BCPS, CPP Clinical Pharmacist (Rheumatology and Pulmonology)

## 2022-01-15 DIAGNOSIS — N644 Mastodynia: Secondary | ICD-10-CM | POA: Diagnosis not present

## 2022-01-18 ENCOUNTER — Other Ambulatory Visit: Payer: Self-pay | Admitting: Obstetrics and Gynecology

## 2022-01-18 DIAGNOSIS — N644 Mastodynia: Secondary | ICD-10-CM

## 2022-01-22 ENCOUNTER — Other Ambulatory Visit (HOSPITAL_COMMUNITY): Payer: Self-pay

## 2022-01-24 ENCOUNTER — Other Ambulatory Visit (HOSPITAL_COMMUNITY): Payer: Self-pay

## 2022-02-05 ENCOUNTER — Ambulatory Visit
Admission: RE | Admit: 2022-02-05 | Discharge: 2022-02-05 | Disposition: A | Discharge status: Home / Self Care | Payer: 59 | Source: Ambulatory Visit | Attending: Obstetrics and Gynecology | Admitting: Obstetrics and Gynecology

## 2022-02-05 DIAGNOSIS — R92323 Mammographic fibroglandular density, bilateral breasts: Secondary | ICD-10-CM | POA: Diagnosis not present

## 2022-02-05 DIAGNOSIS — N644 Mastodynia: Secondary | ICD-10-CM

## 2022-02-06 NOTE — Progress Notes (Deleted)
Office Visit Note  Patient: Kara Campbell             Date of Birth: 1960-05-20           MRN: [id number]             PCP: Fanny Bien, MD Referring: Fanny Bien, MD Visit Date: 02/19/2022 Occupation: '@GUAROCC'$ @  Subjective:    History of Present Illness: Kara Campbell is a 61 y.o. female with history of seropositive rheumatoid arthritis and osteoarthritis.  She is currently taking Orencia 125 mg subcutaneous weekly and Arava 20 mg by mouth daily.    CBC and CMP updated on 12/27/21.  Her next lab work will be due in January and every 3 months.  TB gold negative on 12/27/21.  Discussed the importance of holding orencia if she develops signs or symptoms of an infection and to resume once the infection has completely cleared.   Activities of Daily Living:  Patient reports morning stiffness for *** {minute/hour:19697}.   Patient {ACTIONS;DENIES/REPORTS:21021675::"Denies"} nocturnal pain.  Difficulty dressing/grooming: {ACTIONS;DENIES/REPORTS:21021675::"Denies"} Difficulty climbing stairs: {ACTIONS;DENIES/REPORTS:21021675::"Denies"} Difficulty getting out of chair: {ACTIONS;DENIES/REPORTS:21021675::"Denies"} Difficulty using hands for taps, buttons, cutlery, and/or writing: {ACTIONS;DENIES/REPORTS:21021675::"Denies"}  No Rheumatology ROS completed.   PMFS History:  Patient Active Problem List   Diagnosis Date Noted   History of hypertension 10/16/2016   Essential hypertension 09/11/2016   Rheumatoid arthritis involving multiple sites with positive rheumatoid factor ([place]) 01/23/2016   High risk medication use 01/23/2016   Primary osteoarthritis of both hands 01/23/2016   Primary osteoarthritis of both feet 01/23/2016   Malignant melanoma ([place]) 01/23/2016   Basal cell carcinoma 01/23/2016   Anxiety 01/23/2016   Chest congestion 06/07/2015    Past Medical History:  Diagnosis Date   Arthritis    COVID-19    Melanoma ([place]) rhe   Rheumatoid arthritis(714.0)      Family History  Problem Relation Age of Onset   Cancer Mother        breast   Breast cancer Mother 42   Rheum arthritis Other    Past Surgical History:  Procedure Laterality Date   CESAREAN SECTION     INJECTION KNEE Right    Fluid removed from knee x2    MELANOMA EXCISION     SKIN BIOPSY     back of right leg, right buttocks, right chest, left cheek    Social History   Social History Narrative   Not on file   Immunization History  Administered Date(s) Administered   PFIZER(Purple Top)SARS-COV-2 Vaccination 03/01/2019, 03/22/2019, 11/24/2019     Objective: Vital Signs: There were no vitals taken for this visit.   Physical Exam Vitals and nursing note reviewed.  Constitutional:      Appearance: She is well-developed.  HENT:     Head: Normocephalic and atraumatic.  Eyes:     Conjunctiva/sclera: Conjunctivae normal.  Cardiovascular:     Rate and Rhythm: Normal rate and regular rhythm.     Heart sounds: Normal heart sounds.  Pulmonary:     Effort: Pulmonary effort is normal.     Breath sounds: Normal breath sounds.  Abdominal:     General: Bowel sounds are normal.     Palpations: Abdomen is soft.  Musculoskeletal:     Cervical back: Normal range of motion.  Lymphadenopathy:     Cervical: No cervical adenopathy.  Skin:    General: Skin is warm and dry.     Capillary Refill: Capillary refill takes less than 2 seconds.  Neurological:     Mental Status: She is alert and oriented to person, place, and time.  Psychiatric:        Behavior: Behavior normal.      Musculoskeletal Exam: ***  CDAI Exam: CDAI Score: -- Patient Global: --; Provider Global: -- Swollen: --; Tender: -- Joint Exam 02/19/2022   No joint exam has been documented for this visit   There is currently no information documented on the homunculus. Go to the Rheumatology activity and complete the homunculus joint exam.  Investigation: No additional findings.  Imaging: MM DIAG BREAST  TOMO BILATERAL  Result Date: 02/05/2022 CLINICAL DATA:  61 year old female presenting for evaluation of 1 month of retroareolar right breast pain. No nipple discharge. EXAM: DIGITAL DIAGNOSTIC BILATERAL MAMMOGRAM WITH TOMOSYNTHESIS; ULTRASOUND RIGHT BREAST LIMITED TECHNIQUE: Bilateral digital diagnostic mammography and breast tomosynthesis was performed.; Targeted ultrasound examination of the right breast was performed COMPARISON:  Previous exam(s). ACR Breast Density Category b: There are scattered areas of fibroglandular density. FINDINGS: Mammogram: Right breast: A spot compression tomosynthesis view of the retroareolar right breast was performed in addition to standard views. No suspicious mass, distortion, or microcalcifications are identified to suggest presence of malignancy. Left breast: No suspicious mass, distortion, or microcalcifications are identified to suggest presence of malignancy. On physical exam of the right breast I do not feel a discrete mass or focal area of thickening. Normal appearance of the nipple. Ultrasound: Targeted ultrasound throughout the retroareolar aspect of the right breast demonstrating no cystic or solid mass. No dilated duct or focal area of shadowing. IMPRESSION: 1. No mammographic or sonographic evidence of malignancy or other finding to explain the patient's retroareolar right breast pain. 2.  No mammographic evidence of malignancy in the left breast. RECOMMENDATION: 1. Clinical follow-up as needed for right breast pain. Breast pain is a common condition, which will often resolve on its own without intervention. It can be affected by hormonal changes, medication side effect, weight changes and fit of the bra. Pain may also be referred from other adjacent areas of the body. Breast pain may be improved by wearing adequate well-fitting support, over-the-counter topical and oral NSAID medication, low-fat diet, and ice/heat as needed. Studies have shown an improvement in  cyclic pain with use of evening primrose oil and vitamin E. 2.  Screening mammogram in one year.(Code:SM-B-01Y) I have discussed the findings and recommendations with the patient. If applicable, a reminder letter will be sent to the patient regarding the next appointment. BI-RADS CATEGORY  1: Negative. Electronically Signed   By: Audie Pinto M.D.   On: 02/05/2022 14:27  US BREAST LTD UNI RIGHT INC AXILLA  Result Date: 02/05/2022 CLINICAL DATA:  61 year old female presenting for evaluation of 1 month of retroareolar right breast pain. No nipple discharge. EXAM: DIGITAL DIAGNOSTIC BILATERAL MAMMOGRAM WITH TOMOSYNTHESIS; ULTRASOUND RIGHT BREAST LIMITED TECHNIQUE: Bilateral digital diagnostic mammography and breast tomosynthesis was performed.; Targeted ultrasound examination of the right breast was performed COMPARISON:  Previous exam(s). ACR Breast Density Category b: There are scattered areas of fibroglandular density. FINDINGS: Mammogram: Right breast: A spot compression tomosynthesis view of the retroareolar right breast was performed in addition to standard views. No suspicious mass, distortion, or microcalcifications are identified to suggest presence of malignancy. Left breast: No suspicious mass, distortion, or microcalcifications are identified to suggest presence of malignancy. On physical exam of the right breast I do not feel a discrete mass or focal area of thickening. Normal appearance of the nipple. Ultrasound: Targeted ultrasound throughout the  retroareolar aspect of the right breast demonstrating no cystic or solid mass. No dilated duct or focal area of shadowing. IMPRESSION: 1. No mammographic or sonographic evidence of malignancy or other finding to explain the patient's retroareolar right breast pain. 2.  No mammographic evidence of malignancy in the left breast. RECOMMENDATION: 1. Clinical follow-up as needed for right breast pain. Breast pain is a common condition, which will often  resolve on its own without intervention. It can be affected by hormonal changes, medication side effect, weight changes and fit of the bra. Pain may also be referred from other adjacent areas of the body. Breast pain may be improved by wearing adequate well-fitting support, over-the-counter topical and oral NSAID medication, low-fat diet, and ice/heat as needed. Studies have shown an improvement in cyclic pain with use of evening primrose oil and vitamin E. 2.  Screening mammogram in one year.(Code:SM-B-01Y) I have discussed the findings and recommendations with the patient. If applicable, a reminder letter will be sent to the patient regarding the next appointment. BI-RADS CATEGORY  1: Negative. Electronically Signed   By: Audie Pinto M.D.   On: 02/05/2022 14:27   Recent Labs: Lab Results  Component Value Date   WBC 6.6 12/27/2021   HGB 12.7 12/27/2021   PLT 305 12/27/2021   NA 137 12/27/2021   K 3.9 12/27/2021   CL 99 12/27/2021   CO2 30 12/27/2021   GLUCOSE 89 12/27/2021   BUN 14 12/27/2021   CREATININE 0.79 12/27/2021   BILITOT 0.7 12/27/2021   ALKPHOS 59 10/23/2016   AST 30 12/27/2021   ALT 34 (H) 12/27/2021   PROT 6.6 12/27/2021   ALBUMIN 4.2 10/23/2016   CALCIUM 9.7 12/27/2021   GFRAA 112 07/28/2020   QFTBGOLDPLUS NEGATIVE 12/27/2021    Speciality Comments: PLQ Eye exam: 04/13/19 WNL @ [facility] Follow up in 1 year Orencia started October 2021.  Plaquenil discontinued due to improvement  Procedures:  No procedures performed Allergies: Amoxil [amoxicillin]   Assessment / Plan:     Visit Diagnoses: No diagnosis found.  Orders: No orders of the defined types were placed in this encounter.  No orders of the defined types were placed in this encounter.   Face-to-face time spent with patient was *** minutes. Greater than 50% of time was spent in counseling and coordination of care.  Follow-Up Instructions: No follow-ups on file.   Earnestine Mealing,  CMA  Note - This record has been created using Editor, commissioning.  Chart creation errors have been sought, but may not always  have been located. Such creation errors do not reflect on  the standard of medical care.

## 2022-02-07 ENCOUNTER — Other Ambulatory Visit (HOSPITAL_COMMUNITY): Payer: Self-pay

## 2022-02-13 ENCOUNTER — Other Ambulatory Visit (HOSPITAL_COMMUNITY): Payer: Self-pay

## 2022-02-19 ENCOUNTER — Ambulatory Visit: Payer: 59 | Admitting: Physician Assistant

## 2022-02-19 ENCOUNTER — Other Ambulatory Visit (HOSPITAL_COMMUNITY): Payer: Self-pay

## 2022-02-19 DIAGNOSIS — Z79899 Other long term (current) drug therapy: Secondary | ICD-10-CM

## 2022-02-19 DIAGNOSIS — Z8679 Personal history of other diseases of the circulatory system: Secondary | ICD-10-CM

## 2022-02-19 DIAGNOSIS — Z85828 Personal history of other malignant neoplasm of skin: Secondary | ICD-10-CM

## 2022-02-19 DIAGNOSIS — Q6672 Congenital pes cavus, left foot: Secondary | ICD-10-CM

## 2022-02-19 DIAGNOSIS — Z8589 Personal history of malignant neoplasm of other organs and systems: Secondary | ICD-10-CM

## 2022-02-19 DIAGNOSIS — M19041 Primary osteoarthritis, right hand: Secondary | ICD-10-CM

## 2022-02-19 DIAGNOSIS — Z85820 Personal history of malignant melanoma of skin: Secondary | ICD-10-CM

## 2022-02-19 DIAGNOSIS — M0579 Rheumatoid arthritis with rheumatoid factor of multiple sites without organ or systems involvement: Secondary | ICD-10-CM

## 2022-02-19 DIAGNOSIS — F419 Anxiety disorder, unspecified: Secondary | ICD-10-CM

## 2022-02-19 DIAGNOSIS — M19071 Primary osteoarthritis, right ankle and foot: Secondary | ICD-10-CM

## 2022-02-19 DIAGNOSIS — M8589 Other specified disorders of bone density and structure, multiple sites: Secondary | ICD-10-CM

## 2022-02-19 DIAGNOSIS — M7711 Lateral epicondylitis, right elbow: Secondary | ICD-10-CM

## 2022-02-20 ENCOUNTER — Other Ambulatory Visit: Payer: Self-pay

## 2022-02-20 ENCOUNTER — Other Ambulatory Visit (HOSPITAL_COMMUNITY): Payer: Self-pay

## 2022-02-21 ENCOUNTER — Ambulatory Visit: Payer: 59 | Admitting: Rheumatology

## 2022-02-24 ENCOUNTER — Other Ambulatory Visit (HOSPITAL_COMMUNITY): Payer: Self-pay

## 2022-02-27 ENCOUNTER — Other Ambulatory Visit (HOSPITAL_COMMUNITY): Payer: Self-pay

## 2022-02-28 ENCOUNTER — Other Ambulatory Visit: Payer: Self-pay | Admitting: Physician Assistant

## 2022-02-28 ENCOUNTER — Other Ambulatory Visit (HOSPITAL_COMMUNITY): Payer: Self-pay

## 2022-02-28 MED ORDER — LEFLUNOMIDE 20 MG PO TABS
20.0000 mg | ORAL_TABLET | Freq: Every day | ORAL | 0 refills | Status: DC
  Filled 2022-02-28: qty 30, 30d supply, fill #0

## 2022-02-28 NOTE — Telephone Encounter (Signed)
Next Visit: Due December 2023. Message sent to the front to schedule.    Last Visit: 08/31/2021   Last Fill: 11/28/2021  EM:[id number] arthritis involving multiple sites with positive rheumatoid factor    Current Dose per office note 08/31/2021: Arava 20 mg by mouth daily   Labs: 12/27/2021 CBC WNL.  ALT is borderline elevated but stable.  Rest of CMP WNL  Okay to refill Arava?

## 2022-02-28 NOTE — Telephone Encounter (Signed)
Please schedule patient a follow up visit. Patient due December 2023. Thanks!

## 2022-03-01 ENCOUNTER — Other Ambulatory Visit (HOSPITAL_COMMUNITY): Payer: Self-pay

## 2022-03-08 ENCOUNTER — Other Ambulatory Visit (HOSPITAL_COMMUNITY): Payer: Self-pay

## 2022-03-08 ENCOUNTER — Other Ambulatory Visit: Payer: Self-pay | Admitting: Physician Assistant

## 2022-03-08 DIAGNOSIS — M0579 Rheumatoid arthritis with rheumatoid factor of multiple sites without organ or systems involvement: Secondary | ICD-10-CM

## 2022-03-08 MED ORDER — ORENCIA CLICKJECT 125 MG/ML ~~LOC~~ SOAJ
125.0000 mg | SUBCUTANEOUS | 0 refills | Status: DC
  Filled 2022-03-08: qty 12, 84d supply, fill #0
  Filled 2022-03-16: qty 4, 28d supply, fill #0
  Filled 2022-04-10: qty 4, 28d supply, fill #1
  Filled 2022-05-10: qty 4, 28d supply, fill #2

## 2022-03-08 NOTE — Telephone Encounter (Signed)
Please schedule patient a follow up visit. Patient due December 2023. Thanks!

## 2022-03-08 NOTE — Telephone Encounter (Signed)
Next Visit: Due December 2023. Message sent to the front to schedule.    Last Visit: 08/31/2021   Last Fill: 11/28/2021   PN:DLOPRAFOAD arthritis involving multiple sites with positive rheumatoid factor    Current Dose per office note 08/31/2021: Orencia 125 mg subcutaneous weekly    Labs: 12/27/2021 CBC WNL.  ALT is borderline elevated but stable.  Rest of CMP WNL  TB Gold: 12/27/2021  Okay to refill Orencia?

## 2022-03-16 ENCOUNTER — Other Ambulatory Visit: Payer: Self-pay

## 2022-03-16 ENCOUNTER — Other Ambulatory Visit (HOSPITAL_COMMUNITY): Payer: Self-pay

## 2022-04-10 ENCOUNTER — Other Ambulatory Visit (HOSPITAL_COMMUNITY): Payer: Self-pay

## 2022-04-11 ENCOUNTER — Other Ambulatory Visit: Payer: Self-pay | Admitting: Rheumatology

## 2022-04-11 ENCOUNTER — Other Ambulatory Visit (HOSPITAL_COMMUNITY): Payer: Self-pay

## 2022-04-11 MED ORDER — LEFLUNOMIDE 20 MG PO TABS
20.0000 mg | ORAL_TABLET | Freq: Every day | ORAL | 0 refills | Status: DC
  Filled 2022-04-11: qty 30, 30d supply, fill #0

## 2022-04-11 NOTE — Telephone Encounter (Signed)
Patient requested prescription refill for Leflunomide to be sent to [place].  Patient plans to come in tomorrow for labwork and will schedule a follow-up when she gets next months work schedule.

## 2022-04-11 NOTE — Telephone Encounter (Signed)
Next Visit: per patient, she will schedule once she receives her February work schedule.   Last Visit: 08/31/2021  Last Fill: 02/28/2022  DX: Rheumatoid arthritis involving multiple sites with positive rheumatoid factor   Current Dose per office note on 08/31/2021: Arava 20 mg by mouth daily.   Labs: 12/27/2021 CBC WNL. ALT is borderline elevated but stable.  Rest of CMP WNL.  We will continue to monitor.  Per patient, she will update labs tomorrow.   Okay to refill 30 day supply of arava?

## 2022-04-12 ENCOUNTER — Other Ambulatory Visit: Payer: Self-pay

## 2022-04-12 DIAGNOSIS — Z79899 Other long term (current) drug therapy: Secondary | ICD-10-CM

## 2022-04-13 LAB — CBC WITH DIFFERENTIAL/PLATELET
Absolute Monocytes: 509 cells/uL (ref 200–950)
Basophils Absolute: 38 cells/uL (ref 0–200)
Basophils Relative: 0.8 %
Eosinophils Absolute: 139 cells/uL (ref 15–500)
Eosinophils Relative: 2.9 %
HCT: 39.9 % (ref 35.0–45.0)
Hemoglobin: 13.6 g/dL (ref 11.7–15.5)
Lymphs Abs: 1200 cells/uL (ref 850–3900)
MCH: 31.3 pg (ref 27.0–33.0)
MCHC: 34.1 g/dL (ref 32.0–36.0)
MCV: 91.7 fL (ref 80.0–100.0)
MPV: 9.8 fL (ref 7.5–12.5)
Monocytes Relative: 10.6 %
Neutro Abs: 2914 cells/uL (ref 1500–7800)
Neutrophils Relative %: 60.7 %
Platelets: 314 10*3/uL (ref 140–400)
RBC: 4.35 10*6/uL (ref 3.80–5.10)
RDW: 12 % (ref 11.0–15.0)
Total Lymphocyte: 25 %
WBC: 4.8 10*3/uL (ref 3.8–10.8)

## 2022-04-13 LAB — COMPLETE METABOLIC PANEL WITH GFR
AG Ratio: 2 (calc) (ref 1.0–2.5)
ALT: 24 U/L (ref 6–29)
AST: 21 U/L (ref 10–35)
Albumin: 4.3 g/dL (ref 3.6–5.1)
Alkaline phosphatase (APISO): 58 U/L (ref 37–153)
BUN: 11 mg/dL (ref 7–25)
CO2: 29 mmol/L (ref 20–32)
Calcium: 9.4 mg/dL (ref 8.6–10.4)
Chloride: 104 mmol/L (ref 98–110)
Creat: 0.61 mg/dL (ref 0.50–1.05)
Globulin: 2.1 g/dL (calc) (ref 1.9–3.7)
Glucose, Bld: 94 mg/dL (ref 65–99)
Potassium: 4.5 mmol/L (ref 3.5–5.3)
Sodium: 141 mmol/L (ref 135–146)
Total Bilirubin: 0.5 mg/dL (ref 0.2–1.2)
Total Protein: 6.4 g/dL (ref 6.1–8.1)
eGFR: 102 mL/min/{1.73_m2} (ref >=60)

## 2022-04-13 NOTE — Progress Notes (Signed)
CBC and CMP WNL

## 2022-04-18 DIAGNOSIS — D225 Melanocytic nevi of trunk: Secondary | ICD-10-CM | POA: Diagnosis not present

## 2022-04-18 DIAGNOSIS — D2262 Melanocytic nevi of left upper limb, including shoulder: Secondary | ICD-10-CM | POA: Diagnosis not present

## 2022-04-18 DIAGNOSIS — L570 Actinic keratosis: Secondary | ICD-10-CM | POA: Diagnosis not present

## 2022-04-18 DIAGNOSIS — Z85828 Personal history of other malignant neoplasm of skin: Secondary | ICD-10-CM | POA: Diagnosis not present

## 2022-04-18 DIAGNOSIS — L814 Other melanin hyperpigmentation: Secondary | ICD-10-CM | POA: Diagnosis not present

## 2022-04-18 DIAGNOSIS — D2271 Melanocytic nevi of right lower limb, including hip: Secondary | ICD-10-CM | POA: Diagnosis not present

## 2022-04-18 DIAGNOSIS — D2272 Melanocytic nevi of left lower limb, including hip: Secondary | ICD-10-CM | POA: Diagnosis not present

## 2022-04-18 DIAGNOSIS — L821 Other seborrheic keratosis: Secondary | ICD-10-CM | POA: Diagnosis not present

## 2022-04-18 DIAGNOSIS — D2261 Melanocytic nevi of right upper limb, including shoulder: Secondary | ICD-10-CM | POA: Diagnosis not present

## 2022-04-18 DIAGNOSIS — Z85820 Personal history of malignant melanoma of skin: Secondary | ICD-10-CM | POA: Diagnosis not present

## 2022-04-20 ENCOUNTER — Other Ambulatory Visit (HOSPITAL_COMMUNITY): Payer: Self-pay

## 2022-04-24 ENCOUNTER — Other Ambulatory Visit (HOSPITAL_COMMUNITY): Payer: Self-pay

## 2022-04-25 ENCOUNTER — Other Ambulatory Visit (HOSPITAL_COMMUNITY): Payer: Self-pay

## 2022-04-28 ENCOUNTER — Other Ambulatory Visit (HOSPITAL_COMMUNITY): Payer: Self-pay

## 2022-05-10 ENCOUNTER — Other Ambulatory Visit (HOSPITAL_COMMUNITY): Payer: Self-pay

## 2022-05-15 ENCOUNTER — Other Ambulatory Visit: Payer: Self-pay | Admitting: Physician Assistant

## 2022-05-15 ENCOUNTER — Other Ambulatory Visit (HOSPITAL_COMMUNITY): Payer: Self-pay

## 2022-05-17 ENCOUNTER — Other Ambulatory Visit (HOSPITAL_COMMUNITY): Payer: Self-pay

## 2022-05-18 ENCOUNTER — Telehealth: Payer: Self-pay

## 2022-05-18 ENCOUNTER — Other Ambulatory Visit: Payer: Self-pay

## 2022-05-18 ENCOUNTER — Other Ambulatory Visit (HOSPITAL_COMMUNITY): Payer: Self-pay

## 2022-05-18 ENCOUNTER — Other Ambulatory Visit: Payer: Self-pay | Admitting: *Deleted

## 2022-05-18 MED ORDER — LEFLUNOMIDE 20 MG PO TABS
20.0000 mg | ORAL_TABLET | Freq: Every day | ORAL | 1 refills | Status: DC
  Filled 2022-05-18: qty 30, 30d supply, fill #0
  Filled 2022-06-19: qty 30, 30d supply, fill #1

## 2022-05-18 NOTE — Telephone Encounter (Signed)
Patient contacted the office to schedule her follow up visit. Patient requested a refill on Arava.  Next Visit: 06/27/2022  Last Visit: 08/31/2021  Last Fill: 04/11/2022 (30 day supply)  DX: Rheumatoid arthritis involving multiple sites with positive rheumatoid factor    Current Dose per office note 08/31/2021: Arava 20 mg by mouth daily.    Labs: 04/12/2022 CBC and CMP WNL   Okay to refill Arava?

## 2022-05-18 NOTE — Telephone Encounter (Signed)
Received notification from [facility] that pt needs a new PA.  Submitted a Prior Authorization request to [facility] for [facility] via CoverMyMeds. Will update once we receive a response.  Key: [id number]

## 2022-05-18 NOTE — Telephone Encounter (Signed)
Received notification from [facility] regarding a prior authorization for [facility]. Authorization has been APPROVED from 05/18/22 to 11/18/2022 (? - approval dates not in [place] approval letter). Approval letter sent to scan center.  Patient must continue to fill through [place]: [phone]   Authorization # [id number]  [employer] updated and [facility] notified  Knox Saliva, PharmD, MPH, BCPS, CPP Clinical Pharmacist (Rheumatology and Pulmonology)

## 2022-05-18 NOTE — Telephone Encounter (Signed)
Per CMM the authorization is in place from 05/18/2022 to 05/18/2023.

## 2022-05-21 ENCOUNTER — Other Ambulatory Visit (HOSPITAL_COMMUNITY): Payer: Self-pay

## 2022-05-21 ENCOUNTER — Other Ambulatory Visit: Payer: Self-pay

## 2022-05-24 ENCOUNTER — Other Ambulatory Visit (HOSPITAL_COMMUNITY): Payer: Self-pay

## 2022-06-13 ENCOUNTER — Other Ambulatory Visit: Payer: Self-pay | Admitting: Physician Assistant

## 2022-06-13 ENCOUNTER — Other Ambulatory Visit: Payer: Self-pay

## 2022-06-13 ENCOUNTER — Other Ambulatory Visit (HOSPITAL_COMMUNITY): Payer: Self-pay

## 2022-06-13 DIAGNOSIS — M0579 Rheumatoid arthritis with rheumatoid factor of multiple sites without organ or systems involvement: Secondary | ICD-10-CM

## 2022-06-13 MED ORDER — ORENCIA CLICKJECT 125 MG/ML ~~LOC~~ SOAJ
125.0000 mg | SUBCUTANEOUS | 0 refills | Status: DC
  Filled 2022-06-13: qty 4, 28d supply, fill #0

## 2022-06-13 NOTE — Progress Notes (Unsigned)
Office Visit Note  Patient: Kara Campbell             Date of Birth: 1961/01/05           MRN: [id number]             PCP: Lewis Moccasin, MD Referring: Lewis Moccasin, MD Visit Date: 06/27/2022 Occupation: @  Subjective:  Medication monitoring   History of Present Illness: Kara Campbell is a 62 y.o. female with history of seropositive rheumatoid arthritis and osteoarthritis.  Patient remains on Orencia 125 mg subcutaneous injections once weekly (started December 18, 2019) and Arava 20 mg by mouth daily.  She is tolerating combination therapy without any side effects or injection site reactions from Orencia.  She has not missed any doses recently.  She has not had any recent or recurrent infections. Patient reports that she recently had an episode of discomfort in her right shoulder and right hip.  She denies any groin pain but states the pain is more in the piriformis muscle and over the right trochanteric bursa.  During that time she was more active than usual walking on a regular basis while on vacation which she feels exacerbated her symptoms.  Since then her symptoms have resolved.  She has not any recurrence.  She denies any joint swelling.  She continues to have some discomfort in the hammertoes in her feet but has not yet established care at [facility].     Activities of Daily Living:  Patient reports morning stiffness for 0 minutes.   Patient Denies nocturnal pain.  Difficulty dressing/grooming: Denies Difficulty climbing stairs: Denies Difficulty getting out of chair: Denies Difficulty using hands for taps, buttons, cutlery, and/or writing: Denies  Review of Systems  Constitutional:  Positive for fatigue.  HENT:  Negative for mouth sores and mouth dryness.   Eyes:  Negative for dryness.  Respiratory:  Negative for shortness of breath.   Cardiovascular:  Negative for chest pain and palpitations.  Gastrointestinal:  Negative for blood in stool,  constipation and diarrhea.  Endocrine: Negative for increased urination.  Genitourinary:  Negative for involuntary urination.  Musculoskeletal:  Positive for joint pain, joint pain, myalgias and myalgias. Negative for gait problem, joint swelling, muscle weakness, morning stiffness and muscle tenderness.  Skin:  Positive for rash. Negative for color change, hair loss and sensitivity to sunlight.  Allergic/Immunologic: Negative for susceptible to infections.  Neurological:  Negative for dizziness and headaches.  Hematological:  Negative for swollen glands.  Psychiatric/Behavioral:  Negative for depressed mood and sleep disturbance. The patient is not nervous/anxious.     PMFS History:  Patient Active Problem List   Diagnosis Date Noted   History of hypertension 10/16/2016   Essential hypertension 09/11/2016   Rheumatoid arthritis involving multiple sites with positive rheumatoid factor 01/23/2016   High risk medication use 01/23/2016   Primary osteoarthritis of both hands 01/23/2016   Primary osteoarthritis of both feet 01/23/2016   Malignant melanoma 01/23/2016   Basal cell carcinoma 01/23/2016   Anxiety 01/23/2016   Chest congestion 06/07/2015    Past Medical History:  Diagnosis Date   Arthritis    COVID-19    Melanoma rhe   Rheumatoid arthritis(714.0)     Family History  Problem Relation Age of Onset   Cancer Mother        breast   Breast cancer Mother 29   Rheum arthritis Other    Past Surgical History:  Procedure Laterality Date  CESAREAN SECTION     INJECTION KNEE Right    Fluid removed from knee x2    MELANOMA EXCISION     SKIN BIOPSY     back of right leg, right buttocks, right chest, left cheek    Social History   Social History Narrative   Not on file   Immunization History  Administered Date(s) Administered   PFIZER(Purple Top)SARS-COV-2 Vaccination 03/01/2019, 03/22/2019, 11/24/2019     Objective: Vital Signs: BP 126/80 (BP Location: Right Arm,  Patient Position: Sitting, Cuff Size: Normal)   Pulse 61   Resp 16   Ht 5\' 4"  (1.626 m)   Wt 162 lb 12.8 oz (73.8 kg)   BMI 27.94 kg/m    Physical Exam Vitals and nursing note reviewed.  Constitutional:      Appearance: She is well-developed.  HENT:     Head: Normocephalic and atraumatic.  Eyes:     Conjunctiva/sclera: Conjunctivae normal.  Cardiovascular:     Rate and Rhythm: Normal rate and regular rhythm.     Heart sounds: Normal heart sounds.  Pulmonary:     Effort: Pulmonary effort is normal.     Breath sounds: Normal breath sounds.  Abdominal:     General: Bowel sounds are normal.     Palpations: Abdomen is soft.  Musculoskeletal:     Cervical back: Normal range of motion.  Lymphadenopathy:     Cervical: No cervical adenopathy.  Skin:    General: Skin is warm and dry.     Capillary Refill: Capillary refill takes less than 2 seconds.  Neurological:     Mental Status: She is alert and oriented to person, place, and time.  Psychiatric:        Behavior: Behavior normal.      Musculoskeletal Exam: C-spine, thoracic spine, lumbar spine have good range of motion.  No midline spinal tenderness.  No SI joint tenderness.  No tenderness over the paraspinal muscles.  No tenderness over the piriformis muscles bilaterally.  Shoulder joints, elbow joints, wrist joints, MCPs, PIPs, DIPs have good range of motion with no synovitis.  Synovial thickening over MCP joints but no active synovitis was noted.  PIP and DIP prominence consistent with osteoarthritis of both hands.  Hip joints have good range of motion with no groin pain.  No tenderness over the trochanteric bursa bilaterally.  Knee joints have good range of motion with no warmth or effusion.  Ankle joints have good range of motion with no tenderness or joint swelling.  Pes cavus noted bilaterally.  Hammertoes noted bilaterally.  CDAI Exam: CDAI Score: -- Patient Global: 1 mm; Provider Global: 1 mm Swollen: --; Tender:  -- Joint Exam 06/27/2022   No joint exam has been documented for this visit   There is currently no information documented on the homunculus. Go to the Rheumatology activity and complete the homunculus joint exam.  Investigation: No additional findings.  Imaging: No results found.  Recent Labs: Lab Results  Component Value Date   WBC 4.8 04/12/2022   HGB 13.6 04/12/2022   PLT 314 04/12/2022   NA 141 04/12/2022   K 4.5 04/12/2022   CL 104 04/12/2022   CO2 29 04/12/2022   GLUCOSE 94 04/12/2022   BUN 11 04/12/2022   CREATININE 0.61 04/12/2022   BILITOT 0.5 04/12/2022   ALKPHOS 59 10/23/2016   AST 21 04/12/2022   ALT 24 04/12/2022   PROT 6.4 04/12/2022   ALBUMIN 4.2 10/23/2016   CALCIUM 9.4 04/12/2022  GFRAA 112 07/28/2020   QFTBGOLDPLUS NEGATIVE 12/27/2021    Speciality Comments: PLQ Eye exam: 04/13/19 WNL @ [facility] Follow up in 1 year Orencia started October 2021.  Plaquenil discontinued due to improvement  Procedures:  No procedures performed Allergies: Amoxil [amoxicillin]   Assessment / Plan:     Visit Diagnoses: Rheumatoid arthritis involving multiple sites with positive rheumatoid factor (HCC) Erosive disease - Erosive disease: She has no synovitis on examination today.  No signs or symptoms of a recent rheumatoid arthritis flare.  She has clinically been doing well on Orencia 125 mg subcutaneous injections once weekly and Arava 20 mg 1 tablet by mouth daily.  She is tolerating combination therapy without any side effects and has not missed any doses recently.  No recent or recurrent infections.  She recently had a bout of discomfort in her right knee and some discomfort in the right trochanteric bursa and piriformis muscle on the right side.  She attributed her increased discomfort to increasing her activity level.  On examination she had no tenderness upon palpation and had no warmth or effusion in the right knee joint.  Her symptoms have subsided.  She is  advised to notify us if her symptoms return at which time she can return for a cortisone injection or we can refer her to physical therapy.  For now she will remain on Orencia and Arava as prescribed for management of rheumatoid arthritis.  She was advised to notify us if she develops signs or symptoms of a flare.  She will follow-up in the office in 5 months or sooner if needed.  High risk medication use - Orencia 125 mg subcutaneous weekly (started 12/18/2019) and Arava 20 mg by mouth daily.  (Plaquenil discontinued March 2022-no improvement). TB gold negative on 12/27/21.  CBC and CMP updated on 04/12/22.  Her next lab work will be due in May.  Standing orders for CBC and CMP remain in place. No recent or recurrent infections.  Discussed the importance of holding orencia and arava if she develops signs or symptoms of an infection and to resume once the infection has completely cleared.   Primary osteoarthritis of both hands: She has PIP and DIP thickening consistent with osteoarthritis of both hands.  Some CMC joint prominence noted bilaterally.  No tenderness or inflammation was noted today.  Complete fist formation noted.  Lateral epicondylitis, right elbow: Resolved.   Primary osteoarthritis of both feet: Hammertoes noted.  Patient has interval discomfort in her feet.  She had no tenderness or synovitis over the MTP joints.  Good range of motion of both ankle joints with no tenderness or synovitis.  She has not yet established care with [facility].  Discussed the importance of wearing proper fitting shoes.  Pes cavus of both feet  Osteopenia of multiple sites - DEXA updated on 02/25/20: LFN BMD 0.707 with T-score -1.3.  She is taking vitamin D 2000 units daily.  Patient was due to update DEXA in December 2023.  Other medical conditions are listed as follows:  History of melanoma  History of hypertension: Blood pressure was 126/80 today in the office.  History of basal cell  cancer  History of squamous cell carcinoma  Anxiety  Orders: No orders of the defined types were placed in this encounter.  No orders of the defined types were placed in this encounter.    Follow-Up Instructions: Return in about 5 months (around 11/27/2022) for Rheumatoid arthritis, Osteoarthritis.   Ladona Ridgel  Haskel Schroeder, PA-C  Note - This record has been created using [employer].  Chart creation errors have been sought, but may not always  have been located. Such creation errors do not reflect on  the standard of medical care.

## 2022-06-13 NOTE — Telephone Encounter (Signed)
Last Fill: 03/08/2022  Labs: 04/12/2022 CBC and CMP WNL   TB Gold: 12/27/2021   Next Visit: 06/27/2022  Last Visit: 08/31/2021  [id number] arthritis involving multiple sites with positive rheumatoid factor   Current Dose per office note 09/01/2021: Orencia 125 mg subcutaneous weekly   Okay to refill Orencia?

## 2022-06-15 ENCOUNTER — Other Ambulatory Visit (HOSPITAL_COMMUNITY): Payer: Self-pay

## 2022-06-18 ENCOUNTER — Other Ambulatory Visit (HOSPITAL_COMMUNITY): Payer: Self-pay

## 2022-06-20 ENCOUNTER — Other Ambulatory Visit (HOSPITAL_COMMUNITY): Payer: Self-pay

## 2022-06-27 ENCOUNTER — Ambulatory Visit: Payer: Commercial Managed Care - PPO | Attending: Physician Assistant | Admitting: Physician Assistant

## 2022-06-27 ENCOUNTER — Encounter: Payer: Self-pay | Admitting: Physician Assistant

## 2022-06-27 VITALS — BP 126/80 | HR 61 | Resp 16 | Ht 64.0 in | Wt 162.8 lb

## 2022-06-27 DIAGNOSIS — F419 Anxiety disorder, unspecified: Secondary | ICD-10-CM

## 2022-06-27 DIAGNOSIS — M8589 Other specified disorders of bone density and structure, multiple sites: Secondary | ICD-10-CM

## 2022-06-27 DIAGNOSIS — M7711 Lateral epicondylitis, right elbow: Secondary | ICD-10-CM

## 2022-06-27 DIAGNOSIS — M19041 Primary osteoarthritis, right hand: Secondary | ICD-10-CM | POA: Diagnosis not present

## 2022-06-27 DIAGNOSIS — Z8589 Personal history of malignant neoplasm of other organs and systems: Secondary | ICD-10-CM

## 2022-06-27 DIAGNOSIS — M0579 Rheumatoid arthritis with rheumatoid factor of multiple sites without organ or systems involvement: Secondary | ICD-10-CM

## 2022-06-27 DIAGNOSIS — Z8679 Personal history of other diseases of the circulatory system: Secondary | ICD-10-CM

## 2022-06-27 DIAGNOSIS — Z85828 Personal history of other malignant neoplasm of skin: Secondary | ICD-10-CM

## 2022-06-27 DIAGNOSIS — Z85820 Personal history of malignant melanoma of skin: Secondary | ICD-10-CM

## 2022-06-27 DIAGNOSIS — Z79899 Other long term (current) drug therapy: Secondary | ICD-10-CM | POA: Diagnosis not present

## 2022-06-27 DIAGNOSIS — M19071 Primary osteoarthritis, right ankle and foot: Secondary | ICD-10-CM

## 2022-06-27 DIAGNOSIS — M19042 Primary osteoarthritis, left hand: Secondary | ICD-10-CM

## 2022-06-27 DIAGNOSIS — Q6672 Congenital pes cavus, left foot: Secondary | ICD-10-CM

## 2022-06-27 DIAGNOSIS — Q6671 Congenital pes cavus, right foot: Secondary | ICD-10-CM | POA: Diagnosis not present

## 2022-06-27 DIAGNOSIS — M19072 Primary osteoarthritis, left ankle and foot: Secondary | ICD-10-CM

## 2022-06-27 NOTE — Patient Instructions (Addendum)
Standing Labs We placed an order today for your standing lab work.   Please have your standing labs drawn in May and every 3 months   Please have your labs drawn 2 weeks prior to your appointment so that the provider can discuss your lab results at your appointment, if possible.  Please note that you may see your imaging and lab results in MyChart before we have reviewed them. We will contact you once all results are reviewed. Please allow our office up to 72 hours to thoroughly review all of the results before contacting the office for clarification of your results.  WALK-IN LAB HOURS  Monday through Thursday from 8:00 am -12:30 pm and 1:00 pm-5:00 pm and Friday from 8:00 am-12:00 pm.  Patients with office visits requiring labs will be seen before walk-in labs.  You may encounter longer than normal wait times. Please allow additional time. Wait times may be shorter on  Monday and Thursday afternoons.  We do not book appointments for walk-in labs. We appreciate your patience and understanding with our staff.   Labs are drawn by [facility]. Please bring your co-pay at the time of your lab draw.  You may receive a bill from [facility] for your lab work.  Please note if you are on Hydroxychloroquine and and an order has been placed for a Hydroxychloroquine level,  you will need to have it drawn 4 hours or more after your last dose.  If you wish to have your labs drawn at another location, please call the office 24 hours in advance so we can fax the orders.  The office is located at [address], [address], [place], [place] [address]   If you have any questions regarding directions or hours of operation,  please call [phone].   As a reminder, please drink plenty of water prior to coming for your lab work. Thanks!   Hip Bursitis Rehab Ask your health care provider which exercises are safe for you. Do exercises exactly as told by your health care provider and adjust them as directed. It is  normal to feel mild stretching, pulling, tightness, or discomfort as you do these exercises. Stop right away if you feel sudden pain or your pain gets worse. Do not begin these exercises until told by your health care provider. Stretching exercise This exercise warms up your muscles and joints and improves the movement and flexibility of your hip. This exercise also helps to relieve pain and stiffness. Iliotibial band stretch An iliotibial band is a strong band of muscle tissue that runs from the outer side of your hip to the outer side of your thigh and knee. Lie on your side with your left / right leg in the top position. Bend your left / right knee and grab your ankle. Stretch out your bottom arm to help you balance. Slowly bring your knee back so your thigh is slightly behind your body. Slowly lower your knee toward the floor until you feel a gentle stretch on the outside of your left / right thigh. If you do not feel a stretch and your knee will not lower more toward the floor, place the heel of your other foot on top of your knee and pull your knee down toward the floor with your foot. Hold this position for __________ seconds. Slowly return to the starting position. Repeat __________ times. Complete this exercise __________ times a day. Strengthening exercises These exercises build strength and endurance in your hip and pelvis. Endurance is  the ability to use your muscles for a long time, even after they get tired. Bridge This exercise strengthens the muscles that move your thigh backward (hip extensors). Lie on your back on a firm surface with your knees bent and your feet flat on the floor. Tighten your buttocks muscles and lift your buttocks off the floor until your trunk is level with your thighs. Do not arch your back. You should feel the muscles working in your buttocks and the back of your thighs. If you do not feel these muscles, slide your feet 1-2 inches (2.5-5 cm) farther away  from your buttocks. If this exercise is too easy, try doing it with your arms crossed over your chest. Hold this position for __________ seconds. Slowly lower your hips to the starting position. Let your muscles relax completely after each repetition. Repeat __________ times. Complete this exercise __________ times a day. Squats This exercise strengthens the muscles in front of your thigh and knee (quadriceps). Stand in front of a table, with your feet and knees pointing straight ahead. You may rest your hands on the table for balance but not for support. Slowly bend your knees and lower your hips like you are going to sit in a chair. Keep your weight over your heels, not over your toes. Keep your lower legs upright so they are parallel with the table legs. Do not let your hips go lower than your knees. Do not bend lower than told by your health care provider. If your hip pain increases, do not bend as low. Hold the squat position for __________ seconds. Slowly push with your legs to return to standing. Do not use your hands to pull yourself to standing. Repeat __________ times. Complete this exercise __________ times a day. Hip hike  Stand sideways on a bottom step. Stand on your left / right leg with your other foot unsupported next to the step. You can hold on to the railing or wall for balance if needed. Keep your knees straight and your torso square. Then lift your left / right hip up toward the ceiling. Hold this position for __________ seconds. Slowly let your left / right hip lower toward the floor, past the starting position. Your foot should get closer to the floor. Do not lean or bend your knees. Repeat __________ times. Complete this exercise __________ times a day. Single leg stand This exercise increases your balance. Without shoes, stand near a railing or in a doorway. You may hold on to the railing or door frame as needed for balance. Squeeze your left / right buttock  muscles, then lift up your other foot. Do not let your left / right hip push out to the side. It is helpful to stand in front of a mirror for this exercise so you can watch your hip. Hold this position for __________ seconds. Repeat __________ times. Complete this exercise __________ times a day. This information is not intended to replace advice given to you by your health care provider. Make sure you discuss any questions you have with your health care provider. Document Revised: 02/08/2021 Document Reviewed: 02/08/2021 Elsevier Patient Education  2023 [employer]  Piriformis Syndrome Rehab Ask your health care provider which exercises are safe for you. Do exercises exactly as told by your health care provider and adjust them as directed. It is normal to feel mild stretching, pulling, tightness, or discomfort as you do these exercises. Stop right away if you feel sudden pain or your pain gets  worse. Do not begin these exercises until told by your health care provider. Stretching and range-of-motion exercises These exercises warm up your muscles and joints and improve the movement and flexibility of your hip and pelvis. The exercises also help to relieve pain, numbness, and tingling. Nerve root  Sit on a firm surface that is high enough that you can swing your left / right foot freely. Place a folded towel under your left / right thigh. This is optional. Drop your head forward and round your back. While you keep your left / right foot relaxed, slowly straighten your left / right knee until you feel a slight pull behind your knee or calf. If your leg is fully extended and you still do not feel a pull, slowly tilt your foot and toes toward you. Hold this position for __________ seconds. Slowly return your knee to its starting position. Hip rotation This is an exercise in which you lie on your back and stretch the muscles that rotate your hip (hip rotators) to stretch your buttocks. Lie on  your back on a firm surface. Pull your left / right knee toward your same shoulder with your left / right hand until your knee is pointing toward the ceiling. Hold your left / right ankle with your other hand. Keeping your knee steady, gently pull your left / right ankle toward your other shoulder until you feel a stretch in your buttocks. Hold this position for __________ seconds. Repeat __________ times. Complete this exercise __________ times a day. Hip extensor This is an exercise in which you lie on your back and pull your knee to your chest. Lie on your back on a firm surface. Both of your legs should be straight. Pull your left / right knee to your chest. Hold your leg in this position by holding on to the back of your thigh or the front of your knee. Hold this position for __________ seconds. Slowly return to the starting position. Repeat __________ times. Complete this exercise __________ times a day. Strengthening exercises These exercises build strength and endurance in your hip and thigh muscles. Endurance is the ability to use your muscles for a long time, even after they get tired. Straight leg raises, side-lying This exercise strengthens the muscles that rotate the leg at the hip and move it away from your body (hip abductors). Lie on your side with your left / right leg in the top position. Lie so your head, shoulder, knee, and hip line up. Bend your bottom knee to help you balance. Lift your top leg 4-6 inches (10-15 cm) while keeping your toes pointed straight ahead. Hold this position for __________ seconds. Slowly lower your leg to the starting position. Let your muscles relax completely after each repetition. Repeat __________ times. Complete this exercise __________ times a day. Hip abduction and rotation This is sometimes called quadruped (on hands and knees) exercises. Get on your hands and knees on a firm, lightly padded surface. Your hands should be directly below  your shoulders, and your knees should be directly below your hips. Lift your left / right knee out to the side. Keep your knee bent. Do not twist your body. Hold this position for __________ seconds. Slowly lower your leg. Repeat __________ times. Complete this exercise __________ times a day. Straight leg raises, prone This exercise stretches the muscles that move the hips (hip extensors). Lie on your abdomen on a firm surface (prone position). Tense the muscles in your buttocks and lift your  left / right leg about 4 inches (10 cm). Keep your knee straight as you lift your leg. If you cannot lift your leg that high without arching your back, place a pillow under your hips. Hold this position for __________ seconds. Slowly lower your leg to the starting position. Let your muscles relax completely after each repetition. Repeat __________ times. Complete this exercise __________ times a day. This information is not intended to replace advice given to you by your health care provider. Make sure you discuss any questions you have with your health care provider. Document Revised: 08/30/2020 Document Reviewed: 08/30/2020 Elsevier Patient Education  2023 [employer].

## 2022-07-06 ENCOUNTER — Other Ambulatory Visit: Payer: Self-pay

## 2022-07-09 ENCOUNTER — Other Ambulatory Visit: Payer: Self-pay | Admitting: Rheumatology

## 2022-07-09 ENCOUNTER — Other Ambulatory Visit: Payer: Self-pay

## 2022-07-09 DIAGNOSIS — M0579 Rheumatoid arthritis with rheumatoid factor of multiple sites without organ or systems involvement: Secondary | ICD-10-CM

## 2022-07-09 MED ORDER — ORENCIA CLICKJECT 125 MG/ML ~~LOC~~ SOAJ
125.0000 mg | SUBCUTANEOUS | 0 refills | Status: DC
  Filled 2022-07-09: qty 4, 28d supply, fill #0
  Filled 2022-08-20 (×2): qty 4, 28d supply, fill #1
  Filled 2022-09-14: qty 4, 28d supply, fill #2

## 2022-07-09 NOTE — Telephone Encounter (Signed)
Last Fill: 06/13/2022  Labs: 04/12/2022 CBC and CMP WNL   TB Gold: 12/27/2021 negative    Next Visit: 12/12/2022  Last Visit: 06/27/2022  EA:[id number] arthritis involving multiple sites with positive rheumatoid factor (HCC) Erosive disease   Current Dose per office note on 06/27/2022: Orencia 125 mg subcutaneous weekly   Okay to refill Orencia?

## 2022-07-16 ENCOUNTER — Other Ambulatory Visit: Payer: Self-pay | Admitting: *Deleted

## 2022-07-16 DIAGNOSIS — Z79899 Other long term (current) drug therapy: Secondary | ICD-10-CM

## 2022-07-17 ENCOUNTER — Other Ambulatory Visit: Payer: Self-pay

## 2022-07-17 LAB — CBC WITH DIFFERENTIAL/PLATELET
Absolute Monocytes: 576 cells/uL (ref 200–950)
Basophils Absolute: 58 cells/uL (ref 0–200)
Basophils Relative: 0.9 %
Eosinophils Absolute: 77 cells/uL (ref 15–500)
Eosinophils Relative: 1.2 %
HCT: 37.9 % (ref 35.0–45.0)
Hemoglobin: 12.6 g/dL (ref 11.7–15.5)
Lymphs Abs: 1434 cells/uL (ref 850–3900)
MCH: 30.8 pg (ref 27.0–33.0)
MCHC: 33.2 g/dL (ref 32.0–36.0)
MCV: 92.7 fL (ref 80.0–100.0)
MPV: 10.4 fL (ref 7.5–12.5)
Monocytes Relative: 9 %
Neutro Abs: 4256 cells/uL (ref 1500–7800)
Neutrophils Relative %: 66.5 %
Platelets: 297 10*3/uL (ref 140–400)
RBC: 4.09 10*6/uL (ref 3.80–5.10)
RDW: 12.2 % (ref 11.0–15.0)
Total Lymphocyte: 22.4 %
WBC: 6.4 10*3/uL (ref 3.8–10.8)

## 2022-07-17 LAB — COMPLETE METABOLIC PANEL WITH GFR
AG Ratio: 2 (calc) (ref 1.0–2.5)
ALT: 24 U/L (ref 6–29)
AST: 24 U/L (ref 10–35)
Albumin: 4.4 g/dL (ref 3.6–5.1)
Alkaline phosphatase (APISO): 65 U/L (ref 37–153)
BUN: 19 mg/dL (ref 7–25)
CO2: 27 mmol/L (ref 20–32)
Calcium: 9.5 mg/dL (ref 8.6–10.4)
Chloride: 103 mmol/L (ref 98–110)
Creat: 0.69 mg/dL (ref 0.50–1.05)
Globulin: 2.2 g/dL (calc) (ref 1.9–3.7)
Glucose, Bld: 118 mg/dL — ABNORMAL HIGH (ref 65–99)
Potassium: 4.2 mmol/L (ref 3.5–5.3)
Sodium: 139 mmol/L (ref 135–146)
Total Bilirubin: 0.5 mg/dL (ref 0.2–1.2)
Total Protein: 6.6 g/dL (ref 6.1–8.1)
eGFR: 99 mL/min/{1.73_m2} (ref >=60)

## 2022-07-17 NOTE — Progress Notes (Signed)
Glucose is 118.  Rest of CMP WNL.  CBC WNL.

## 2022-07-23 ENCOUNTER — Other Ambulatory Visit: Payer: Self-pay | Admitting: Rheumatology

## 2022-07-23 ENCOUNTER — Other Ambulatory Visit (HOSPITAL_COMMUNITY): Payer: Self-pay

## 2022-07-23 MED ORDER — LEFLUNOMIDE 20 MG PO TABS
20.0000 mg | ORAL_TABLET | Freq: Every day | ORAL | 0 refills | Status: DC
  Filled 2022-07-23: qty 30, 30d supply, fill #0

## 2022-07-23 NOTE — Telephone Encounter (Signed)
Last Fill: 05/18/2022  Labs: 07/16/2022 Glucose is 118. Rest of CMP WNL CBC WNL  Next Visit: 12/12/2022  Last Visit: 06/27/2022   DX: Rheumatoid arthritis involving multiple sites with positive rheumatoid factor (HCC) Erosive disease   Current Dose per office note 06/27/2022:  Arava 20 mg by mouth daily.   Okay to refill Arava ?

## 2022-07-24 ENCOUNTER — Other Ambulatory Visit: Payer: Self-pay

## 2022-07-26 ENCOUNTER — Other Ambulatory Visit (HOSPITAL_COMMUNITY): Payer: Self-pay

## 2022-07-26 MED ORDER — PAROXETINE HCL 20 MG PO TABS
20.0000 mg | ORAL_TABLET | Freq: Every morning | ORAL | 0 refills | Status: DC
  Filled 2022-07-26: qty 30, 30d supply, fill #0

## 2022-08-02 ENCOUNTER — Other Ambulatory Visit (HOSPITAL_COMMUNITY): Payer: Self-pay

## 2022-08-02 DIAGNOSIS — J309 Allergic rhinitis, unspecified: Secondary | ICD-10-CM | POA: Diagnosis not present

## 2022-08-02 DIAGNOSIS — F331 Major depressive disorder, recurrent, moderate: Secondary | ICD-10-CM | POA: Diagnosis not present

## 2022-08-02 DIAGNOSIS — I10 Essential (primary) hypertension: Secondary | ICD-10-CM | POA: Diagnosis not present

## 2022-08-02 DIAGNOSIS — G4700 Insomnia, unspecified: Secondary | ICD-10-CM | POA: Diagnosis not present

## 2022-08-02 DIAGNOSIS — F411 Generalized anxiety disorder: Secondary | ICD-10-CM | POA: Diagnosis not present

## 2022-08-02 DIAGNOSIS — E559 Vitamin D deficiency, unspecified: Secondary | ICD-10-CM | POA: Diagnosis not present

## 2022-08-02 MED ORDER — VALSARTAN-HYDROCHLOROTHIAZIDE 320-25 MG PO TABS
1.0000 | ORAL_TABLET | Freq: Every day | ORAL | 3 refills | Status: DC
  Filled 2022-08-02: qty 90, 90d supply, fill #0
  Filled 2022-11-06: qty 90, 90d supply, fill #1
  Filled 2023-01-30: qty 90, 90d supply, fill #2
  Filled 2023-05-04: qty 90, 90d supply, fill #3

## 2022-08-02 MED ORDER — PAROXETINE HCL 20 MG PO TABS
20.0000 mg | ORAL_TABLET | Freq: Every morning | ORAL | 3 refills | Status: DC
  Filled 2022-08-02 – 2022-08-20 (×2): qty 90, 90d supply, fill #0
  Filled 2022-11-25: qty 90, 90d supply, fill #1
  Filled 2023-02-26: qty 90, 90d supply, fill #2
  Filled 2023-05-30: qty 90, 90d supply, fill #3

## 2022-08-08 ENCOUNTER — Other Ambulatory Visit (HOSPITAL_COMMUNITY): Payer: Self-pay

## 2022-08-17 ENCOUNTER — Other Ambulatory Visit (HOSPITAL_COMMUNITY): Payer: Self-pay

## 2022-08-20 ENCOUNTER — Encounter (HOSPITAL_COMMUNITY): Payer: Self-pay

## 2022-08-20 ENCOUNTER — Other Ambulatory Visit: Payer: Self-pay | Admitting: Rheumatology

## 2022-08-20 ENCOUNTER — Other Ambulatory Visit: Payer: Self-pay

## 2022-08-20 ENCOUNTER — Other Ambulatory Visit (HOSPITAL_COMMUNITY): Payer: Self-pay

## 2022-08-20 MED ORDER — LEFLUNOMIDE 20 MG PO TABS
20.0000 mg | ORAL_TABLET | Freq: Every day | ORAL | 2 refills | Status: DC
  Filled 2022-08-20: qty 90, 90d supply, fill #0

## 2022-08-20 NOTE — Telephone Encounter (Signed)
Last Fill: 07/22/2021 (30 day supply)  Labs: 07/16/2022 Glucose is 118. Rest of CMP WNL CBC WNL  Next Visit: 12/12/2022  Last Visit: 06/27/2022  DX: Rheumatoid arthritis involving multiple sites with positive rheumatoid factor   Current Dose per office note 06/27/2022: Arava 20 mg by mouth daily   Okay to refill Arava ?

## 2022-08-21 ENCOUNTER — Other Ambulatory Visit (HOSPITAL_COMMUNITY): Payer: Self-pay

## 2022-08-24 ENCOUNTER — Telehealth: Payer: Self-pay | Admitting: *Deleted

## 2022-08-24 NOTE — Telephone Encounter (Signed)
Returned call to patient. Patient advised it is unlikely the rash is a drug reaction from [place] or [place] given she has been on these medications for a long period of time.  Patient states she has not had any other new medications added. Patient denies any change in products or sun exposure. Patient states she has not had a rash at the injection site after she administered Orencia. Patient is calling to schedule an appointment with dermatology.

## 2022-08-24 NOTE — Telephone Encounter (Signed)
It unlikely the rash is a drug reaction from [place] given she has been on these medications for a long period of time.  Any other new medications added? Change in products? Wynelle Link exposure?  Please clarify if she has a rash at the injection site after administered orencia?  I would recommend evaluation by dermatology.

## 2022-08-24 NOTE — Telephone Encounter (Signed)
Patient contacted the office stating that she has had a rash on her shoulders and upper back. Patient states it has been going on for a couple of weeks. Patient states previously she had a rash across her lower back. Patient states she has been taking Benadryl and Hydrocortisone cream with no relief. Patient states the rash is red and itching. Patient states she is going to call dermatology to schedule an appointment. Patient wanted to make sure this is not related to the [place] or [place]. Please advise.

## 2022-09-12 ENCOUNTER — Other Ambulatory Visit (HOSPITAL_COMMUNITY): Payer: Self-pay

## 2022-09-14 ENCOUNTER — Other Ambulatory Visit (HOSPITAL_COMMUNITY): Payer: Self-pay

## 2022-09-17 ENCOUNTER — Other Ambulatory Visit (HOSPITAL_COMMUNITY): Payer: Self-pay

## 2022-10-09 ENCOUNTER — Other Ambulatory Visit (HOSPITAL_COMMUNITY): Payer: Self-pay

## 2022-10-11 ENCOUNTER — Other Ambulatory Visit: Payer: Self-pay

## 2022-10-12 ENCOUNTER — Other Ambulatory Visit (HOSPITAL_COMMUNITY): Payer: Self-pay

## 2022-10-12 ENCOUNTER — Other Ambulatory Visit: Payer: Self-pay | Admitting: Physician Assistant

## 2022-10-12 ENCOUNTER — Other Ambulatory Visit: Payer: Self-pay

## 2022-10-12 DIAGNOSIS — M0579 Rheumatoid arthritis with rheumatoid factor of multiple sites without organ or systems involvement: Secondary | ICD-10-CM

## 2022-10-12 MED ORDER — ORENCIA CLICKJECT 125 MG/ML ~~LOC~~ SOAJ
125.0000 mg | SUBCUTANEOUS | 0 refills | Status: DC
Start: 2022-10-12 — End: 2023-01-01
  Filled 2022-10-12: qty 4, 28d supply, fill #0
  Filled 2022-11-06: qty 4, 28d supply, fill #1
  Filled 2022-11-29: qty 4, 28d supply, fill #2

## 2022-10-12 NOTE — Telephone Encounter (Signed)
Last Fill: 07/09/2022  Labs: 07/16/2022 Glucose is 118. Rest of CMP WNL CBC WNL  TB Gold: 12/27/2021 Negative   Next Visit: 12/12/2022  Last Visit: 06/27/2022   BM:[id number] arthritis involving multiple sites with positive rheumatoid factor (HCC) Erosive disease   Current Dose per office note 06/27/2022: Orencia 125 mg subcutaneous weekly (started 12/18/2019)   Okay to refill Orencia?

## 2022-11-06 ENCOUNTER — Other Ambulatory Visit (HOSPITAL_COMMUNITY): Payer: Self-pay

## 2022-11-09 ENCOUNTER — Other Ambulatory Visit (HOSPITAL_COMMUNITY): Payer: Self-pay

## 2022-11-10 ENCOUNTER — Other Ambulatory Visit (HOSPITAL_COMMUNITY): Payer: Self-pay

## 2022-11-16 ENCOUNTER — Other Ambulatory Visit (HOSPITAL_COMMUNITY): Payer: Self-pay

## 2022-11-23 ENCOUNTER — Other Ambulatory Visit: Payer: Self-pay | Admitting: *Deleted

## 2022-11-23 DIAGNOSIS — Z111 Encounter for screening for respiratory tuberculosis: Secondary | ICD-10-CM

## 2022-11-23 DIAGNOSIS — Z9225 Personal history of immunosupression therapy: Secondary | ICD-10-CM | POA: Diagnosis not present

## 2022-11-23 DIAGNOSIS — Z79899 Other long term (current) drug therapy: Secondary | ICD-10-CM

## 2022-11-25 ENCOUNTER — Other Ambulatory Visit: Payer: Self-pay | Admitting: Physician Assistant

## 2022-11-26 ENCOUNTER — Other Ambulatory Visit (HOSPITAL_COMMUNITY): Payer: Self-pay

## 2022-11-26 MED ORDER — LEFLUNOMIDE 20 MG PO TABS
20.0000 mg | ORAL_TABLET | Freq: Every day | ORAL | 2 refills | Status: DC
  Filled 2022-11-26: qty 30, 30d supply, fill #0
  Filled 2022-12-28: qty 30, 30d supply, fill #1
  Filled 2023-01-30: qty 30, 30d supply, fill #2

## 2022-11-26 NOTE — Progress Notes (Signed)
Glucose is 120.  Rest of CMP WNL.  CBC WNL.

## 2022-11-26 NOTE — Telephone Encounter (Signed)
Last Fill: 08/20/2022  Labs: 11/23/2022 Glucose is 120. Rest of CMP WNL.  CBC WNL     Next Visit: 01/07/2023  Last Visit: 06/27/2022  DX:  Rheumatoid arthritis involving multiple sites with positive rheumatoid factor   Current Dose per office note 06/27/2022 :Arava 20 mg by mouth daily   Okay to refill Arava ?

## 2022-11-27 ENCOUNTER — Other Ambulatory Visit (HOSPITAL_COMMUNITY): Payer: Self-pay

## 2022-11-27 LAB — CBC WITH DIFFERENTIAL/PLATELET
Absolute Monocytes: 390 {cells}/uL (ref 200–950)
Basophils Absolute: 52 {cells}/uL (ref 0–200)
Basophils Relative: 1.1 %
Eosinophils Absolute: 150 {cells}/uL (ref 15–500)
Eosinophils Relative: 3.2 %
HCT: 41.3 % (ref 35.0–45.0)
Hemoglobin: 13.4 g/dL (ref 11.7–15.5)
Lymphs Abs: 921 {cells}/uL (ref 850–3900)
MCH: 30 pg (ref 27.0–33.0)
MCHC: 32.4 g/dL (ref 32.0–36.0)
MCV: 92.6 fL (ref 80.0–100.0)
MPV: 10 fL (ref 7.5–12.5)
Monocytes Relative: 8.3 %
Neutro Abs: 3187 {cells}/uL (ref 1500–7800)
Neutrophils Relative %: 67.8 %
Platelets: 325 10*3/uL (ref 140–400)
RBC: 4.46 10*6/uL (ref 3.80–5.10)
RDW: 12.3 % (ref 11.0–15.0)
Total Lymphocyte: 19.6 %
WBC: 4.7 10*3/uL (ref 3.8–10.8)

## 2022-11-27 LAB — COMPLETE METABOLIC PANEL WITH GFR
AG Ratio: 2 (calc) (ref 1.0–2.5)
ALT: 28 U/L (ref 6–29)
AST: 27 U/L (ref 10–35)
Albumin: 4.5 g/dL (ref 3.6–5.1)
Alkaline phosphatase (APISO): 63 U/L (ref 37–153)
BUN: 13 mg/dL (ref 7–25)
CO2: 29 mmol/L (ref 20–32)
Calcium: 9.6 mg/dL (ref 8.6–10.4)
Chloride: 101 mmol/L (ref 98–110)
Creat: 0.73 mg/dL (ref 0.50–1.05)
Globulin: 2.3 g/dL (ref 1.9–3.7)
Glucose, Bld: 120 mg/dL — ABNORMAL HIGH (ref 65–99)
Potassium: 4 mmol/L (ref 3.5–5.3)
Sodium: 139 mmol/L (ref 135–146)
Total Bilirubin: 0.8 mg/dL (ref 0.2–1.2)
Total Protein: 6.8 g/dL (ref 6.1–8.1)
eGFR: 93 mL/min/{1.73_m2} (ref >=60)

## 2022-11-27 LAB — QUANTIFERON-TB GOLD PLUS
Mitogen-NIL: 2.03 [IU]/mL
NIL: 0.02 [IU]/mL
QuantiFERON-TB Gold Plus: NEGATIVE
TB1-NIL: 0 [IU]/mL
TB2-NIL: 0 [IU]/mL

## 2022-11-27 NOTE — Progress Notes (Signed)
TB gold negative

## 2022-11-28 ENCOUNTER — Other Ambulatory Visit (HOSPITAL_COMMUNITY): Payer: Self-pay

## 2022-11-29 ENCOUNTER — Other Ambulatory Visit (HOSPITAL_COMMUNITY): Payer: Self-pay

## 2022-12-06 ENCOUNTER — Other Ambulatory Visit: Payer: Self-pay

## 2022-12-12 ENCOUNTER — Ambulatory Visit: Payer: Commercial Managed Care - PPO | Admitting: Rheumatology

## 2022-12-25 NOTE — Progress Notes (Signed)
Office Visit Note  Patient: Kara Campbell             Date of Birth: 1960/07/26           MRN: [id number]             PCP: Lewis Moccasin, MD Referring: Lewis Moccasin, MD Visit Date: 01/07/2023 Occupation: @GUAROCC @  Subjective:  No chief complaint on file.   History of Present Illness: Kara Campbell is a 62 y.o. female with seropositive rheumatoid arthritis and osteoarthritis.  She returns today after her last visit in April 2024.  She has not had a flare of rheumatoid arthritis.  She states that right piriformis and right hip discomfort has resolved.  She continues to have some right trapezius spasm.  She states there have been a lot of stressors in the family but she has not had any  flares.  She is on Orencia 125 mg subcu weekly and leflunomide 20 mg p.o. daily.  Patient denies any interruption in therapy.    Activities of Daily Living:  Patient reports morning stiffness for 0 minutes.   Patient Denies nocturnal pain.  Difficulty dressing/grooming: Denies Difficulty climbing stairs: Denies Difficulty getting out of chair: Denies Difficulty using hands for taps, buttons, cutlery, and/or writing: Denies  Review of Systems  Constitutional:  Positive for fatigue.  HENT:  Negative for mouth sores and mouth dryness.   Eyes:  Negative for dryness.  Respiratory:  Negative for shortness of breath.   Cardiovascular:  Negative for chest pain and palpitations.  Gastrointestinal:  Negative for blood in stool, constipation and diarrhea.  Endocrine: Negative for increased urination.  Genitourinary:  Negative for involuntary urination.  Musculoskeletal:  Negative for joint pain, gait problem, joint pain, joint swelling, myalgias, muscle weakness, morning stiffness, muscle tenderness and myalgias.  Skin:  Negative for color change, rash, hair loss and sensitivity to sunlight.  Allergic/Immunologic: Negative for susceptible to infections.  Neurological:  Negative for dizziness  and headaches.  Hematological:  Negative for swollen glands.  Psychiatric/Behavioral:  Negative for depressed mood and sleep disturbance. The patient is not nervous/anxious.     PMFS History:  Patient Active Problem List   Diagnosis Date Noted   History of hypertension 10/16/2016   Essential hypertension 09/11/2016   Rheumatoid arthritis involving multiple sites with positive rheumatoid factor (HCC) 01/23/2016   High risk medication use 01/23/2016   Primary osteoarthritis of both hands 01/23/2016   Primary osteoarthritis of both feet 01/23/2016   Malignant melanoma (HCC) 01/23/2016   Basal cell carcinoma 01/23/2016   Anxiety 01/23/2016   Chest congestion 06/07/2015    Past Medical History:  Diagnosis Date   Arthritis    COVID-19    Melanoma (HCC) rhe   Rheumatoid arthritis(714.0)     Family History  Problem Relation Age of Onset   Cancer Mother        breast   Breast cancer Mother 68   Rheum arthritis Other    Past Surgical History:  Procedure Laterality Date   CESAREAN SECTION     INJECTION KNEE Right    Fluid removed from knee x2    MELANOMA EXCISION     SKIN BIOPSY     back of right leg, right buttocks, right chest, left cheek    Social History   Social History Narrative   Not on file   Immunization History  Administered Date(s) Administered   PFIZER(Purple Top)SARS-COV-2 Vaccination 03/01/2019, 03/22/2019, 11/24/2019     Objective:  Vital Signs: BP (!) 137/90 (BP Location: Left Arm, Patient Position: Sitting, Cuff Size: Normal)   Pulse 65   Resp 14   Ht 5\' 5"  (1.651 m)   Wt 158 lb 12.8 oz (72 kg)   BMI 26.43 kg/m    Physical Exam Vitals and nursing note reviewed.  Constitutional:      Appearance: She is well-developed.  HENT:     Head: Normocephalic and atraumatic.  Eyes:     Conjunctiva/sclera: Conjunctivae normal.  Cardiovascular:     Rate and Rhythm: Normal rate and regular rhythm.     Heart sounds: Normal heart sounds.  Pulmonary:      Effort: Pulmonary effort is normal.     Breath sounds: Normal breath sounds.  Abdominal:     General: Bowel sounds are normal.     Palpations: Abdomen is soft.  Musculoskeletal:     Cervical back: Normal range of motion.  Lymphadenopathy:     Cervical: No cervical adenopathy.  Skin:    General: Skin is warm and dry.     Capillary Refill: Capillary refill takes less than 2 seconds.  Neurological:     Mental Status: She is alert and oriented to person, place, and time.  Psychiatric:        Behavior: Behavior normal.      Musculoskeletal Exam: Cervical, thoracic and lumbar spine 1 good range of motion.  She had good range of motion of bilateral shoulders, elbows, wrist, MCPs PIPs and DIPs.  Left second MCP thickening with no synovitis was noted.  PIP and DIP thickening with no synovitis was noted.  Hip joints and knee joints with good range of motion.  There was no tenderness over ankles or MTPs.  CDAI Exam: CDAI Score: -- Patient Global: 0 / 100; Provider Global: 0 / 100 Swollen: --; Tender: -- Joint Exam 01/07/2023   No joint exam has been documented for this visit   There is currently no information documented on the homunculus. Go to the Rheumatology activity and complete the homunculus joint exam.  Investigation: No additional findings.  Imaging: No results found.  Recent Labs: Lab Results  Component Value Date   WBC 4.7 11/23/2022   HGB 13.4 11/23/2022   PLT 325 11/23/2022   NA 139 11/23/2022   K 4.0 11/23/2022   CL 101 11/23/2022   CO2 29 11/23/2022   GLUCOSE 120 (H) 11/23/2022   BUN 13 11/23/2022   CREATININE 0.73 11/23/2022   BILITOT 0.8 11/23/2022   ALKPHOS 59 10/23/2016   AST 27 11/23/2022   ALT 28 11/23/2022   PROT 6.8 11/23/2022   ALBUMIN 4.2 10/23/2016   CALCIUM 9.6 11/23/2022   GFRAA 112 07/28/2020   QFTBGOLDPLUS NEGATIVE 11/23/2022    Speciality Comments: PLQ Eye exam: 04/13/19 WNL @ [facility] Follow up in 1 year Orencia started  October 2021.  Plaquenil discontinued due to improvement  Procedures:  No procedures performed Allergies: Amoxil [amoxicillin]   Assessment / Plan:     Visit Diagnoses: Rheumatoid arthritis involving multiple sites with positive rheumatoid factor (HCC) Erosive disease - Erosive disease: Patient denies having a flare since the last visit.  She had no synovitis on the examination today.  She continues to take Orencia 125 mg subcu weekly along with leflunomide 20 mg p.o. daily.  She denies any interruption in the treatment.  She has no difficulty taking subcutaneous injections.  She has been under a lot of stress due to family situations but did not  have a flare.  High risk medication use - Orencia 125 mg subcutaneous weekly (started 12/18/2019) and Arava 20 mg by mouth daily.  (Plaquenil discontinued March 2022-no improvement).  Labs obtained in September 2024 were reviewed.  CBC and CMP were normal.  TB Gold was negative.  She was advised to get repeat labs in December and every 3 months.  Information on immunization was placed in the AVS.  She was advised to hold Orencia and methotrexate if she develops an infection and resume after the infection resolves.  Primary osteoarthritis of both hands-she had bilateral PIP and DIP thickening.  No synovitis was noted.  Joint protection muscle strengthening was discussed.  Lateral epicondylitis, right elbow - Resolved.  Primary osteoarthritis of both feet-she is some stiffness in her feet.  She has bilateral pes cavus and hammertoes.  No synovitis was noted.  Pes cavus of both feet-use of  metatarsal arches and arch support was advised.  Osteopenia of multiple sites - DEXA updated on 02/25/20: LFN BMD 0.707 with T-score -1.3.  She is on vitamin D.  She was reminded to get repeat DEXA scan.  History of melanoma  History of basal cell cancer  History of hypertension-blood pressure was elevated at 137/90.  Patient was advised to monitor blood pressure  closely and follow-up with the PCP.  History of squamous cell carcinoma  Anxiety  Orders: No orders of the defined types were placed in this encounter.  No orders of the defined types were placed in this encounter.   Follow-Up Instructions: Return in about 5 months (around 06/07/2023) for Rheumatoid arthritis.   Pollyann Savoy, MD  Note - This record has been created using Animal nutritionist.  Chart creation errors have been sought, but may not always  have been located. Such creation errors do not reflect on  the standard of medical care.

## 2023-01-01 ENCOUNTER — Other Ambulatory Visit: Payer: Self-pay

## 2023-01-01 ENCOUNTER — Other Ambulatory Visit: Payer: Self-pay | Admitting: Physician Assistant

## 2023-01-01 ENCOUNTER — Other Ambulatory Visit (HOSPITAL_COMMUNITY): Payer: Self-pay

## 2023-01-01 DIAGNOSIS — M0579 Rheumatoid arthritis with rheumatoid factor of multiple sites without organ or systems involvement: Secondary | ICD-10-CM

## 2023-01-01 MED ORDER — ORENCIA CLICKJECT 125 MG/ML ~~LOC~~ SOAJ
125.0000 mg | SUBCUTANEOUS | 0 refills | Status: DC
  Filled 2023-01-01: qty 4, 28d supply, fill #0
  Filled 2023-01-28: qty 4, 28d supply, fill #1
  Filled 2023-03-11: qty 4, 28d supply, fill #2

## 2023-01-01 NOTE — Progress Notes (Signed)
Specialty Pharmacy Refill Coordination Note  Kara Campbell is a 62 y.o. female contacted today regarding refills of specialty medication(s) Abatacept   Patient requested [name] at [facility] at [place] [address] date: 01/09/23   Medication will be filled on 01/08/23.

## 2023-01-01 NOTE — Progress Notes (Signed)
Specialty Pharmacy Ongoing Clinical Assessment Note  Kara Campbell is a 62 y.o. female who is being followed by the specialty pharmacy service for RxSp Rheumatoid Arthritis   Patient's specialty medication(s) reviewed today: Abatacept   Missed doses in the last 4 weeks: 0   Patient/Caregiver did not have any additional questions or concerns.   Therapeutic benefit summary: Patient is achieving benefit   Adverse events/side effects summary: No adverse events/side effects   Patient's therapy is appropriate to: Continue    Goals Addressed             This Visit's Progress    Reduce signs and symptoms       Patient is on track. Patient will maintain adherence         Follow up:  6 months  Otto Herb Specialty Pharmacist

## 2023-01-01 NOTE — Telephone Encounter (Signed)
Last Fill: 10/12/2022  Labs: 11/23/2022 glucose is 120. Rest of CMP WNL.  CBC WNL   TB Gold: 11/23/2022 Neg    Next Visit: 01/07/2023  Last Visit: 06/27/2022  IO:[id number] arthritis involving multiple sites with positive rheumatoid factor   Current Dose per office note 06/27/2022: Orencia 125 mg subcutaneous weekly   Okay to refill Orencia?

## 2023-01-03 ENCOUNTER — Other Ambulatory Visit (HOSPITAL_COMMUNITY): Payer: Self-pay

## 2023-01-07 ENCOUNTER — Encounter: Payer: Self-pay | Admitting: Rheumatology

## 2023-01-07 ENCOUNTER — Ambulatory Visit: Payer: Commercial Managed Care - PPO | Attending: Rheumatology | Admitting: Rheumatology

## 2023-01-07 VITALS — BP 137/90 | HR 65 | Resp 14 | Ht 65.0 in | Wt 158.8 lb

## 2023-01-07 DIAGNOSIS — M19071 Primary osteoarthritis, right ankle and foot: Secondary | ICD-10-CM | POA: Diagnosis not present

## 2023-01-07 DIAGNOSIS — F419 Anxiety disorder, unspecified: Secondary | ICD-10-CM

## 2023-01-07 DIAGNOSIS — Z85820 Personal history of malignant melanoma of skin: Secondary | ICD-10-CM

## 2023-01-07 DIAGNOSIS — Q6671 Congenital pes cavus, right foot: Secondary | ICD-10-CM | POA: Diagnosis not present

## 2023-01-07 DIAGNOSIS — M19072 Primary osteoarthritis, left ankle and foot: Secondary | ICD-10-CM

## 2023-01-07 DIAGNOSIS — Z8589 Personal history of malignant neoplasm of other organs and systems: Secondary | ICD-10-CM | POA: Diagnosis not present

## 2023-01-07 DIAGNOSIS — Z85828 Personal history of other malignant neoplasm of skin: Secondary | ICD-10-CM | POA: Diagnosis not present

## 2023-01-07 DIAGNOSIS — Z8679 Personal history of other diseases of the circulatory system: Secondary | ICD-10-CM

## 2023-01-07 DIAGNOSIS — M19041 Primary osteoarthritis, right hand: Secondary | ICD-10-CM | POA: Diagnosis not present

## 2023-01-07 DIAGNOSIS — Q6672 Congenital pes cavus, left foot: Secondary | ICD-10-CM

## 2023-01-07 DIAGNOSIS — Z79899 Other long term (current) drug therapy: Secondary | ICD-10-CM | POA: Diagnosis not present

## 2023-01-07 DIAGNOSIS — M8589 Other specified disorders of bone density and structure, multiple sites: Secondary | ICD-10-CM | POA: Diagnosis not present

## 2023-01-07 DIAGNOSIS — M7711 Lateral epicondylitis, right elbow: Secondary | ICD-10-CM

## 2023-01-07 DIAGNOSIS — M0579 Rheumatoid arthritis with rheumatoid factor of multiple sites without organ or systems involvement: Secondary | ICD-10-CM | POA: Diagnosis not present

## 2023-01-07 DIAGNOSIS — M19042 Primary osteoarthritis, left hand: Secondary | ICD-10-CM

## 2023-01-07 NOTE — Patient Instructions (Signed)
Standing Labs We placed an order today for your standing lab work.   Please have your standing labs drawn in December and every 3 months  Please have your labs drawn 2 weeks prior to your appointment so that the provider can discuss your lab results at your appointment, if possible.  Please note that you may see your imaging and lab results in MyChart before we have reviewed them. We will contact you once all results are reviewed. Please allow our office up to 72 hours to thoroughly review all of the results before contacting the office for clarification of your results.  WALK-IN LAB HOURS  Monday through Thursday from 8:00 am -12:30 pm and 1:00 pm-5:00 pm and Friday from 8:00 am-12:00 pm.  Patients with office visits requiring labs will be seen before walk-in labs.  You may encounter longer than normal wait times. Please allow additional time. Wait times may be shorter on  Monday and Thursday afternoons.  We do not book appointments for walk-in labs. We appreciate your patience and understanding with our staff.   Labs are drawn by [facility]. Please bring your co-pay at the time of your lab draw.  You may receive a bill from [facility] for your lab work.  Please note if you are on Hydroxychloroquine and and an order has been placed for a Hydroxychloroquine level,  you will need to have it drawn 4 hours or more after your last dose.  If you wish to have your labs drawn at another location, please call the office 24 hours in advance so we can fax the orders.  The office is located at [address], [address], [place], [place] [address]   If you have any questions regarding directions or hours of operation,  please call [phone].   As a reminder, please drink plenty of water prior to coming for your lab work. Thanks!   Vaccines You are taking a medication(s) that can suppress your immune system.  The following immunizations are recommended: Flu annually Covid-19  RSV Td/Tdap (tetanus,  diphtheria, pertussis) every 10 years Pneumonia (Prevnar 15 then Pneumovax 23 at least 1 year apart.  Alternatively, can take Prevnar 20 without needing additional dose) Shingrix: 2 doses from 4 weeks to 6 months apart  Please check with your PCP to make sure you are up to date.  If you have signs or symptoms of an infection or start antibiotics: First, call your PCP for workup of your infection. Hold your medication through the infection, until you complete your antibiotics, and until symptoms resolve if you take the following: Injectable medication (Actemra, Benlysta, Cimzia, Cosentyx, Enbrel, Humira, Kevzara, Orencia, Remicade, Simponi, Stelara, Taltz, Tremfya) Methotrexate Leflunomide (Arava) Mycophenolate (Cellcept) Harriette Ohara, Olumiant, or Rinvoq

## 2023-01-08 ENCOUNTER — Other Ambulatory Visit: Payer: Self-pay

## 2023-01-28 ENCOUNTER — Other Ambulatory Visit (HOSPITAL_COMMUNITY): Payer: Self-pay

## 2023-01-28 NOTE — Progress Notes (Signed)
Specialty Pharmacy Refill Coordination Note  Kara Campbell is a 62 y.o. female contacted today regarding refills of specialty medication(s) Abatacept   Patient requested Kara Campbell at [facility] at [place] date: 02/08/23   Medication will be filled on 02/06/23.

## 2023-01-31 ENCOUNTER — Other Ambulatory Visit (HOSPITAL_COMMUNITY): Payer: Self-pay

## 2023-02-06 ENCOUNTER — Other Ambulatory Visit: Payer: Self-pay

## 2023-02-08 ENCOUNTER — Other Ambulatory Visit (HOSPITAL_COMMUNITY): Payer: Self-pay

## 2023-02-26 ENCOUNTER — Other Ambulatory Visit: Payer: Self-pay

## 2023-02-26 ENCOUNTER — Other Ambulatory Visit: Payer: Self-pay | Admitting: Rheumatology

## 2023-02-26 ENCOUNTER — Other Ambulatory Visit (HOSPITAL_COMMUNITY): Payer: Self-pay

## 2023-02-26 MED ORDER — LEFLUNOMIDE 20 MG PO TABS
20.0000 mg | ORAL_TABLET | Freq: Every day | ORAL | 2 refills | Status: DC
  Filled 2023-02-26: qty 30, 30d supply, fill #0
  Filled 2023-04-03: qty 30, 30d supply, fill #1
  Filled 2023-05-04: qty 30, 30d supply, fill #2

## 2023-02-26 NOTE — Telephone Encounter (Signed)
Last Fill: 11/26/2022  Labs: 11/23/2022 glucose is 120. Rest of CMP WNL.  CBC WNL   Next Visit: 06/12/2023  Last Visit: 01/07/2023  DX: Rheumatoid arthritis involving multiple sites with positive rheumatoid factor   Current Dose per office note 01/07/2023: Arava 20 mg by mouth daily   Patient advised she is due to update her lab work. Patient states she will do it within the next day or two.   Okay to refill Arava ?

## 2023-02-27 ENCOUNTER — Other Ambulatory Visit: Payer: Self-pay

## 2023-02-27 ENCOUNTER — Other Ambulatory Visit: Payer: Self-pay | Admitting: *Deleted

## 2023-02-27 DIAGNOSIS — Z79899 Other long term (current) drug therapy: Secondary | ICD-10-CM | POA: Diagnosis not present

## 2023-02-28 ENCOUNTER — Other Ambulatory Visit (HOSPITAL_COMMUNITY): Payer: Self-pay

## 2023-02-28 LAB — COMPLETE METABOLIC PANEL WITH GFR
AG Ratio: 1.8 (calc) (ref 1.0–2.5)
ALT: 22 U/L (ref 6–29)
AST: 22 U/L (ref 10–35)
Albumin: 4.1 g/dL (ref 3.6–5.1)
Alkaline phosphatase (APISO): 57 U/L (ref 37–153)
BUN: 12 mg/dL (ref 7–25)
CO2: 28 mmol/L (ref 20–32)
Calcium: 9.4 mg/dL (ref 8.6–10.4)
Chloride: 103 mmol/L (ref 98–110)
Creat: 0.65 mg/dL (ref 0.50–1.05)
Globulin: 2.3 g/dL (ref 1.9–3.7)
Glucose, Bld: 96 mg/dL (ref 65–99)
Potassium: 4.3 mmol/L (ref 3.5–5.3)
Sodium: 140 mmol/L (ref 135–146)
Total Bilirubin: 0.5 mg/dL (ref 0.2–1.2)
Total Protein: 6.4 g/dL (ref 6.1–8.1)
eGFR: 99 mL/min/{1.73_m2} (ref >=60)

## 2023-02-28 LAB — CBC WITH DIFFERENTIAL/PLATELET
Absolute Lymphocytes: 1184 {cells}/uL (ref 850–3900)
Absolute Monocytes: 558 {cells}/uL (ref 200–950)
Basophils Absolute: 68 {cells}/uL (ref 0–200)
Basophils Relative: 1.5 %
Eosinophils Absolute: 122 {cells}/uL (ref 15–500)
Eosinophils Relative: 2.7 %
HCT: 38.7 % (ref 35.0–45.0)
Hemoglobin: 12.8 g/dL (ref 11.7–15.5)
MCH: 30.8 pg (ref 27.0–33.0)
MCHC: 33.1 g/dL (ref 32.0–36.0)
MCV: 93 fL (ref 80.0–100.0)
MPV: 10.3 fL (ref 7.5–12.5)
Monocytes Relative: 12.4 %
Neutro Abs: 2570 {cells}/uL (ref 1500–7800)
Neutrophils Relative %: 57.1 %
Platelets: 301 10*3/uL (ref 140–400)
RBC: 4.16 10*6/uL (ref 3.80–5.10)
RDW: 11.8 % (ref 11.0–15.0)
Total Lymphocyte: 26.3 %
WBC: 4.5 10*3/uL (ref 3.8–10.8)

## 2023-02-28 NOTE — Progress Notes (Signed)
CBC and CMP are normal.

## 2023-03-01 ENCOUNTER — Other Ambulatory Visit (HOSPITAL_COMMUNITY): Payer: Self-pay

## 2023-03-11 ENCOUNTER — Other Ambulatory Visit: Payer: Self-pay

## 2023-03-11 NOTE — Progress Notes (Signed)
Specialty Pharmacy Refill Coordination Note  Kara Campbell is a 62 y.o. female contacted today regarding refills of specialty medication(s) Abatacept (Orencia ClickJect)   Patient requested [name] at [facility] at [place] date: 03/12/23   Medication will be filled on 12.30.24.

## 2023-03-16 ENCOUNTER — Other Ambulatory Visit (HOSPITAL_COMMUNITY): Payer: Self-pay

## 2023-03-28 ENCOUNTER — Other Ambulatory Visit: Payer: Self-pay

## 2023-04-03 ENCOUNTER — Other Ambulatory Visit (HOSPITAL_COMMUNITY): Payer: Self-pay | Admitting: Pharmacy Technician

## 2023-04-03 ENCOUNTER — Other Ambulatory Visit (HOSPITAL_COMMUNITY): Payer: Self-pay

## 2023-04-03 NOTE — Progress Notes (Signed)
Open encounter by accident. LVM for patient.

## 2023-04-04 ENCOUNTER — Other Ambulatory Visit: Payer: Self-pay

## 2023-04-04 ENCOUNTER — Other Ambulatory Visit: Payer: Self-pay | Admitting: Physician Assistant

## 2023-04-04 DIAGNOSIS — M0579 Rheumatoid arthritis with rheumatoid factor of multiple sites without organ or systems involvement: Secondary | ICD-10-CM

## 2023-04-04 MED ORDER — ORENCIA CLICKJECT 125 MG/ML ~~LOC~~ SOAJ
125.0000 mg | SUBCUTANEOUS | 0 refills | Status: DC
Start: 2023-04-04 — End: 2023-06-28
  Filled 2023-04-04: qty 4, 28d supply, fill #0
  Filled 2023-05-01: qty 4, 28d supply, fill #1
  Filled 2023-06-03: qty 4, 28d supply, fill #2

## 2023-04-04 NOTE — Progress Notes (Signed)
Specialty Pharmacy Refill Coordination Note  Kara Campbell is a 63 y.o. female contacted today regarding refills of specialty medication(s) Abatacept (Orencia ClickJect)   Patient requested [name] at [facility] at [place] date: 04/09/23   Medication will be filled on 04/08/23.   Pending refill request

## 2023-04-04 NOTE — Telephone Encounter (Signed)
Last Fill: 01/01/2023  Labs: 02/27/2023 CBC and CMP are normal.   TB Gold: 11/23/2022   Next Visit: 06/12/2023  Last Visit: 01/07/2023  QM:[id number] arthritis involving multiple sites with positive rheumatoid factor   Current Dose per office note 01/07/2023: Orencia 125 mg subcutaneous weekly   Okay to refill Orencia?

## 2023-04-05 ENCOUNTER — Other Ambulatory Visit (HOSPITAL_COMMUNITY): Payer: Self-pay

## 2023-04-08 ENCOUNTER — Other Ambulatory Visit: Payer: Self-pay

## 2023-04-10 ENCOUNTER — Other Ambulatory Visit (HOSPITAL_COMMUNITY): Payer: Self-pay

## 2023-04-16 DIAGNOSIS — S92514A Nondisplaced fracture of proximal phalanx of right lesser toe(s), initial encounter for closed fracture: Secondary | ICD-10-CM | POA: Diagnosis not present

## 2023-05-01 ENCOUNTER — Other Ambulatory Visit: Payer: Self-pay

## 2023-05-01 ENCOUNTER — Other Ambulatory Visit (HOSPITAL_COMMUNITY): Payer: Self-pay

## 2023-05-01 NOTE — Progress Notes (Signed)
 Specialty Pharmacy Refill Coordination Note  Kara Campbell is a 63 y.o. female contacted today regarding refills of specialty medication(s) Abatacept (Orencia ClickJect)   Patient requested [name] at [facility] at [place] date: 05/07/23   Medication will be filled on 05/06/23.

## 2023-05-02 DIAGNOSIS — Z13820 Encounter for screening for osteoporosis: Secondary | ICD-10-CM | POA: Diagnosis not present

## 2023-05-02 DIAGNOSIS — Z01419 Encounter for gynecological examination (general) (routine) without abnormal findings: Secondary | ICD-10-CM | POA: Diagnosis not present

## 2023-05-02 DIAGNOSIS — Z6826 Body mass index (BMI) 26.0-26.9, adult: Secondary | ICD-10-CM | POA: Diagnosis not present

## 2023-05-02 DIAGNOSIS — Z1231 Encounter for screening mammogram for malignant neoplasm of breast: Secondary | ICD-10-CM | POA: Diagnosis not present

## 2023-05-02 DIAGNOSIS — Z124 Encounter for screening for malignant neoplasm of cervix: Secondary | ICD-10-CM | POA: Diagnosis not present

## 2023-05-04 ENCOUNTER — Other Ambulatory Visit (HOSPITAL_COMMUNITY): Payer: Self-pay

## 2023-05-06 ENCOUNTER — Other Ambulatory Visit: Payer: Self-pay

## 2023-05-07 ENCOUNTER — Other Ambulatory Visit (HOSPITAL_COMMUNITY): Payer: Self-pay

## 2023-05-22 ENCOUNTER — Other Ambulatory Visit: Payer: Self-pay

## 2023-05-29 NOTE — Progress Notes (Unsigned)
 Office Visit Note  Patient: Kara Campbell             Date of Birth: 1960/04/20           MRN: [id number]             PCP: Lewis Moccasin, MD Referring: Lewis Moccasin, MD Visit Date: 06/12/2023 Occupation: @GUAROCC @  Subjective:  Medication monitoring   History of Present Illness: Kara Campbell is a 63 y.o. female with history of seropositive rheumatoid arthritis and osteoarthritis.  Patient remains on  Orencia 125 mg subcutaneous weekly (started 12/18/2019) and Arava 20 mg by mouth daily.  She is tolerating combination therapy without any side effects and has not had any recent gaps in therapy.  She denies any signs or symptoms of a rheumatoid arthritis flare.  Patient states she has had some increased discomfort in the left [facility] joint which she attributes to holding her Kindle while reading.  She has intermittent discomfort in her right second MCP joint but denies any increased inflammation recently.  Overall her symptoms remain stable on the current treatment regimen.  She denies any recent or recurrent infections. Patient reports that she had an updated bone density ordered by her gynecologist.  According to the patient she remains within the osteopenic range.  She increased her vitamin D to 4000 units daily.      Activities of Daily Living:  Patient reports morning stiffness for 0 minutes.   Patient Denies nocturnal pain.  Difficulty dressing/grooming: Denies Difficulty climbing stairs: Denies Difficulty getting out of chair: Denies Difficulty using hands for taps, buttons, cutlery, and/or writing: Denies  Review of Systems  Constitutional:  Negative for fatigue.  HENT:  Negative for mouth sores, mouth dryness and nose dryness.   Eyes:  Negative for pain and dryness.  Respiratory:  Negative for shortness of breath and difficulty breathing.   Cardiovascular:  Negative for chest pain and palpitations.  Gastrointestinal:  Negative for blood in stool, constipation and  diarrhea.  Endocrine: Negative for increased urination.  Genitourinary:  Negative for involuntary urination.  Musculoskeletal:  Positive for joint pain, joint pain and joint swelling. Negative for gait problem, myalgias, muscle weakness, morning stiffness, muscle tenderness and myalgias.  Skin:  Negative for color change, rash, hair loss and sensitivity to sunlight.  Allergic/Immunologic: Negative for susceptible to infections.  Neurological:  Negative for dizziness and headaches.  Hematological:  Negative for swollen glands.  Psychiatric/Behavioral:  Negative for depressed mood and sleep disturbance. The patient is not nervous/anxious.     PMFS History:  Patient Active Problem List   Diagnosis Date Noted   History of hypertension 10/16/2016   Essential hypertension 09/11/2016   Rheumatoid arthritis involving multiple sites with positive rheumatoid factor (HCC) 01/23/2016   High risk medication use 01/23/2016   Primary osteoarthritis of both hands 01/23/2016   Primary osteoarthritis of both feet 01/23/2016   Malignant melanoma (HCC) 01/23/2016   Basal cell carcinoma 01/23/2016   Anxiety 01/23/2016   Chest congestion 06/07/2015    Past Medical History:  Diagnosis Date   Arthritis    COVID-19    Melanoma (HCC) rhe   Rheumatoid arthritis(714.0)     Family History  Problem Relation Age of Onset   Cancer Mother        breast   Breast cancer Mother 30   Rheum arthritis Other    Past Surgical History:  Procedure Laterality Date   CESAREAN SECTION     INJECTION KNEE Right  Fluid removed from knee x2    MELANOMA EXCISION     SKIN BIOPSY     back of right leg, right buttocks, right chest, left cheek    Social History   Social History Narrative   Not on file   Immunization History  Administered Date(s) Administered   PFIZER(Purple Top)SARS-COV-2 Vaccination 03/01/2019, 03/22/2019, 11/24/2019     Objective: Vital Signs: BP 116/80 (BP Location: Left Arm, Patient  Position: Sitting, Cuff Size: Normal)   Pulse 71   Resp 14   Ht 5' 4.5" (1.638 m)   Wt 158 lb 3.2 oz (71.8 kg)   BMI 26.74 kg/m    Physical Exam Vitals and nursing note reviewed.  Constitutional:      Appearance: She is well-developed.  HENT:     Head: Normocephalic and atraumatic.  Eyes:     Conjunctiva/sclera: Conjunctivae normal.  Cardiovascular:     Rate and Rhythm: Normal rate and regular rhythm.     Heart sounds: Normal heart sounds.  Pulmonary:     Effort: Pulmonary effort is normal.     Breath sounds: Normal breath sounds.  Abdominal:     General: Bowel sounds are normal.     Palpations: Abdomen is soft.  Musculoskeletal:     Cervical back: Normal range of motion.  Lymphadenopathy:     Cervical: No cervical adenopathy.  Skin:    General: Skin is warm and dry.     Capillary Refill: Capillary refill takes less than 2 seconds.  Neurological:     Mental Status: She is alert and oriented to person, place, and time.  Psychiatric:        Behavior: Behavior normal.      Musculoskeletal Exam: C-spine, thoracic spine, and lumbar spine have good ROM.  Shoulder joints, elbow joints, wrist joints have good range of motion.  Some prominence over both CMC joints.  Synovial thickening of bilateral second MCP joints.  PIP and DIP thickening consistent with osteoarthritis of both hands.  Hip joints have good range of motion with no groin pain.  Knee joints have good range of motion no warmth or effusion.  Ankle joints have good range of motion with no tenderness or joint swelling.  Pes cavus noted bilaterally.  Hammertoes noted.  CDAI Exam: CDAI Score: -- Patient Global: --; Provider Global: -- Swollen: --; Tender: -- Joint Exam 06/12/2023   No joint exam has been documented for this visit   There is currently no information documented on the homunculus. Go to the Rheumatology activity and complete the homunculus joint exam.  Investigation: No additional  findings.  Imaging: No results found.  Recent Labs: Lab Results  Component Value Date   WBC 4.5 02/27/2023   HGB 12.8 02/27/2023   PLT 301 02/27/2023   NA 140 02/27/2023   K 4.3 02/27/2023   CL 103 02/27/2023   CO2 28 02/27/2023   GLUCOSE 96 02/27/2023   BUN 12 02/27/2023   CREATININE 0.65 02/27/2023   BILITOT 0.5 02/27/2023   ALKPHOS 59 10/23/2016   AST 22 02/27/2023   ALT 22 02/27/2023   PROT 6.4 02/27/2023   ALBUMIN 4.2 10/23/2016   CALCIUM 9.4 02/27/2023   GFRAA 112 07/28/2020   QFTBGOLDPLUS NEGATIVE 11/23/2022    Speciality Comments: PLQ Eye exam: 04/13/19 WNL @ [facility] Follow up in 1 year Orencia started October 2021.  Plaquenil discontinued due to improvement  Procedures:  No procedures performed Allergies: Amoxil [amoxicillin]   Assessment / Plan:  Visit Diagnoses: Rheumatoid arthritis involving multiple sites with positive rheumatoid factor (HCC) Erosive disease - No synovitis was noted on examination today.  She has not had any signs or symptoms of a rheumatoid arthritis flare.  She has clinically been doing well on Orencia 125 mg subcutaneous injections once weekly and Arava 20 mg 1 tablet by mouth daily.  She is tolerating combination therapy without any side effects and has not had any recent gaps in therapy.  She experiences intermittent discomfort in her right second MCP joint and the left CMC joint typically after repetitive or overuse activities.  No active inflammation was noted on examination today.  No medication changes will be made at this time.  She was advised to notify us if she develops signs or symptoms of a flare.  She will follow-up in the office in 5-6 months or sooner if needed.  Plan: Lipid panel  High risk medication use - Orencia 125 mg subcutaneous injections once weekly (started 12/18/2019) and Arava 20 mg by mouth daily.   (Plaquenil discontinued March 2022-no improvement).  CBC and CMP WNL on 02/27/23. Orders for CBC and CMP  released today.  Her next lab work will be due in July and every 3 months to monitor for drug toxicity. TB gold negative on 11/23/22. Lipid panel updated today.  No recent or recurrent infections. Discussed the importance of holding orencia and arava if she develops signs or symptoms of an infection and to resume once the infection has completely cleared.  - Plan: CBC with Differential/Platelet, Comprehensive metabolic panel with GFR, CBC with Differential/Platelet, Comprehensive metabolic panel with GFR, Lipid panel  Screening for lipid disorders - Order for lipid panel released today. Plan: Lipid panel  Primary osteoarthritis of both hands: Patient has been experiencing intermittent discomfort in the left [facility] joint which she attributes to holding her Kindle while reading.  She had no tenderness on examination today.  No active inflammation noted.  Primary osteoarthritis of both feet: She has good range of motion of both ankle joints with no tenderness or synovitis.  Pes cavus noted bilaterally.  Hammertoes noted.  No tenderness or synovitis over MTP joints.  Pes cavus of both feet: Discussed the importance of wearing proper fitting shoes.  Osteopenia of multiple sites - DEXA updated on 02/25/20: LFN BMD 0.707 with T-score -1.3.  She has had an updated DEXA ordered by gynecology.  According to the patient she was told that she remains within the osteopenic range.  She has increased vitamin D to 4000 units daily.  Plan to check vitamin D level today.  We will call to try to obtain DEXA records.  Vitamin D deficiency - Order for vitamin D released today. Plan: VITAMIN D 25 Hydroxy (Vit-D Deficiency, Fractures)  Other medical conditions are listed as follows:  History of squamous cell carcinoma: Under care of dermatology  History of melanoma: Under care of dermatology.  History of basal cell cancer: Under care of [facility].  History of hypertension: Blood pressure was 116/80 today  in the office.  Anxiety     Orders: Orders Placed This Encounter  Procedures   CBC with Differential/Platelet   Comprehensive metabolic panel with GFR   CBC with Differential/Platelet   Comprehensive metabolic panel with GFR   Lipid panel   VITAMIN D 25 Hydroxy (Vit-D Deficiency, Fractures)   No orders of the defined types were placed in this encounter.     Follow-Up Instructions: Return in about 6 months (around 12/12/2023) for Rheumatoid  arthritis, Osteoarthritis.   Gearldine Bienenstock, PA-C  Note - This record has been created using Dragon software.  Chart creation errors have been sought, but may not always  have been located. Such creation errors do not reflect on  the standard of medical care.

## 2023-05-30 ENCOUNTER — Other Ambulatory Visit (HOSPITAL_COMMUNITY): Payer: Self-pay

## 2023-05-30 ENCOUNTER — Other Ambulatory Visit: Payer: Self-pay

## 2023-05-30 ENCOUNTER — Other Ambulatory Visit: Payer: Self-pay | Admitting: Rheumatology

## 2023-05-30 MED ORDER — LEFLUNOMIDE 20 MG PO TABS
20.0000 mg | ORAL_TABLET | Freq: Every day | ORAL | 0 refills | Status: DC
  Filled 2023-05-30: qty 30, 30d supply, fill #0

## 2023-05-30 NOTE — Telephone Encounter (Signed)
 Last Fill: 02/26/2023  Labs: 02/27/2023 CBC and CMP are normal.   Next Visit: 06/12/2023  Last Visit: 01/07/2023  DX:  Rheumatoid arthritis involving multiple sites with positive rheumatoid factor (HCC) Erosive disease   Current Dose per office note 01/07/2023: Arava 20 mg by mouth daily.   Patient to update labs at upcoming appointment.   Okay to refill Arava ?

## 2023-06-01 ENCOUNTER — Other Ambulatory Visit (HOSPITAL_COMMUNITY): Payer: Self-pay

## 2023-06-03 ENCOUNTER — Other Ambulatory Visit: Payer: Self-pay

## 2023-06-03 ENCOUNTER — Other Ambulatory Visit (HOSPITAL_COMMUNITY): Payer: Self-pay

## 2023-06-03 NOTE — Progress Notes (Signed)
 Specialty Pharmacy Refill Coordination Note  Kara Campbell is a 63 y.o. female contacted today regarding refills of specialty medication(s) Abatacept (Orencia ClickJect)   Patient requested [name] at [facility] at [place] date: 06/06/23   Medication will be filled on 03.26.25.

## 2023-06-05 ENCOUNTER — Telehealth: Payer: Self-pay

## 2023-06-05 ENCOUNTER — Other Ambulatory Visit: Payer: Self-pay

## 2023-06-05 NOTE — Telephone Encounter (Signed)
 Received notification from [facility] pharmacy that patient requires a new authorization for their medication.  Submitted an URGENT Prior Authorization request to [facility] for ORENCIA SQ via CoverMyMeds. Will update once we receive a response.  Key: [id number]

## 2023-06-06 ENCOUNTER — Other Ambulatory Visit: Payer: Self-pay

## 2023-06-06 NOTE — Telephone Encounter (Signed)
 Received notification from [facility] regarding a prior authorization for ORENCIA SQ. Authorization has been APPROVED from 06/05/23 to 06/03/24. Approval letter sent to scan center.  Patient must continue to fill through [facility]: [phone]   Authorization # [id number]  Chesley Mires, PharmD, MPH, BCPS, CPP Clinical Pharmacist (Rheumatology and Pulmonology)

## 2023-06-10 ENCOUNTER — Other Ambulatory Visit (HOSPITAL_COMMUNITY): Payer: Self-pay

## 2023-06-10 DIAGNOSIS — L814 Other melanin hyperpigmentation: Secondary | ICD-10-CM | POA: Diagnosis not present

## 2023-06-10 DIAGNOSIS — D225 Melanocytic nevi of trunk: Secondary | ICD-10-CM | POA: Diagnosis not present

## 2023-06-10 DIAGNOSIS — D2272 Melanocytic nevi of left lower limb, including hip: Secondary | ICD-10-CM | POA: Diagnosis not present

## 2023-06-10 DIAGNOSIS — Z85828 Personal history of other malignant neoplasm of skin: Secondary | ICD-10-CM | POA: Diagnosis not present

## 2023-06-10 DIAGNOSIS — D2271 Melanocytic nevi of right lower limb, including hip: Secondary | ICD-10-CM | POA: Diagnosis not present

## 2023-06-10 DIAGNOSIS — Z85820 Personal history of malignant melanoma of skin: Secondary | ICD-10-CM | POA: Diagnosis not present

## 2023-06-10 DIAGNOSIS — L720 Epidermal cyst: Secondary | ICD-10-CM | POA: Diagnosis not present

## 2023-06-10 DIAGNOSIS — L821 Other seborrheic keratosis: Secondary | ICD-10-CM | POA: Diagnosis not present

## 2023-06-10 DIAGNOSIS — L570 Actinic keratosis: Secondary | ICD-10-CM | POA: Diagnosis not present

## 2023-06-10 MED ORDER — FLUOROURACIL 5 % EX CREA
TOPICAL_CREAM | CUTANEOUS | 0 refills | Status: AC
  Filled 2023-06-10: qty 40, 14d supply, fill #0

## 2023-06-11 ENCOUNTER — Other Ambulatory Visit (HOSPITAL_COMMUNITY): Payer: Self-pay

## 2023-06-12 ENCOUNTER — Ambulatory Visit: Payer: Commercial Managed Care - PPO | Attending: Physician Assistant | Admitting: Physician Assistant

## 2023-06-12 ENCOUNTER — Encounter: Payer: Self-pay | Admitting: Physician Assistant

## 2023-06-12 VITALS — BP 116/80 | HR 71 | Resp 14 | Ht 64.5 in | Wt 158.2 lb

## 2023-06-12 DIAGNOSIS — Z8679 Personal history of other diseases of the circulatory system: Secondary | ICD-10-CM | POA: Diagnosis not present

## 2023-06-12 DIAGNOSIS — E559 Vitamin D deficiency, unspecified: Secondary | ICD-10-CM | POA: Diagnosis not present

## 2023-06-12 DIAGNOSIS — Z85820 Personal history of malignant melanoma of skin: Secondary | ICD-10-CM

## 2023-06-12 DIAGNOSIS — M0579 Rheumatoid arthritis with rheumatoid factor of multiple sites without organ or systems involvement: Secondary | ICD-10-CM

## 2023-06-12 DIAGNOSIS — Z79899 Other long term (current) drug therapy: Secondary | ICD-10-CM | POA: Diagnosis not present

## 2023-06-12 DIAGNOSIS — M19071 Primary osteoarthritis, right ankle and foot: Secondary | ICD-10-CM | POA: Diagnosis not present

## 2023-06-12 DIAGNOSIS — Z85828 Personal history of other malignant neoplasm of skin: Secondary | ICD-10-CM | POA: Diagnosis not present

## 2023-06-12 DIAGNOSIS — M19072 Primary osteoarthritis, left ankle and foot: Secondary | ICD-10-CM

## 2023-06-12 DIAGNOSIS — Z8589 Personal history of malignant neoplasm of other organs and systems: Secondary | ICD-10-CM

## 2023-06-12 DIAGNOSIS — M19041 Primary osteoarthritis, right hand: Secondary | ICD-10-CM | POA: Diagnosis not present

## 2023-06-12 DIAGNOSIS — Z13220 Encounter for screening for lipoid disorders: Secondary | ICD-10-CM | POA: Diagnosis not present

## 2023-06-12 DIAGNOSIS — Q6671 Congenital pes cavus, right foot: Secondary | ICD-10-CM

## 2023-06-12 DIAGNOSIS — F419 Anxiety disorder, unspecified: Secondary | ICD-10-CM

## 2023-06-12 DIAGNOSIS — M8589 Other specified disorders of bone density and structure, multiple sites: Secondary | ICD-10-CM | POA: Diagnosis not present

## 2023-06-12 DIAGNOSIS — Q6672 Congenital pes cavus, left foot: Secondary | ICD-10-CM

## 2023-06-12 DIAGNOSIS — M19042 Primary osteoarthritis, left hand: Secondary | ICD-10-CM

## 2023-06-12 NOTE — Patient Instructions (Signed)
Standing Labs We placed an order today for your standing lab work.   Please have your standing labs drawn in July and every 3 months   Please have your labs drawn 2 weeks prior to your appointment so that the provider can discuss your lab results at your appointment, if possible.  Please note that you may see your imaging and lab results in MyChart before we have reviewed them. We will contact you once all results are reviewed. Please allow our office up to 72 hours to thoroughly review all of the results before contacting the office for clarification of your results.  WALK-IN LAB HOURS  Monday through Thursday from 8:00 am -12:30 pm and 1:00 pm-5:00 pm and Friday from 8:00 am-12:00 pm.  Patients with office visits requiring labs will be seen before walk-in labs.  You may encounter longer than normal wait times. Please allow additional time. Wait times may be shorter on  Monday and Thursday afternoons.  We do not book appointments for walk-in labs. We appreciate your patience and understanding with our staff.   Labs are drawn by [facility]. Please bring your co-pay at the time of your lab draw.  You may receive a bill from [facility] for your lab work.  Please note if you are on Hydroxychloroquine and and an order has been placed for a Hydroxychloroquine level,  you will need to have it drawn 4 hours or more after your last dose.  If you wish to have your labs drawn at another location, please call the office 24 hours in advance so we can fax the orders.  The office is located at [address], [address], [place],  [address]   If you have any questions regarding directions or hours of operation,  please call [phone].   As a reminder, please drink plenty of water prior to coming for your lab work. Thanks!  

## 2023-06-12 NOTE — Progress Notes (Signed)
 CBC WNL

## 2023-06-13 LAB — LIPID PANEL
Cholesterol: 186 mg/dL (ref <200)
HDL: 88 mg/dL (ref >=50)
LDL Cholesterol (Calc): 80 mg/dL
Non-HDL Cholesterol (Calc): 98 mg/dL (ref <130)
Total CHOL/HDL Ratio: 2.1 (calc) (ref <5.0)
Triglycerides: 92 mg/dL (ref <150)

## 2023-06-13 LAB — CBC WITH DIFFERENTIAL/PLATELET
Absolute Lymphocytes: 1020 {cells}/uL (ref 850–3900)
Absolute Monocytes: 639 {cells}/uL (ref 200–950)
Basophils Absolute: 52 {cells}/uL (ref 0–200)
Basophils Relative: 1.1 %
Eosinophils Absolute: 89 {cells}/uL (ref 15–500)
Eosinophils Relative: 1.9 %
HCT: 41.1 % (ref 35.0–45.0)
Hemoglobin: 13.6 g/dL (ref 11.7–15.5)
MCH: 30.4 pg (ref 27.0–33.0)
MCHC: 33.1 g/dL (ref 32.0–36.0)
MCV: 91.9 fL (ref 80.0–100.0)
MPV: 10.1 fL (ref 7.5–12.5)
Monocytes Relative: 13.6 %
Neutro Abs: 2900 {cells}/uL (ref 1500–7800)
Neutrophils Relative %: 61.7 %
Platelets: 313 10*3/uL (ref 140–400)
RBC: 4.47 10*6/uL (ref 3.80–5.10)
RDW: 12.3 % (ref 11.0–15.0)
Total Lymphocyte: 21.7 %
WBC: 4.7 10*3/uL (ref 3.8–10.8)

## 2023-06-13 LAB — COMPREHENSIVE METABOLIC PANEL WITH GFR
AG Ratio: 2.1 (calc) (ref 1.0–2.5)
ALT: 21 U/L (ref 6–29)
AST: 23 U/L (ref 10–35)
Albumin: 4.6 g/dL (ref 3.6–5.1)
Alkaline phosphatase (APISO): 58 U/L (ref 37–153)
BUN: 13 mg/dL (ref 7–25)
CO2: 28 mmol/L (ref 20–32)
Calcium: 9.6 mg/dL (ref 8.6–10.4)
Chloride: 101 mmol/L (ref 98–110)
Creat: 0.71 mg/dL (ref 0.50–1.05)
Globulin: 2.2 g/dL (ref 1.9–3.7)
Glucose, Bld: 96 mg/dL (ref 65–99)
Potassium: 4.4 mmol/L (ref 3.5–5.3)
Sodium: 139 mmol/L (ref 135–146)
Total Bilirubin: 0.5 mg/dL (ref 0.2–1.2)
Total Protein: 6.8 g/dL (ref 6.1–8.1)
eGFR: 96 mL/min/{1.73_m2} (ref >=60)

## 2023-06-13 LAB — VITAMIN D 25 HYDROXY (VIT D DEFICIENCY, FRACTURES): Vit D, 25-Hydroxy: 68 ng/mL (ref 30–100)

## 2023-06-13 NOTE — Progress Notes (Signed)
 CMP WNL Lipid panel WNL Vitamin D WNL.

## 2023-06-28 ENCOUNTER — Other Ambulatory Visit: Payer: Self-pay | Admitting: Rheumatology

## 2023-06-28 ENCOUNTER — Other Ambulatory Visit: Payer: Self-pay

## 2023-06-28 ENCOUNTER — Other Ambulatory Visit: Payer: Self-pay | Admitting: Physician Assistant

## 2023-06-28 ENCOUNTER — Other Ambulatory Visit (HOSPITAL_COMMUNITY): Payer: Self-pay

## 2023-06-28 DIAGNOSIS — M0579 Rheumatoid arthritis with rheumatoid factor of multiple sites without organ or systems involvement: Secondary | ICD-10-CM

## 2023-06-28 MED ORDER — ORENCIA CLICKJECT 125 MG/ML ~~LOC~~ SOAJ
125.0000 mg | SUBCUTANEOUS | 0 refills | Status: DC
  Filled 2023-06-28: qty 4, 28d supply, fill #0
  Filled 2023-07-25: qty 4, 28d supply, fill #1
  Filled 2023-09-02: qty 4, 28d supply, fill #2

## 2023-06-28 MED ORDER — LEFLUNOMIDE 20 MG PO TABS
20.0000 mg | ORAL_TABLET | Freq: Every day | ORAL | 0 refills | Status: DC
  Filled 2023-06-28: qty 90, 90d supply, fill #0

## 2023-06-28 NOTE — Telephone Encounter (Signed)
 Last Fill: 04/04/2023  Labs: 06/12/2023 CMP WNL Lipid panel WNL Vitamin D  WNL.  TB Gold: 11/23/2022 negative    Next Visit: 12/19/2023  Last Visit: 06/12/2023  [id number] arthritis involving multiple sites with positive rheumatoid factor (HCC) Erosive disease   Current Dose per office note on 06/12/2023: Orencia  125 mg subcutaneous injections once weekly   Okay to refill Orencia ?

## 2023-06-28 NOTE — Progress Notes (Signed)
 Specialty Pharmacy Refill Coordination Note  Kara Campbell is a 64 y.o. female contacted today regarding refills of specialty medication(s) Abatacept  (Orencia  ClickJect)   Patient requested [name] at [facility] at [place] date: 07/04/23   Medication will be filled on 07/03/23.

## 2023-06-28 NOTE — Progress Notes (Signed)
 Specialty Pharmacy Ongoing Clinical Assessment Note  Kara Campbell is a 63 y.o. female who is being followed by the specialty pharmacy service for RxSp Rheumatoid Arthritis   Patient's specialty medication(s) reviewed today: Abatacept  (Orencia  ClickJect)   Missed doses in the last 4 weeks: 1   Patient/Caregiver did not have any additional questions or concerns.   Therapeutic benefit summary: Patient is achieving benefit   Adverse events/side effects summary: No adverse events/side effects   Patient's therapy is appropriate to: Continue    Goals Addressed             This Visit's Progress    Reduce signs and symptoms   On track    Patient is on track. Patient will maintain adherence         Follow up:  6 months  Eva Vallee M Layth Cerezo Specialty Pharmacist

## 2023-06-28 NOTE — Telephone Encounter (Signed)
 Last Fill: 05/30/2023 (30 day supply)   Labs: 06/12/2023 CMP WNL Lipid panel WNL Vitamin D  WNL.   Next Visit: 12/19/2023   Last Visit: 06/12/2023   [id number] arthritis involving multiple sites with positive rheumatoid factor (HCC) Erosive disease    Current Dose per office note on 06/12/2023: Arava  20 mg by mouth daily.    Okay to refill Arava ?

## 2023-07-03 ENCOUNTER — Other Ambulatory Visit: Payer: Self-pay

## 2023-07-25 ENCOUNTER — Other Ambulatory Visit: Payer: Self-pay

## 2023-07-25 NOTE — Progress Notes (Signed)
 Specialty Pharmacy Refill Coordination Note  Kara Campbell is a 63 y.o. female contacted today regarding refills of specialty medication(s) Abatacept  (Orencia  ClickJect)   Patient requested [name] at [facility] at [place] date: 07/30/23   Medication will be filled on 07/30/23.

## 2023-07-26 ENCOUNTER — Other Ambulatory Visit: Payer: Self-pay

## 2023-07-30 ENCOUNTER — Other Ambulatory Visit: Payer: Self-pay

## 2023-08-14 ENCOUNTER — Other Ambulatory Visit (HOSPITAL_COMMUNITY): Payer: Self-pay

## 2023-08-15 ENCOUNTER — Other Ambulatory Visit: Payer: Self-pay

## 2023-08-15 ENCOUNTER — Other Ambulatory Visit (HOSPITAL_COMMUNITY): Payer: Self-pay

## 2023-08-15 MED ORDER — VALSARTAN-HYDROCHLOROTHIAZIDE 320-25 MG PO TABS
1.0000 | ORAL_TABLET | Freq: Every day | ORAL | 0 refills | Status: DC
  Filled 2023-08-15: qty 30, 30d supply, fill #0

## 2023-08-26 ENCOUNTER — Other Ambulatory Visit (HOSPITAL_COMMUNITY): Payer: Self-pay

## 2023-08-26 DIAGNOSIS — F411 Generalized anxiety disorder: Secondary | ICD-10-CM | POA: Diagnosis not present

## 2023-08-26 DIAGNOSIS — E559 Vitamin D deficiency, unspecified: Secondary | ICD-10-CM | POA: Diagnosis not present

## 2023-08-26 DIAGNOSIS — R5383 Other fatigue: Secondary | ICD-10-CM | POA: Diagnosis not present

## 2023-08-26 DIAGNOSIS — F331 Major depressive disorder, recurrent, moderate: Secondary | ICD-10-CM | POA: Diagnosis not present

## 2023-08-26 DIAGNOSIS — G4700 Insomnia, unspecified: Secondary | ICD-10-CM | POA: Diagnosis not present

## 2023-08-26 DIAGNOSIS — I10 Essential (primary) hypertension: Secondary | ICD-10-CM | POA: Diagnosis not present

## 2023-08-26 MED ORDER — PAROXETINE HCL 20 MG PO TABS
20.0000 mg | ORAL_TABLET | Freq: Every morning | ORAL | 3 refills | Status: AC
  Filled 2023-08-26: qty 90, 90d supply, fill #0
  Filled 2023-11-26: qty 90, 90d supply, fill #1
  Filled 2024-03-03: qty 90, 90d supply, fill #2

## 2023-08-26 MED ORDER — VALSARTAN-HYDROCHLOROTHIAZIDE 320-25 MG PO TABS
1.0000 | ORAL_TABLET | Freq: Every day | ORAL | 0 refills | Status: DC
  Filled 2023-08-26 – 2023-09-12 (×3): qty 90, 90d supply, fill #0

## 2023-08-30 ENCOUNTER — Other Ambulatory Visit: Payer: Self-pay

## 2023-09-02 ENCOUNTER — Other Ambulatory Visit: Payer: Self-pay

## 2023-09-02 NOTE — Progress Notes (Signed)
 Specialty Pharmacy Refill Coordination Note  Kara Campbell is a 63 y.o. female contacted today regarding refills of specialty medication(s) Abatacept  (Orencia  ClickJect)   Patient requested [name] at [facility] at [place] date: 09/04/23   Medication will be filled on 06.24.25.

## 2023-09-11 ENCOUNTER — Other Ambulatory Visit (HOSPITAL_COMMUNITY): Payer: Self-pay

## 2023-09-12 ENCOUNTER — Other Ambulatory Visit (HOSPITAL_COMMUNITY): Payer: Self-pay

## 2023-09-12 ENCOUNTER — Other Ambulatory Visit: Payer: Self-pay

## 2023-09-24 ENCOUNTER — Other Ambulatory Visit (HOSPITAL_COMMUNITY): Payer: Self-pay

## 2023-09-24 ENCOUNTER — Other Ambulatory Visit: Payer: Self-pay

## 2023-09-24 ENCOUNTER — Other Ambulatory Visit: Payer: Self-pay | Admitting: Physician Assistant

## 2023-09-24 DIAGNOSIS — M0579 Rheumatoid arthritis with rheumatoid factor of multiple sites without organ or systems involvement: Secondary | ICD-10-CM

## 2023-09-24 NOTE — Telephone Encounter (Signed)
 Last Fill: 06/28/2023  Labs: 06/12/2023 CMP WNL  Lipid panel WNL  Vitamin D  WNL.   Contacted patient to update labs, she stated she would be in Monday on her next day off.   TB Gold: 11/23/2023 TB Gold Negative   Next Visit: 12/19/2023  Last Visit: 06/12/2023  DX: Rheumatoid arthritis involving multiple sites with positive rheumatoid factor   Current Dose per office note 06/12/2023:  Orencia  125 mg subcutaneous injections once weekly   Okay to refill Orencia ?

## 2023-09-25 ENCOUNTER — Other Ambulatory Visit: Payer: Self-pay

## 2023-09-25 MED ORDER — ORENCIA CLICKJECT 125 MG/ML ~~LOC~~ SOAJ
125.0000 mg | SUBCUTANEOUS | 0 refills | Status: DC
  Filled 2023-09-25: qty 12, 84d supply, fill #0
  Filled 2023-09-26: qty 4, 28d supply, fill #0
  Filled 2023-10-25: qty 4, 28d supply, fill #1
  Filled 2023-11-26: qty 4, 28d supply, fill #2

## 2023-09-26 ENCOUNTER — Other Ambulatory Visit: Payer: Self-pay

## 2023-09-26 ENCOUNTER — Other Ambulatory Visit: Payer: Self-pay | Admitting: Pharmacy Technician

## 2023-09-26 ENCOUNTER — Other Ambulatory Visit (HOSPITAL_COMMUNITY): Payer: Self-pay

## 2023-09-26 NOTE — Progress Notes (Signed)
 Specialty Pharmacy Refill Coordination Note  Kara Campbell is a 63 y.o. female contacted today regarding refills of specialty medication(s) Abatacept  (Orencia  ClickJect)   Patient requested [name] at [facility] at [place] date: 09/30/23   Medication will be filled on 09/29/23.

## 2023-09-30 ENCOUNTER — Other Ambulatory Visit: Payer: Self-pay | Admitting: *Deleted

## 2023-09-30 DIAGNOSIS — Z79899 Other long term (current) drug therapy: Secondary | ICD-10-CM | POA: Diagnosis not present

## 2023-10-01 ENCOUNTER — Ambulatory Visit: Payer: Self-pay | Admitting: Physician Assistant

## 2023-10-01 LAB — COMPREHENSIVE METABOLIC PANEL WITH GFR
AG Ratio: 2 (calc) (ref 1.0–2.5)
ALT: 30 U/L — ABNORMAL HIGH (ref 6–29)
AST: 27 U/L (ref 10–35)
Albumin: 4.3 g/dL (ref 3.6–5.1)
Alkaline phosphatase (APISO): 61 U/L (ref 37–153)
BUN: 12 mg/dL (ref 7–25)
CO2: 28 mmol/L (ref 20–32)
Calcium: 9.4 mg/dL (ref 8.6–10.4)
Chloride: 101 mmol/L (ref 98–110)
Creat: 0.66 mg/dL (ref 0.50–1.05)
Globulin: 2.1 g/dL (ref 1.9–3.7)
Glucose, Bld: 93 mg/dL (ref 65–99)
Potassium: 4.4 mmol/L (ref 3.5–5.3)
Sodium: 139 mmol/L (ref 135–146)
Total Bilirubin: 0.6 mg/dL (ref 0.2–1.2)
Total Protein: 6.4 g/dL (ref 6.1–8.1)
eGFR: 99 mL/min/1.73m2 (ref >=60)

## 2023-10-01 LAB — CBC WITH DIFFERENTIAL/PLATELET
Absolute Lymphocytes: 1112 {cells}/uL (ref 850–3900)
Absolute Monocytes: 536 {cells}/uL (ref 200–950)
Basophils Absolute: 41 {cells}/uL (ref 0–200)
Basophils Relative: 0.9 %
Eosinophils Absolute: 122 {cells}/uL (ref 15–500)
Eosinophils Relative: 2.7 %
HCT: 39.5 % (ref 35.0–45.0)
Hemoglobin: 12.8 g/dL (ref 11.7–15.5)
MCH: 30.4 pg (ref 27.0–33.0)
MCHC: 32.4 g/dL (ref 32.0–36.0)
MCV: 93.8 fL (ref 80.0–100.0)
MPV: 10 fL (ref 7.5–12.5)
Monocytes Relative: 11.9 %
Neutro Abs: 2691 {cells}/uL (ref 1500–7800)
Neutrophils Relative %: 59.8 %
Platelets: 297 Thousand/uL (ref 140–400)
RBC: 4.21 Million/uL (ref 3.80–5.10)
RDW: 12.5 % (ref 11.0–15.0)
Total Lymphocyte: 24.7 %
WBC: 4.5 Thousand/uL (ref 3.8–10.8)

## 2023-10-01 NOTE — Progress Notes (Signed)
 CBC WNL ALT is borderline elevated-30. Rest of CMP WNL.

## 2023-10-05 ENCOUNTER — Other Ambulatory Visit (HOSPITAL_COMMUNITY): Payer: Self-pay

## 2023-10-05 ENCOUNTER — Other Ambulatory Visit: Payer: Self-pay | Admitting: Physician Assistant

## 2023-10-07 ENCOUNTER — Other Ambulatory Visit: Payer: Self-pay

## 2023-10-07 ENCOUNTER — Other Ambulatory Visit (HOSPITAL_COMMUNITY): Payer: Self-pay

## 2023-10-07 DIAGNOSIS — E559 Vitamin D deficiency, unspecified: Secondary | ICD-10-CM | POA: Diagnosis not present

## 2023-10-07 DIAGNOSIS — I10 Essential (primary) hypertension: Secondary | ICD-10-CM | POA: Diagnosis not present

## 2023-10-07 MED ORDER — VALSARTAN 320 MG PO TABS
320.0000 mg | ORAL_TABLET | Freq: Every evening | ORAL | 3 refills | Status: AC
  Filled 2023-10-07: qty 90, 90d supply, fill #0
  Filled 2024-01-20: qty 90, 90d supply, fill #1

## 2023-10-07 MED ORDER — HYDROCHLOROTHIAZIDE 25 MG PO TABS
25.0000 mg | ORAL_TABLET | Freq: Every morning | ORAL | 3 refills | Status: AC
  Filled 2023-10-07: qty 90, 90d supply, fill #0
  Filled 2024-01-06: qty 90, 90d supply, fill #1
  Filled 2024-04-08: qty 90, 90d supply, fill #2

## 2023-10-07 MED ORDER — LEFLUNOMIDE 20 MG PO TABS
20.0000 mg | ORAL_TABLET | Freq: Every day | ORAL | 0 refills | Status: DC
  Filled 2023-10-07: qty 90, 90d supply, fill #0

## 2023-10-07 NOTE — Telephone Encounter (Signed)
 Last Fill: 06/28/2023  Labs: 09/30/2023 CBC WNL ALT is borderline elevated-30. Rest of CMP WNL.    Next Visit: 12/19/2023  Last Visit: 06/12/2023  DX: Rheumatoid arthritis involving multiple sites with positive rheumatoid factor   Current Dose per office note 06/12/2023: Arava  20 mg by mouth daily.   Okay to refill Arava  ?

## 2023-10-25 ENCOUNTER — Other Ambulatory Visit: Payer: Self-pay

## 2023-10-25 ENCOUNTER — Other Ambulatory Visit (HOSPITAL_COMMUNITY): Payer: Self-pay

## 2023-10-25 NOTE — Progress Notes (Signed)
 Specialty Pharmacy Refill Coordination Note  Kara Campbell is a 63 y.o. female contacted today regarding refills of specialty medication(s) Abatacept  (Orencia  ClickJect)   Patient requested [name] at [facility] at [place] date: 11/04/23   Medication will be filled on 11/01/23.

## 2023-10-31 ENCOUNTER — Other Ambulatory Visit: Payer: Self-pay

## 2023-10-31 ENCOUNTER — Other Ambulatory Visit (HOSPITAL_COMMUNITY): Payer: Self-pay

## 2023-11-26 ENCOUNTER — Other Ambulatory Visit: Payer: Self-pay

## 2023-11-26 ENCOUNTER — Other Ambulatory Visit (HOSPITAL_COMMUNITY): Payer: Self-pay

## 2023-11-26 NOTE — Progress Notes (Signed)
 Specialty Pharmacy Refill Coordination Note  Kara Campbell is a 63 y.o. female contacted today regarding refills of specialty medication(s) Abatacept  (Orencia  ClickJect)   Patient requested [name] at [facility] at [place] date: 11/29/23   Medication will be filled on 11/28/23.

## 2023-11-27 ENCOUNTER — Other Ambulatory Visit: Payer: Self-pay

## 2023-12-05 NOTE — Progress Notes (Signed)
 Office Visit Note  Patient: Kara Campbell             Date of Birth: [birth year]           MRN: [id number]             PCP: Waylan Almarie SAUNDERS, MD Referring: Waylan Almarie SAUNDERS, MD Visit Date: 12/19/2023 Occupation: [facility]  Subjective:  Pain in hands and feet  History of Present Illness: Kara Campbell is a 63 y.o. female with seropositive rheumatoid arthritis and osteoarthritis overlap.  She states she notices some discomfort in her hands especially her right second MCP joint and her both feet after working long shifts.  She works as a Educational psychologist.  She describes pain mostly over her hammertoes.  She denies any history of joint swelling.  She has been taking Orencia  125 mg subcu weekly and leflunomide  20 mg daily.  She denies any interruption in the treatment.    Activities of Daily Living:  Patient reports morning stiffness for 0 minute.   Patient Denies nocturnal pain.  Difficulty dressing/grooming: Denies Difficulty climbing stairs: Denies Difficulty getting out of chair: Denies Difficulty using hands for taps, buttons, cutlery, and/or writing: Denies  Review of Systems  Constitutional:  Positive for fatigue.  HENT:  Negative for mouth sores and mouth dryness.   Eyes:  Negative for dryness.  Respiratory:  Negative for shortness of breath.   Cardiovascular:  Negative for chest pain and palpitations.  Gastrointestinal:  Negative for blood in stool, constipation and diarrhea.  Endocrine: Negative for increased urination.  Genitourinary:  Negative for involuntary urination.  Musculoskeletal:  Positive for joint pain and joint pain. Negative for gait problem, joint swelling, myalgias, muscle weakness, morning stiffness, muscle tenderness and myalgias.  Skin:  Negative for color change, rash, hair loss and sensitivity to sunlight.  Allergic/Immunologic: Negative for susceptible to infections.  Neurological:  Negative for dizziness and headaches.  Hematological:   Negative for swollen glands.  Psychiatric/Behavioral:  Negative for depressed mood and sleep disturbance. The patient is not nervous/anxious.     PMFS History:  Patient Active Problem List   Diagnosis Date Noted   History of hypertension 10/16/2016   Essential hypertension 09/11/2016   Rheumatoid arthritis involving multiple sites with positive rheumatoid factor (HCC) 01/23/2016   High risk medication use 01/23/2016   Primary osteoarthritis of both hands 01/23/2016   Primary osteoarthritis of both feet 01/23/2016   Malignant melanoma (HCC) 01/23/2016   Basal cell carcinoma 01/23/2016   Anxiety 01/23/2016   Chest congestion 06/07/2015    Past Medical History:  Diagnosis Date   Arthritis    COVID-19    Melanoma (HCC) rhe   Rheumatoid arthritis(714.0)     Family History  Problem Relation Age of Onset   Cancer Mother        breast   Breast cancer Mother 66   Rheum arthritis Other    Past Surgical History:  Procedure Laterality Date   CESAREAN SECTION     INJECTION KNEE Right    Fluid removed from knee x2    MELANOMA EXCISION     SKIN BIOPSY     back of right leg, right buttocks, right chest, left cheek    Social History   Tobacco Use   Smoking status: Never    Passive exposure: Past   Smokeless tobacco: Never  Vaping Use   Vaping status: Never Used  Substance Use Topics   Alcohol use: Yes    Comment:  social   Drug use: No   Social History   Social History Narrative   Not on file     Immunization History  Administered Date(s) Administered   PFIZER(Purple Top)SARS-COV-2 Vaccination 03/01/2019, 03/22/2019, 11/24/2019     Objective: Vital Signs: BP 124/83   Pulse 60   Temp 98 F (36.7 C)   Resp 14   Ht 5' 4 (1.626 m)   Wt 165 lb 3.2 oz (74.9 kg)   BMI 28.36 kg/m    Physical Exam Vitals and nursing note reviewed.  Constitutional:      Appearance: She is well-developed.  HENT:     Head: Normocephalic and atraumatic.  Eyes:      Conjunctiva/sclera: Conjunctivae normal.  Cardiovascular:     Rate and Rhythm: Normal rate and regular rhythm.     Heart sounds: Normal heart sounds.  Pulmonary:     Effort: Pulmonary effort is normal.     Breath sounds: Normal breath sounds.  Abdominal:     General: Bowel sounds are normal.     Palpations: Abdomen is soft.  Musculoskeletal:     Cervical back: Normal range of motion.  Lymphadenopathy:     Cervical: No cervical adenopathy.  Skin:    General: Skin is warm and dry.     Capillary Refill: Capillary refill takes less than 2 seconds.  Neurological:     Mental Status: She is alert and oriented to person, place, and time.  Psychiatric:        Behavior: Behavior normal.      Musculoskeletal Exam: Cervical, thoracic and lumbar spine were in good range of motion.  There was no SI joint tenderness.  Shoulder joints, elbow joints, wrist joints, MCPs, PIPs and DIPs were in good range of motion with no synovitis.  She had thickening of the bilateral 2nd and 3rd MCP joints and all DIP and PIP joints.  Hip joints and knee joints were in good range of motion without any warmth swelling or effusion.  There was no tenderness over ankles or MTPs.  She had bilateral pes cavus and hammertoes.   CDAI Exam: CDAI Score: -- Patient Global: --; Provider Global: -- Swollen: --; Tender: -- Joint Exam 12/19/2023   No joint exam has been documented for this visit   There is currently no information documented on the homunculus. Go to the Rheumatology activity and complete the homunculus joint exam.  Investigation: No additional findings.  Imaging: No results found.  Recent Labs: Lab Results  Component Value Date   WBC 4.5 09/30/2023   HGB 12.8 09/30/2023   PLT 297 09/30/2023   NA 139 09/30/2023   K 4.4 09/30/2023   CL 101 09/30/2023   CO2 28 09/30/2023   GLUCOSE 93 09/30/2023   BUN 12 09/30/2023   CREATININE 0.66 09/30/2023   BILITOT 0.6 09/30/2023   ALKPHOS 59 10/23/2016    AST 27 09/30/2023   ALT 30 (H) 09/30/2023   PROT 6.4 09/30/2023   ALBUMIN 4.2 10/23/2016   CALCIUM 9.4 09/30/2023   GFRAA 112 07/28/2020   QFTBGOLDPLUS NEGATIVE 11/23/2022    Speciality Comments: PLQ Eye exam: 04/13/19 WNL @ [facility] Follow up in 1 year Orencia  started October 2021.  Plaquenil  discontinued due to improvement  Procedures:  No procedures performed Allergies: Amoxicillin   Assessment / Plan:     Visit Diagnoses: Rheumatoid arthritis involving multiple sites with positive rheumatoid factor (HCC) - Erosive disease.  She has been doing well on the combination  of Orencia  and leflunomide .  She denies any interruption in the treatment.  She noticed some discomfort in her hands especially the right second MCP joint and also in her feet after long shifts.  She has not noticed any joint inflammation.  High risk medication use - Orencia  125 mg subcutaneous injections once weekly (started 12/18/2019) and Arava  20 mg by mouth daily.  (Plaquenil  discontinued March 2022-no improvement). -September 30, 2023 CBC was normal.  CMP showed ALT of 30.  TB Gold was negative on November 23, 2022.  Will repeat labs today.  If her ALT remains elevated we can reduce the dose of leflunomide  to 10 mg p.o. daily.  Plan: CBC with Differential/Platelet, Comprehensive metabolic panel with GFR, QuantiFERON-TB Gold Plus.  Information reimmunization was placed in the AVS.  She was advised to hold Orencia  and Arava  if she develops an infection resume after the infection resolves.  She has been getting annual skin examination through her dermatologist.  Elevated LFTs-LFTs were mildly elevated.  Will repeat LFTs today.  If her LFTs remain elevated we may reduce the dose of her liver.  I also discussed possibly getting ultrasound of the liver to look for fatty liver if her liver functions remain elevated.  Primary osteoarthritis of both hands-she has osteoarthritis and rheumatoid arthritis overlap with bilateral  DIP thickening and CMC involvement.  Joint protection muscle strengthening was discussed.  Primary osteoarthritis of both feet-she has osteoarthritis in her bilateral feet which causes discomfort.  Pes cavus of both feet-she has bilateral severe pes cavus and hammertoes.  I advised again to schedule an appointment with a podiatrist for orthotics.  Osteopenia of multiple sites - DEXA 02/25/20: LFN BMD 0.707 with T-score -1.3.  She has had an updated DEXA ordered by gynecology.  Patient states she gets repeat DEXA scan through her GYN.  She is on calcium and vitamin D .  History of melanoma - Under care of dermatology.  She is followed by dermatology closely.  History of squamous cell carcinoma - Under care of dermatology  History of basal cell cancer - Under care of [facility].  History of hypertension-blood pressure was normal at 124/83.  Lipid panel was normal on June 12, 2023.  Vitamin D  deficiency-vitamin D  was normal at 61 on June 12, 2023.  Anxiety  Orders: Orders Placed This Encounter  Procedures   CBC with Differential/Platelet   Comprehensive metabolic panel with GFR   QuantiFERON-TB Gold Plus   No orders of the defined types were placed in this encounter.    Follow-Up Instructions: Return in about 5 months (around 05/18/2024) for Rheumatoid arthritis, Osteoarthritis.   Maya Nash, MD  Note - This record has been created using Animal nutritionist.  Chart creation errors have been sought, but may not always  have been located. Such creation errors do not reflect on  the standard of medical care.

## 2023-12-11 ENCOUNTER — Other Ambulatory Visit: Payer: Self-pay

## 2023-12-19 ENCOUNTER — Encounter: Payer: Self-pay | Admitting: Rheumatology

## 2023-12-19 ENCOUNTER — Ambulatory Visit: Attending: Rheumatology | Admitting: Rheumatology

## 2023-12-19 VITALS — BP 124/83 | HR 60 | Temp 98.0°F | Resp 14 | Ht 64.0 in | Wt 165.2 lb

## 2023-12-19 DIAGNOSIS — E559 Vitamin D deficiency, unspecified: Secondary | ICD-10-CM

## 2023-12-19 DIAGNOSIS — Z85820 Personal history of malignant melanoma of skin: Secondary | ICD-10-CM

## 2023-12-19 DIAGNOSIS — Q6671 Congenital pes cavus, right foot: Secondary | ICD-10-CM | POA: Diagnosis not present

## 2023-12-19 DIAGNOSIS — Z8679 Personal history of other diseases of the circulatory system: Secondary | ICD-10-CM

## 2023-12-19 DIAGNOSIS — M0579 Rheumatoid arthritis with rheumatoid factor of multiple sites without organ or systems involvement: Secondary | ICD-10-CM

## 2023-12-19 DIAGNOSIS — Z79899 Other long term (current) drug therapy: Secondary | ICD-10-CM

## 2023-12-19 DIAGNOSIS — Z8589 Personal history of malignant neoplasm of other organs and systems: Secondary | ICD-10-CM

## 2023-12-19 DIAGNOSIS — M8589 Other specified disorders of bone density and structure, multiple sites: Secondary | ICD-10-CM | POA: Diagnosis not present

## 2023-12-19 DIAGNOSIS — M19041 Primary osteoarthritis, right hand: Secondary | ICD-10-CM

## 2023-12-19 DIAGNOSIS — Z85828 Personal history of other malignant neoplasm of skin: Secondary | ICD-10-CM

## 2023-12-19 DIAGNOSIS — M19072 Primary osteoarthritis, left ankle and foot: Secondary | ICD-10-CM

## 2023-12-19 DIAGNOSIS — M19071 Primary osteoarthritis, right ankle and foot: Secondary | ICD-10-CM

## 2023-12-19 DIAGNOSIS — F419 Anxiety disorder, unspecified: Secondary | ICD-10-CM

## 2023-12-19 DIAGNOSIS — R7989 Other specified abnormal findings of blood chemistry: Secondary | ICD-10-CM

## 2023-12-19 DIAGNOSIS — Q6672 Congenital pes cavus, left foot: Secondary | ICD-10-CM

## 2023-12-19 DIAGNOSIS — M19042 Primary osteoarthritis, left hand: Secondary | ICD-10-CM

## 2023-12-19 NOTE — Patient Instructions (Signed)
 Standing Labs We placed an order today for your standing lab work.   Please have your standing labs drawn in  January and every 3 months  Please have your labs drawn 2 weeks prior to your appointment so that the provider can discuss your lab results at your appointment, if possible.  Please note that you may see your imaging and lab results in MyChart before we have reviewed them. We will contact you once all results are reviewed. Please allow our office up to 72 hours to thoroughly review all of the results before contacting the office for clarification of your results.  WALK-IN LAB HOURS  Monday through Thursday from 8:00 am -12:30 pm and 1:00 pm-4:30 pm and Friday from 8:00 am-12:00 pm.  Patients with office visits requiring labs will be seen before walk-in labs.  You may encounter longer than normal wait times. Please allow additional time. Wait times may be shorter on  Monday and Thursday afternoons.  We do not book appointments for walk-in labs. We appreciate your patience and understanding with our staff.   Labs are drawn by [facility]. Please bring your co-pay at the time of your lab draw.  You may receive a bill from [facility] for your lab work.  Please note if you are on Hydroxychloroquine  and and an order has been placed for a Hydroxychloroquine  level,  you will need to have it drawn 4 hours or more after your last dose.  If you wish to have your labs drawn at another location, please call the office 24 hours in advance so we can fax the orders.  The office is located at [address], [address], [place], [place] [address]   If you have any questions regarding directions or hours of operation,  please call [phone].   As a reminder, please drink plenty of water prior to coming for your lab work. Thanks!   Vaccines You are taking a medication(s) that can suppress your immune system.  The following immunizations are recommended: Flu annually Covid-19  RSV Td/Tdap (tetanus,  diphtheria, pertussis) every 10 years Pneumonia (Prevnar 15 then Pneumovax 23 at least 1 year apart.  Alternatively, can take Prevnar 20 without needing additional dose) Shingrix: 2 doses from 4 weeks to 6 months apart  Please check with your PCP to make sure you are up to date.   If you have signs or symptoms of an infection or start antibiotics: First, call your PCP for workup of your infection. Hold your medication through the infection, until you complete your antibiotics, and until symptoms resolve if you take the following: Injectable medication (Actemra, Benlysta, Cimzia, Cosentyx, Enbrel, Humira, Kevzara, Orencia , Remicade, Simponi, Stelara, Taltz, Tremfya) Methotrexate Leflunomide  (Arava ) Mycophenolate (Cellcept) Earma, Olumiant, or Rinvoq

## 2023-12-20 ENCOUNTER — Ambulatory Visit: Payer: Self-pay | Admitting: Rheumatology

## 2023-12-20 NOTE — Progress Notes (Signed)
 CBC normal, CMP shows mildly elevated LFTs.  Patient should avoid all NSAIDs and alcohol use.  We will continue to monitor labs every 3 months.

## 2023-12-21 LAB — COMPREHENSIVE METABOLIC PANEL WITH GFR
AG Ratio: 2 (calc) (ref 1.0–2.5)
ALT: 35 U/L — ABNORMAL HIGH (ref 6–29)
AST: 26 U/L (ref 10–35)
Albumin: 4.5 g/dL (ref 3.6–5.1)
Alkaline phosphatase (APISO): 60 U/L (ref 37–153)
BUN: 13 mg/dL (ref 7–25)
CO2: 29 mmol/L (ref 20–32)
Calcium: 9.7 mg/dL (ref 8.6–10.4)
Chloride: 103 mmol/L (ref 98–110)
Creat: 0.7 mg/dL (ref 0.50–1.05)
Globulin: 2.3 g/dL (ref 1.9–3.7)
Glucose, Bld: 94 mg/dL (ref 65–99)
Potassium: 4.2 mmol/L (ref 3.5–5.3)
Sodium: 140 mmol/L (ref 135–146)
Total Bilirubin: 0.6 mg/dL (ref 0.2–1.2)
Total Protein: 6.8 g/dL (ref 6.1–8.1)
eGFR: 97 mL/min/1.73m2 (ref >=60)

## 2023-12-21 LAB — CBC WITH DIFFERENTIAL/PLATELET
Absolute Lymphocytes: 1224 {cells}/uL (ref 850–3900)
Absolute Monocytes: 617 {cells}/uL (ref 200–950)
Basophils Absolute: 51 {cells}/uL (ref 0–200)
Basophils Relative: 1 %
Eosinophils Absolute: 138 {cells}/uL (ref 15–500)
Eosinophils Relative: 2.7 %
HCT: 41.6 % (ref 35.0–45.0)
Hemoglobin: 13.7 g/dL (ref 11.7–15.5)
MCH: 30.7 pg (ref 27.0–33.0)
MCHC: 32.9 g/dL (ref 32.0–36.0)
MCV: 93.3 fL (ref 80.0–100.0)
MPV: 10.1 fL (ref 7.5–12.5)
Monocytes Relative: 12.1 %
Neutro Abs: 3070 {cells}/uL (ref 1500–7800)
Neutrophils Relative %: 60.2 %
Platelets: 335 Thousand/uL (ref 140–400)
RBC: 4.46 Million/uL (ref 3.80–5.10)
RDW: 12.1 % (ref 11.0–15.0)
Total Lymphocyte: 24 %
WBC: 5.1 Thousand/uL (ref 3.8–10.8)

## 2023-12-21 LAB — QUANTIFERON-TB GOLD PLUS
Mitogen-NIL: 7.3 [IU]/mL
NIL: 0.01 [IU]/mL
QuantiFERON-TB Gold Plus: NEGATIVE
TB1-NIL: 0 [IU]/mL
TB2-NIL: 0 [IU]/mL

## 2023-12-22 NOTE — Progress Notes (Signed)
 ALT remains elevated, CBC normal, TB Gold negative.  Patient should avoid NSAIDs and alcohol use.  Please advise patient to reduce leflunomide  20 mg tablet, half tablet p.o. daily.  We will send a new prescription for leflunomide  10 mg p.o. daily when the prescription is due.  Will recheck labs in 3 months as scheduled.

## 2023-12-23 ENCOUNTER — Other Ambulatory Visit: Payer: Self-pay | Admitting: *Deleted

## 2023-12-23 MED ORDER — LEFLUNOMIDE 10 MG PO TABS
10.0000 mg | ORAL_TABLET | Freq: Every day | ORAL | Status: AC
Start: 2023-12-23 — End: ?

## 2023-12-24 ENCOUNTER — Other Ambulatory Visit: Payer: Self-pay

## 2023-12-24 ENCOUNTER — Other Ambulatory Visit: Payer: Self-pay | Admitting: Physician Assistant

## 2023-12-24 ENCOUNTER — Other Ambulatory Visit: Payer: Self-pay | Admitting: Pharmacy Technician

## 2023-12-24 DIAGNOSIS — M0579 Rheumatoid arthritis with rheumatoid factor of multiple sites without organ or systems involvement: Secondary | ICD-10-CM

## 2023-12-24 MED ORDER — ORENCIA CLICKJECT 125 MG/ML ~~LOC~~ SOAJ
125.0000 mg | SUBCUTANEOUS | 0 refills | Status: DC
  Filled 2023-12-24: qty 4, 28d supply, fill #0
  Filled 2024-01-16: qty 4, 28d supply, fill #1
  Filled 2024-02-14 – 2024-02-26 (×2): qty 4, 28d supply, fill #2

## 2023-12-24 NOTE — Telephone Encounter (Signed)
 Last Fill: 09/25/2023  Labs: 12/19/2023 ALT remains elevated, CBC normal,   TB Gold: 12/19/2023 Neg    Next Visit: 05/21/2024  Last Visit: 12/19/2023  IK:Myzlfjunpi arthritis involving multiple sites with positive rheumatoid factor   Current Dose per office note 12/19/2023: Orencia  125 mg subcutaneous injections once weekly   Okay to refill Orencia ?

## 2023-12-24 NOTE — Progress Notes (Signed)
 Specialty Pharmacy Refill Coordination Note  Kara Campbell is a 63 y.o. female contacted today regarding refills of specialty medication(s) Abatacept  (Orencia  ClickJect)   Patient requested [name] at [facility] at [place] date: 12/26/23   Medication will be filled on 12/25/23.  This fill date is pending response to refill request from provider. Patient is aware and if they have not received fill by intended date they must follow up with pharmacy.

## 2024-01-16 ENCOUNTER — Other Ambulatory Visit (HOSPITAL_COMMUNITY): Payer: Self-pay

## 2024-01-20 ENCOUNTER — Other Ambulatory Visit: Payer: Self-pay

## 2024-01-20 ENCOUNTER — Other Ambulatory Visit (HOSPITAL_COMMUNITY): Payer: Self-pay

## 2024-01-20 ENCOUNTER — Other Ambulatory Visit: Payer: Self-pay | Admitting: Physician Assistant

## 2024-01-20 MED ORDER — LEFLUNOMIDE 10 MG PO TABS
10.0000 mg | ORAL_TABLET | Freq: Every day | ORAL | 0 refills | Status: AC
  Filled 2024-01-20: qty 90, 90d supply, fill #0

## 2024-01-20 NOTE — Progress Notes (Signed)
 Specialty Pharmacy Refill Coordination Note  Kara Campbell is a 63 y.o. female contacted today regarding refills of specialty medication(s) Abatacept  (Orencia  ClickJect)   Patient requested Pickup at [facility] at [place] date: 01/22/24   Medication will be filled on: 01/21/24

## 2024-01-20 NOTE — Telephone Encounter (Signed)
 Last Fill: 02/26/2023  Labs: 12/19/2023 CBC normal, CMP shows mildly elevated LFTs   Next Visit: 05/21/2024  Last Visit: 12/19/2023  DX: Rheumatoid arthritis involving multiple sites with positive rheumatoid factor   Current Dose per office note 12/19/2023: leflunomide  10 mg p.o. daily   Okay to refill Arava  ?

## 2024-01-21 ENCOUNTER — Other Ambulatory Visit: Payer: Self-pay

## 2024-02-14 ENCOUNTER — Other Ambulatory Visit (HOSPITAL_COMMUNITY): Payer: Self-pay

## 2024-02-18 ENCOUNTER — Other Ambulatory Visit (HOSPITAL_COMMUNITY): Payer: Self-pay

## 2024-02-20 ENCOUNTER — Other Ambulatory Visit: Payer: Self-pay

## 2024-02-26 ENCOUNTER — Other Ambulatory Visit (HOSPITAL_COMMUNITY): Payer: Self-pay

## 2024-02-26 ENCOUNTER — Other Ambulatory Visit: Payer: Self-pay

## 2024-02-26 MED ORDER — CEFDINIR 300 MG PO CAPS
300.0000 mg | ORAL_CAPSULE | Freq: Two times a day (BID) | ORAL | 0 refills | Status: AC
  Filled 2024-02-26: qty 20, 10d supply, fill #0

## 2024-02-26 NOTE — Progress Notes (Signed)
 Specialty Pharmacy Refill Coordination Note  Spoke with Abri A Jahari Wiginton is a 63 y.o. female contacted today regarding refills of specialty medication(s) Abatacept  (Orencia  ClickJect)  Doses on hand: 0  Next inj: 03/01/24   Patient requested: Delivery   Delivery date: 02/28/24   Verified address: [address] [place] [address]  Medication will be filled on 02/27/24

## 2024-02-27 ENCOUNTER — Other Ambulatory Visit: Payer: Self-pay

## 2024-02-27 ENCOUNTER — Other Ambulatory Visit: Payer: Self-pay | Admitting: Pharmacist

## 2024-02-27 NOTE — Progress Notes (Signed)
 Clinical Intervention Note  Clinical Intervention Notes: Patient taking Cefdinir  for 10 days. No DDI's found with Orencia .   Clinical Intervention Outcomes: Prevention of an adverse drug event   Lyle LELON Chalk Specialty Pharmacist

## 2024-03-07 ENCOUNTER — Telehealth: Payer: Self-pay | Admitting: Rheumatology

## 2024-03-07 ENCOUNTER — Other Ambulatory Visit (HOSPITAL_COMMUNITY): Payer: Self-pay

## 2024-03-07 MED ORDER — OSELTAMIVIR PHOSPHATE 75 MG PO CAPS
75.0000 mg | ORAL_CAPSULE | Freq: Two times a day (BID) | ORAL | 0 refills | Status: AC
  Filled 2024-03-07: qty 10, 5d supply, fill #0

## 2024-03-07 NOTE — Telephone Encounter (Signed)
 I received a phone call from the patient.  She stated that for the last 2 weeks she had been feeling tired.  Last night she woke up with 101.3 F fever.  She was seen at the urgent care where she was diagnosed with flu.  She was started on Tamiflu .  She wanted to know about if she should continue Orencia  and leflunomide .  I advised her to hold off both medications until she becomes asymptomatic and then resume the medications.  Patient voiced understanding. Maya Nash, MD

## 2024-03-07 NOTE — Progress Notes (Signed)
 "  Subjective:   Chief Complaint  Patient presents with   congestion    Fever    101.3 last night    Sore Throat    Hard to swallow     History of Present Illness The patient presents for evaluation of acute onset of fever, TMAX 101.3 overnight accompanied by severe sore throat, nasal congestion and cough.  Patient notes having a subacute sinusitis about 2 weeks ago for which she completed a 10 day course of antibiotics. Notes she had improved but symptoms never resolved. Last night developed acute onset of fever, TMAX 101.3 and sore throat and now has noted increased cough this morning. Denies shortness of breath, N/V/D   Parts of patient history reviewed include PMH, problem list, medications, allergies, and social history.  Objective:   Vitals:   03/07/24 0918  BP: 144/86  BP Location: Left arm  Patient Position: Sitting  Pulse: 72  Resp: 16  Temp: 99.3 F (37.4 C)  TempSrc: Oral  SpO2: 98%  Weight: 75.3 kg (166 lb)  Height: 1.626 m (5' 4)    Physical Exam Vitals reviewed.  Constitutional:      Appearance: Normal appearance.  HENT:     Head: Normocephalic and atraumatic.     Right Ear: Tympanic membrane, ear canal and external ear normal.     Left Ear: Tympanic membrane, ear canal and external ear normal.     Nose: Congestion and rhinorrhea present.     Mouth/Throat:     Mouth: Mucous membranes are moist.     Pharynx: Posterior oropharyngeal erythema present. No oropharyngeal exudate.  Eyes:     Extraocular Movements: Extraocular movements intact.  Cardiovascular:     Rate and Rhythm: Normal rate and regular rhythm.     Pulses: Normal pulses.     Heart sounds: Normal heart sounds.  Pulmonary:     Effort: Pulmonary effort is normal.     Breath sounds: Normal breath sounds.  Musculoskeletal:        General: Normal range of motion.     Cervical back: Normal range of motion and neck supple.  Lymphadenopathy:     Cervical: No cervical adenopathy.   Skin:    General: Skin is warm and dry.  Neurological:     General: No focal deficit present.     Mental Status: She is alert and oriented to person, place, and time.            Assessment/Plan:   Infinity was seen today for congestion , fever and sore throat.  Diagnoses and all orders for this visit:  Influenza A  Fever, unspecified fever cause -     POC Influenza A&B NAT (IDNOW) -     POC SARS-COV-2 SYMPTOMATIC (IDNOW) -     POC Rapid Strep A (IDNOW)  Sore throat -     POC Influenza A&B NAT (IDNOW) -     POC SARS-COV-2 SYMPTOMATIC (IDNOW) -     POC Rapid Strep A (IDNOW)  Nasal congestion -     POC Influenza A&B NAT (IDNOW) -     POC SARS-COV-2 SYMPTOMATIC (IDNOW)  Other orders -     oseltamivir  (TAMIFLU ) 75 mg capsule; Take 1 capsule (75 mg total) by mouth 2 (two) times a day for 5 days.     Assessment & Plan Kara Campbell is a 63 y.o. female is here for acute onset of fever, sore throat, cough and congestion overnight in the setting of recent acute bacterial  sinusitis for which she has completed a course of antibiotics recently.  COVID negative, Positive for influenza A, Strep neg Lungs CTAB. O2 sat 98% on RA indicating excellent oxygenation. No evidence to suggest pneumonia. Exam reveals moderate pharyngeal erythema, though no exudates, strep test negative, feel strep unlikely. Having recently completed course of antibiotics, feel secondary/associated bacterial sinusitis/bronchitis unlikely. Overall clinical picture most c/w influenza. Will treat with tamiflu . Recommend supportive treatments with sudafed PRN nasal congestion, mucinex DM PRN cough and congestion, tylenol/motrin for aches/pain, aggressive hydration and rest. Current CDC isolation recommendations reviewed and provided to the patient in writing at time of discharge.  Patient will return for new or worsening symptoms. I discussed the findings today, diagnosis/differential diagnosis, plan and red flags that  require return for reevaluation with PCP, Urgent care or EMERGENCY. Patient was agreeable to outlined plan and questions were answered, felt stable for discharge.     All pertinent previous records reviewed prior to and during visit.   Home Care   Electronically signed by: Jon Earnie Flank, NP 03/07/2024 10:51 AM  "

## 2024-03-19 ENCOUNTER — Other Ambulatory Visit: Payer: Self-pay | Admitting: Rheumatology

## 2024-03-19 ENCOUNTER — Other Ambulatory Visit: Payer: Self-pay

## 2024-03-19 DIAGNOSIS — M0579 Rheumatoid arthritis with rheumatoid factor of multiple sites without organ or systems involvement: Secondary | ICD-10-CM

## 2024-03-19 MED ORDER — ORENCIA CLICKJECT 125 MG/ML ~~LOC~~ SOAJ
125.0000 mg | SUBCUTANEOUS | 0 refills | Status: AC
  Filled 2024-03-20 – 2024-04-10 (×3): qty 4, 28d supply, fill #0

## 2024-03-19 NOTE — Telephone Encounter (Signed)
 Last Fill: 12/24/2023  Labs: 12/19/2023 CBC normal, CMP shows mildly elevated LFTs.   TB Gold: 12/19/2023 Neg    Next Visit: 05/11/2024  Last Visit: 12/19/2023  IK:Myzlfjunpi arthritis involving multiple sites with positive rheumatoid factor   Current Dose per office note 12/19/2023: Orencia  125 mg subcutaneous injections once weekly   Left message to advise patient she is due to update labs.    Okay to refill Orencia ?

## 2024-03-20 ENCOUNTER — Other Ambulatory Visit: Payer: Self-pay

## 2024-03-20 ENCOUNTER — Other Ambulatory Visit (HOSPITAL_COMMUNITY): Payer: Self-pay

## 2024-03-21 ENCOUNTER — Other Ambulatory Visit (HOSPITAL_COMMUNITY): Payer: Self-pay

## 2024-03-21 MED ORDER — BENZONATATE 200 MG PO CAPS
200.0000 mg | ORAL_CAPSULE | Freq: Three times a day (TID) | ORAL | 0 refills | Status: AC | PRN
  Filled 2024-03-21: qty 20, 7d supply, fill #0

## 2024-03-21 MED ORDER — SULFAMETHOXAZOLE-TRIMETHOPRIM 800-160 MG PO TABS
1.0000 | ORAL_TABLET | Freq: Two times a day (BID) | ORAL | 0 refills | Status: AC
  Filled 2024-03-21: qty 14, 7d supply, fill #0

## 2024-03-25 ENCOUNTER — Other Ambulatory Visit: Payer: Self-pay

## 2024-03-27 ENCOUNTER — Other Ambulatory Visit (HOSPITAL_COMMUNITY): Payer: Self-pay

## 2024-03-27 ENCOUNTER — Other Ambulatory Visit: Payer: Self-pay

## 2024-03-27 MED ORDER — PREDNISONE 20 MG PO TABS
ORAL_TABLET | ORAL | 0 refills | Status: AC
  Filled 2024-03-27: qty 10, 5d supply, fill #0

## 2024-03-27 MED ORDER — PROMETHAZINE-DM 6.25-15 MG/5ML PO SYRP
ORAL_SOLUTION | ORAL | 0 refills | Status: AC
  Filled 2024-03-27: qty 180, 6d supply, fill #0

## 2024-03-27 MED ORDER — FLUTICASONE PROPIONATE 50 MCG/ACT NA SUSP
NASAL | 0 refills | Status: AC
  Filled 2024-03-27: qty 16, 60d supply, fill #0

## 2024-03-27 NOTE — Progress Notes (Signed)
 Specialty Pharmacy Ongoing Clinical Assessment Note  Kara Campbell is a 64 y.o. female who is being followed by the specialty pharmacy service for RxSp Rheumatoid Arthritis   Patient's specialty medication(s) reviewed today: Abatacept  (Orencia  ClickJect)   Missed doses in the last 4 weeks: 3   Patient/Caregiver did not have any additional questions or concerns.   Therapeutic benefit summary: Patient is achieving benefit   Adverse events/side effects summary: No adverse events/side effects   Patient's therapy is appropriate to: Continue    Goals Addressed             This Visit's Progress    Reduce signs and symptoms   On track    Patient is on track. Patient will maintain adherence         Follow up: 12 months  [facility]

## 2024-04-10 ENCOUNTER — Other Ambulatory Visit: Payer: Self-pay

## 2024-04-14 ENCOUNTER — Other Ambulatory Visit: Payer: Self-pay

## 2024-04-14 ENCOUNTER — Other Ambulatory Visit (HOSPITAL_COMMUNITY): Payer: Self-pay

## 2024-04-14 ENCOUNTER — Encounter: Payer: Self-pay | Admitting: Rheumatology

## 2024-04-14 ENCOUNTER — Other Ambulatory Visit: Payer: Self-pay | Admitting: Pharmacy Technician

## 2024-04-16 ENCOUNTER — Other Ambulatory Visit: Payer: Self-pay

## 2024-05-21 ENCOUNTER — Ambulatory Visit: Admitting: Physician Assistant
